# Patient Record
Sex: Male | Born: 1960
Health system: Southern US, Community
[De-identification: ages and names within clinical notes are randomized; demographics above are authoritative.]

## PROBLEM LIST (undated history)

## (undated) DIAGNOSIS — K921 Melena: Secondary | ICD-10-CM

## (undated) DIAGNOSIS — R918 Other nonspecific abnormal finding of lung field: Secondary | ICD-10-CM

## (undated) DIAGNOSIS — K219 Gastro-esophageal reflux disease without esophagitis: Secondary | ICD-10-CM

## (undated) DIAGNOSIS — R011 Cardiac murmur, unspecified: Secondary | ICD-10-CM

## (undated) DIAGNOSIS — R079 Chest pain, unspecified: Secondary | ICD-10-CM

## (undated) DIAGNOSIS — T7840XA Allergy, unspecified, initial encounter: Secondary | ICD-10-CM

## (undated) DIAGNOSIS — I10 Essential (primary) hypertension: Secondary | ICD-10-CM

## (undated) DIAGNOSIS — N182 Chronic kidney disease, stage 2 (mild): Secondary | ICD-10-CM

## (undated) HISTORY — DX: Other nonspecific abnormal finding of lung field: R91.8

## (undated) HISTORY — DX: Allergy, unspecified, initial encounter: T78.40XA

## (undated) HISTORY — DX: Chronic kidney disease, stage 2 (mild): N18.2

## (undated) HISTORY — DX: Chest pain, unspecified: R07.9

## (undated) HISTORY — DX: Cardiac murmur, unspecified: R01.1

## (undated) HISTORY — DX: Melena: K92.1

---

## 1998-05-24 HISTORY — PX: ANAL FISSURE REPAIR: SHX2312

## 2004-06-01 ENCOUNTER — Ambulatory Visit (HOSPITAL_COMMUNITY): Admission: RE | Admit: 2004-06-01 | Discharge: 2004-06-01 | Payer: Self-pay | Admitting: General Surgery

## 2004-06-01 ENCOUNTER — Ambulatory Visit (HOSPITAL_BASED_OUTPATIENT_CLINIC_OR_DEPARTMENT_OTHER): Admission: RE | Admit: 2004-06-01 | Discharge: 2004-06-01 | Payer: Self-pay | Admitting: General Surgery

## 2006-02-04 ENCOUNTER — Emergency Department: Payer: Self-pay | Admitting: Unknown Physician Specialty

## 2010-01-18 ENCOUNTER — Emergency Department: Payer: Self-pay | Admitting: Emergency Medicine

## 2011-04-23 ENCOUNTER — Emergency Department: Payer: Self-pay | Admitting: Emergency Medicine

## 2019-11-03 ENCOUNTER — Emergency Department (HOSPITAL_COMMUNITY): Payer: Commercial Managed Care - PPO

## 2019-11-03 ENCOUNTER — Emergency Department (HOSPITAL_COMMUNITY)
Admission: EM | Admit: 2019-11-03 | Discharge: 2019-11-04 | Disposition: A | Discharge status: Home / Self Care | Payer: Commercial Managed Care - PPO | Attending: Emergency Medicine | Admitting: Emergency Medicine

## 2019-11-03 ENCOUNTER — Encounter (HOSPITAL_COMMUNITY): Payer: Self-pay

## 2019-11-03 DIAGNOSIS — R202 Paresthesia of skin: Secondary | ICD-10-CM | POA: Insufficient documentation

## 2019-11-03 DIAGNOSIS — R072 Precordial pain: Secondary | ICD-10-CM

## 2019-11-03 DIAGNOSIS — R0602 Shortness of breath: Secondary | ICD-10-CM | POA: Insufficient documentation

## 2019-11-03 LAB — CBC
HCT: 53 % — ABNORMAL HIGH (ref 39.0–52.0)
Hemoglobin: 17.5 g/dL — ABNORMAL HIGH (ref 13.0–17.0)
MCH: 29.9 pg (ref 26.0–34.0)
MCHC: 33 g/dL (ref 30.0–36.0)
MCV: 90.4 fL (ref 80.0–100.0)
Platelets: 213 10*3/uL (ref 150–400)
RBC: 5.86 MIL/uL — ABNORMAL HIGH (ref 4.22–5.81)
RDW: 11.9 % (ref 11.5–15.5)
WBC: 6.9 10*3/uL (ref 4.0–10.5)
nRBC: 0 % (ref 0.0–0.2)

## 2019-11-03 LAB — BASIC METABOLIC PANEL
Anion gap: 9 (ref 5–15)
BUN: 10 mg/dL (ref 6–20)
CO2: 26 mmol/L (ref 22–32)
Calcium: 9.4 mg/dL (ref 8.9–10.3)
Chloride: 106 mmol/L (ref 98–111)
Creatinine, Ser: 1.35 mg/dL — ABNORMAL HIGH (ref 0.61–1.24)
GFR calc Af Amer: >60 mL/min (ref >60)
GFR calc non Af Amer: 57 mL/min — ABNORMAL LOW (ref >60)
Glucose, Bld: 98 mg/dL (ref 70–99)
Potassium: 4.2 mmol/L (ref 3.5–5.1)
Sodium: 141 mmol/L (ref 135–145)

## 2019-11-03 LAB — TROPONIN I (HIGH SENSITIVITY)
Troponin I (High Sensitivity): 13 ng/L (ref <18)
Troponin I (High Sensitivity): 22 ng/L — ABNORMAL HIGH (ref <18)
Troponin I (High Sensitivity): 18 ng/L — ABNORMAL HIGH (ref <18)

## 2019-11-03 NOTE — ED Triage Notes (Signed)
Pt c/o chest pain today while pt was working in the house. Pt states chest pain feels like pressure on the inside. Pt states left hand is tingling.

## 2019-11-04 ENCOUNTER — Ambulatory Visit: Payer: Commercial Managed Care - PPO | Admitting: Nurse Practitioner

## 2019-11-04 ENCOUNTER — Encounter: Payer: Self-pay | Admitting: Nurse Practitioner

## 2019-11-04 ENCOUNTER — Other Ambulatory Visit: Payer: Self-pay

## 2019-11-04 VITALS — BP 130/90 | HR 69 | Temp 98.6°F | Ht 66.35 in | Wt 193.0 lb

## 2019-11-04 DIAGNOSIS — Z125 Encounter for screening for malignant neoplasm of prostate: Secondary | ICD-10-CM

## 2019-11-04 DIAGNOSIS — Z6830 Body mass index (BMI) 30.0-30.9, adult: Secondary | ICD-10-CM

## 2019-11-04 DIAGNOSIS — Z1159 Encounter for screening for other viral diseases: Secondary | ICD-10-CM | POA: Insufficient documentation

## 2019-11-04 DIAGNOSIS — R079 Chest pain, unspecified: Secondary | ICD-10-CM | POA: Diagnosis not present

## 2019-11-04 DIAGNOSIS — Z720 Tobacco use: Secondary | ICD-10-CM | POA: Insufficient documentation

## 2019-11-04 DIAGNOSIS — R0602 Shortness of breath: Secondary | ICD-10-CM | POA: Insufficient documentation

## 2019-11-04 DIAGNOSIS — E669 Obesity, unspecified: Secondary | ICD-10-CM | POA: Insufficient documentation

## 2019-11-04 DIAGNOSIS — K219 Gastro-esophageal reflux disease without esophagitis: Secondary | ICD-10-CM | POA: Diagnosis not present

## 2019-11-04 DIAGNOSIS — K625 Hemorrhage of anus and rectum: Secondary | ICD-10-CM | POA: Insufficient documentation

## 2019-11-04 DIAGNOSIS — Z114 Encounter for screening for human immunodeficiency virus [HIV]: Secondary | ICD-10-CM | POA: Insufficient documentation

## 2019-11-04 DIAGNOSIS — Z0000 Encounter for general adult medical examination without abnormal findings: Secondary | ICD-10-CM | POA: Insufficient documentation

## 2019-11-04 MED ORDER — OMEPRAZOLE 20 MG PO CPDR: 20.0000 mg | DELAYED_RELEASE_CAPSULE | Freq: Every day | ORAL | 3 refills | Status: DC

## 2019-11-04 NOTE — Progress Notes (Signed)
New Patient Office Visit  Subjective:  Patient ID: Craig Finley, male    DOB: [birth year]  Age: 59 y.o. MRN: [id number]  CC:  Chief Complaint  Patient presents with  . New Patient (Initial Visit)    establish care    HPI Craig Finley is a 58 yo who presents to establish care with a primary care provider. He last had a routine exam with PCP over  20 years ago.  He recalls a history of GERD, anal fissure, mild intermittent constipation, and he was seen yesterday in the emergency department for new onset chest pain.  Chest pain: Yesterday, he was nailing a board and started to have diaphoresis, fatigue SOB and chest hurt. Kept nailing and it persisted. He knew this was unusual for him because he can build an entire deck and not break a sweat.  He was in the shade, but it was noon and very hot.  He was seen in the ED yesterday and the  EKG showed no acute abnormality. CXR was normal.  He had no significant climbing in troponin.  He was reporting minimal discomfort substernal area when he was working on his deck. He is non cigarette smoker. Patient was discharged home 81 mg aspirin and told to follow-up with Cardiology.  Today, he has no chest pain but left over vague tightness intermittent. No SOB but he is wearing a tight fitting  N95 mask and feels a little DOE when wearing this. Pulse ox 98%. Strong FH asthma, but he was never dx with it. 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   He takes Allegra D and Flonase in Feb to late August. He coughs when his allergies are active from spring until June. Summer he is good. It  returns in the fall and once a year had breathing treatment in the 1980's and 1990's.   GERD: He was dx years ago and took Nexium and Prilosec. He gets frequent HB and takes Tums x2 and in 2 hours later 2 more and it is gone. His HB is described as burning in epigastric and is non exertional.   BRBPR/mild  constipation: In military in 1982 he ad an anal ulcer and gave meds that shrunk it. He had anal fissure surgery 05/1998. He has seen BRBPR with B<M and drive trucks and holds off BM and gets a little constipated. No meds for  it. Eats cereal. Occasional rectal pain- today he had a BM- no constipation and soreness and a little bubble back there was present for a couple of weeks and resolved.   Past Medical History:  Diagnosis Date  . Allergy   . Blood in stool     Past Surgical History:  Procedure Laterality Date  . ANAL FISSURE REPAIR  05/1998   some type of anal fissure procedure    Family History  Problem Relation Age of Onset  . Heart disease Maternal Uncle   . Heart disease Maternal Uncle   . Arthritis Mother   . Asthma Mother   . Hypertension Mother   . Hearing loss Father   . Alcohol abuse Sister   . Cancer Sister   . Depression Sister   . Drug abuse Sister   . Early death Sister   . Heart attack Sister   . Asthma Daughter   . Asthma Son   . Alcohol abuse Maternal Grandmother   . Cancer Maternal Grandmother   . Cancer  Maternal Grandfather   . Hypertension Sister   . Miscarriages / Stillbirths Sister   . Asthma Sister   . Depression Sister     Social History   Socioeconomic History  . Marital status: Married    Spouse name: Not on file  . Number of children: Not on file  . Years of education: Not on file  . Highest education level: Some college, no degree  Occupational History  . Occupation: trucker  Tobacco Use  . Smoking status: Never Smoker  . Smokeless tobacco: Current User    Types: Snuff  . Tobacco comment: dipping x 25-30 years  Vaping Use  . Vaping Use: Never used  Substance and Sexual Activity  . Alcohol use: Yes    Comment: drinks 4-7 beers some days and none others. He drinks 24 12 oz can  beers per   . Drug use: Never  . Sexual activity: Yes  Other Topics Concern  . Not on file  Social History Narrative   Married with 5 kids and full  time trucker regional    Social Determinants of Health   Financial Resource Strain:   . Difficulty of Paying Living Expenses:   Food Insecurity:   . Worried About Programme researcher, broadcasting/film/video in the Last Year:   . Barista in the Last Year:   Transportation Needs:   . Freight forwarder (Medical):   Marland Kitchen Lack of Transportation (Non-Medical):   Physical Activity:   . Days of Exercise per Week:   . Minutes of Exercise per Session:   Stress:   . Feeling of Stress :   Social Connections:   . Frequency of Communication with Friends and Family:   . Frequency of Social Gatherings with Friends and Family:   . Attends Religious Services:   . Active Member of Clubs or Organizations:   . Attends Banker Meetings:   Marland Kitchen Marital Status:   Intimate Partner Violence:   . Fear of Current or Ex-Partner:   . Emotionally Abused:   Marland Kitchen Physically Abused:   . Sexually Abused:      Review of Systems Pertinent positives noted in history of present illness otherwise negative.  Objective:   Today's Vitals: BP 130/90 (BP Location: Left Arm, Patient Position: Sitting, Cuff Size: Normal)   Pulse 69   Temp 98.6 F (37 C) (Oral)   Ht 5' 6.35" (1.685 m)   Wt 193 lb (87.5 kg)   SpO2 98%   BMI 30.82 kg/m   Physical Exam Vitals reviewed.  Constitutional:      Appearance: Normal appearance.  HENT:     Head: Normocephalic and atraumatic.  Eyes:     Conjunctiva/sclera: Conjunctivae normal.     Pupils: Pupils are equal, round, and reactive to light.  Cardiovascular:     Rate and Rhythm: Normal rate and regular rhythm.     Pulses: Normal pulses.     Heart sounds: Normal heart sounds.  Pulmonary:     Effort: Pulmonary effort is normal.     Breath sounds: Normal breath sounds.  Abdominal:     Palpations: Abdomen is soft.     Tenderness: There is no abdominal tenderness.  Musculoskeletal:        General: Normal range of motion.     Cervical back: Normal range of motion and neck  supple.  Skin:    General: Skin is warm and dry.  Neurological:     General: No focal deficit present.  Mental Status: He is alert and oriented to person, place, and time.  Psychiatric:        Mood and Affect: Mood normal.        Behavior: Behavior normal.     CLINICAL DATA:  Chest pain.  EXAM: CHEST - 2 VIEW  COMPARISON:  Chest x-ray dated January 19, 2010.  FINDINGS: The heart size and mediastinal contours are within normal limits. Both lungs are clear. The visualized skeletal structures are unremarkable.  IMPRESSION: No active cardiopulmonary disease.   Electronically Signed   By: Obie Dredge M.D.   On: 11/03/2019 15:57   Assessment & Plan:   Problem List Items Addressed This Visit      Digestive   BRBPR (bright red blood per rectum) - Primary   Relevant Orders   Ambulatory referral to Gastroenterology   CBC with Differential/Platelet   Gastroesophageal reflux disease    Begin omeprazole 20 mg 1 daily 30 minutes before breakfast. I placed a referral into gastroenterology for rectal bleeding and heartburn.      Relevant Medications   omeprazole (PRILOSEC) 20 MG capsule   Other Relevant Orders   Ambulatory referral to Gastroenterology     Other   Chest pain    I placed referral into cardiology as recommended by emergency room physician for your chest pain and shortness of breath.      Relevant Orders   Ambulatory referral to Cardiology   Comprehensive metabolic panel   Lipid panel   SOB (shortness of breath)    I placed referral into cardiology as recommended by emergency room physician for your chest pain and shortness of breath.      Relevant Orders   Ambulatory referral to Cardiology   BMI 30.0-30.9,adult   Relevant Orders   Hemoglobin A1c   TSH   Encounter for HCV screening test for low risk patient   Relevant Orders   Hepatitis C antibody   Screening for HIV (human immunodeficiency virus)   Relevant Orders   HIV Antibody  (routine testing w rflx)   Screening for malignant neoplasm of prostate   Relevant Orders   PSA   Tobacco chew use    Please cut back on  Beer and chewing tobacco use. We can discuss Chantix in the next appt.           Outpatient Encounter Medications as of 11/04/2019  Medication Sig  . Fexofenadine-Pseudoephedrine (ALLEGRA-D PO) Take 1 tablet by mouth daily as needed (allergies).  Marland Kitchen omeprazole (PRILOSEC) 20 MG capsule Take 1 capsule (20 mg total) by mouth daily.   No facility-administered encounter medications on file as of 11/04/2019.  Please schedule a follow-up appointment for laboratory work and make a video visit to complete your physical exam.  I placed a referral into gastroenterology for rectal bleeding and heartburn. I placed referral into cardiology as recommended by emergency room physician for your chest pain and shortness of breath.  Please cut back on  Beer and chewing tobacco use. We can discuss Chantix in the next appt.   Begin omeprazole 20 mg 1 daily 30 minutes before breakfast.  1. Fasting labs when able 2. Video visit with me for CPE in 3 weeks   Follow-up: No follow-ups on file.  This visit occurred during the SARS-CoV-2 public health emergency.  Safety protocols were in place, including screening questions prior to the visit, additional usage of staff PPE, and extensive cleaning of exam room while observing appropriate contact time as indicated for disinfecting  solutions.   Denice Paradise, NP

## 2019-11-04 NOTE — Discharge Instructions (Signed)
Return for any new or worse chest pain.  Call cardiology in the morning to make an appointment for follow-up.  Work-up here tonight without any acute findings.  Recommend taking a baby aspirin a day.  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

## 2019-11-04 NOTE — ED Provider Notes (Signed)
[facility]-EMERGENCY DEPT Provider Note   CSN: [id number] Arrival date & time: 11/03/19  1437     History Chief Complaint  Patient presents with  . Chest Pain    Craig Finley is a 59 y.o. male.  Patient with the onset of substernal chest pain around 12 noon today.  Associated with no nausea or vomiting no new shortness of breath.  Has some pre-existing shortness of breath.  Little bit later in the day it moved a little bit to the left substernal area.  He has had since Sunday a little bit of numbness in his left hand.  No chest pain at that time.  No prior chest pain like this before.  Not made worse or better by movement.  No known cardiac risk factors.  The substernal chest discomfort is very minimal at this time.  Pain is been constant since it started at 12 noon.        History reviewed. No pertinent past medical history.  There are no problems to display for this patient.   History reviewed. No pertinent surgical history.     History reviewed. No pertinent family history.  Social History   Tobacco Use  . Smoking status: Not on file  Substance Use Topics  . Alcohol use: Not on file  . Drug use: Not on file    Home Medications Prior to Admission medications   Medication Sig Start Date End Date Taking? Authorizing Provider  Fexofenadine-Pseudoephedrine (ALLEGRA-D PO) Take 1 tablet by mouth daily as needed (allergies).   Yes [provider]    Allergies    Patient has no known allergies.  Review of Systems   Review of Systems  Constitutional: Negative for chills and fever.  HENT: Negative for rhinorrhea and sore throat.   Eyes: Negative for visual disturbance.  Respiratory: Negative for cough and shortness of breath.   Cardiovascular: Positive for chest pain. Negative for palpitations and leg swelling.  Gastrointestinal: Negative for abdominal pain, diarrhea, nausea and vomiting.  Genitourinary: Negative for  dysuria.  Musculoskeletal: Negative for back pain and neck pain.  Skin: Negative for rash.  Neurological: Negative for dizziness, light-headedness and headaches.  Hematological: Does not bruise/bleed easily.  Psychiatric/Behavioral: Negative for confusion.    Physical Exam Updated Vital Signs BP 125/82 (BP Location: Left Arm)   Pulse (!) 53   Temp 98.6 F (37 C)   Resp 15   Ht 1.676 m (5\' 6" )   Wt 86.2 kg   SpO2 97%   BMI 30.67 kg/m   Physical Exam Vitals and nursing note reviewed.  Constitutional:      Appearance: Normal appearance. He is well-developed.  HENT:     Head: Normocephalic and atraumatic.  Eyes:     Extraocular Movements: Extraocular movements intact.     Conjunctiva/sclera: Conjunctivae normal.     Pupils: Pupils are equal, round, and reactive to light.  Cardiovascular:     Rate and Rhythm: Normal rate and regular rhythm.     Heart sounds: No murmur heard.   Pulmonary:     Effort: Pulmonary effort is normal. No respiratory distress.     Breath sounds: Normal breath sounds.  Chest:     Chest wall: No tenderness.  Abdominal:     Palpations: Abdomen is soft.     Tenderness: There is no abdominal tenderness.  Musculoskeletal:        General: No swelling. Normal range of motion.     Cervical back: Normal  range of motion and neck supple.  Skin:    General: Skin is warm and dry.     Capillary Refill: Capillary refill takes less than 2 seconds.  Neurological:     General: No focal deficit present.     Mental Status: He is alert and oriented to person, place, and time.     Cranial Nerves: No cranial nerve deficit.     Sensory: No sensory deficit.     ED Results / Procedures / Treatments   Labs (all labs ordered are listed, but only abnormal results are displayed) Labs Reviewed  BASIC METABOLIC PANEL - Abnormal; Notable for the following components:      Result Value   Creatinine, Ser 1.35 (*)    GFR calc non Af Amer 57 (*)    All other components  within normal limits  CBC - Abnormal; Notable for the following components:   RBC 5.86 (*)    Hemoglobin 17.5 (*)    HCT 53.0 (*)    All other components within normal limits  TROPONIN I (HIGH SENSITIVITY) - Abnormal; Notable for the following components:   Troponin I (High Sensitivity) 22 (*)    All other components within normal limits  TROPONIN I (HIGH SENSITIVITY) - Abnormal; Notable for the following components:   Troponin I (High Sensitivity) 18 (*)    All other components within normal limits  TROPONIN I (HIGH SENSITIVITY)  TROPONIN I (HIGH SENSITIVITY)    EKG EKG Interpretation  Date/Time:  Wednesday November 03 2019 14:44:08 EDT Ventricular Rate:  90 PR Interval:  192 QRS Duration: 92 QT Interval:  358 QTC Calculation: 437 R Axis:   91 Text Interpretation: Normal sinus rhythm Rightward axis Borderline ECG Confirmed by Vanetta Mulders [id number]) on 11/03/2019 9:49:20 PM   Radiology DG Chest 2 View  Result Date: 11/03/2019 CLINICAL DATA:  Chest pain. EXAM: CHEST - 2 VIEW COMPARISON:  Chest x-ray dated January 19, 2010. FINDINGS: The heart size and mediastinal contours are within normal limits. Both lungs are clear. The visualized skeletal structures are unremarkable. IMPRESSION: No active cardiopulmonary disease. Electronically Signed   By: Obie Dredge M.D.   On: 11/03/2019 15:57    Procedures Procedures (including critical care time)  Medications Ordered in ED Medications - No data to display  ED Course  I have reviewed the triage vital signs and the nursing notes.  Pertinent labs & imaging results that were available during my care of the patient were reviewed by me and considered in my medical decision making (see chart for details).    MDM Rules/Calculators/A&P                           Work-up for the chest pain without any acute findings.  EKG without acute abnormalities.  Patient's initial troponin was 13 then the 2-hour repeat was 22 but then 110 2  hours later for third 1 was 18.  So no significant climbing.  Patient with minimal discomfort substernal area.  Patient without significant cardiac risk factors.  No diabetes no hypertension.  Patient is not a cigarette smoker.  I will have patient start a baby aspirin a day call cardiology for follow-up.  Returning for any new or worse symptoms.   Final Clinical Impression(s) / ED Diagnoses Final diagnoses:  Precordial pain    Rx / DC Orders ED Discharge Orders    None       Vanetta Mulders, MD 11/04/19 0004

## 2019-11-04 NOTE — Patient Instructions (Addendum)
Please schedule a follow-up appointment for laboratory work and make a video visit to complete your physical exam.  I placed a referral into gastroenterology for rectal bleeding and heartburn. I placed referral into cardiology as recommended by emergency room physician for your chest pain and shortness of breath.  Please cut back on  Beer and chewing tobacco use. We can discuss Chantix in the next appt.   Begin omeprazole 20mg  1 daily 30 minutes before breakfast.     Gastroesophageal Reflux Disease, Adult Gastroesophageal reflux (GER) happens when acid from the stomach flows up into the tube that connects the mouth and the stomach (esophagus). Normally, food travels down the esophagus and stays in the stomach to be digested. However, when a person has GER, food and stomach acid sometimes move back up into the esophagus. If this becomes a more serious problem, the person may be diagnosed with a disease called gastroesophageal reflux disease (GERD). GERD occurs when the reflux:  Happens often.  Causes frequent or severe symptoms.  Causes problems such as damage to the esophagus. When stomach acid comes in contact with the esophagus, the acid may cause soreness (inflammation) in the esophagus. Over time, GERD may create small holes (ulcers) in the lining of the esophagus. What are the causes? This condition is caused by a problem with the muscle between the esophagus and the stomach (lower esophageal sphincter, or LES). Normally, the LES muscle closes after food passes through the esophagus to the stomach. When the LES is weakened or abnormal, it does not close properly, and that allows food and stomach acid to go back up into the esophagus. The LES can be weakened by certain dietary substances, medicines, and medical conditions, including:  Tobacco use.  Pregnancy.  Having a hiatal hernia.  Alcohol use.  Certain foods and beverages, such as coffee, chocolate, onions, and  peppermint. What increases the risk? You are more likely to develop this condition if you:  Have an increased body weight.  Have a connective tissue disorder.  Use NSAID medicines. What are the signs or symptoms? Symptoms of this condition include:  Heartburn.  Difficult or painful swallowing.  The feeling of having a lump in the throat.  Abitter taste in the mouth.  Bad breath.  Having a large amount of saliva.  Having an upset or bloated stomach.  Belching.  Chest pain. Different conditions can cause chest pain. Make sure you see your health care provider if you experience chest pain.  Shortness of breath or wheezing.  Ongoing (chronic) cough or a night-time cough.  Wearing away of tooth enamel.  Weight loss. How is this diagnosed? Your health care provider will take a medical history and perform a physical exam. To determine if you have mild or severe GERD, your health care provider may also monitor how you respond to treatment. You may also have tests, including:  A test to examine your stomach and esophagus with a small camera (endoscopy).  A test thatmeasures the acidity level in your esophagus.  A test thatmeasures how much pressure is on your esophagus.  A barium swallow or modified barium swallow test to show the shape, size, and functioning of your esophagus. How is this treated? The goal of treatment is to help relieve your symptoms and to prevent complications. Treatment for this condition may vary depending on how severe your symptoms are. Your health care provider may recommend:  Changes to your diet.  Medicine.  Surgery. Follow these instructions at home: Eating and  drinking   Follow a diet as recommended by your health care provider. This may involve avoiding foods and drinks such as: ? Coffee and tea (with or without caffeine). ? Drinks that containalcohol. ? Energy drinks and sports drinks. ? Carbonated drinks or sodas. ? Chocolate  and cocoa. ? Peppermint and mint flavorings. ? Garlic and onions. ? Horseradish. ? Spicy and acidic foods, including peppers, chili powder, curry powder, vinegar, hot sauces, and barbecue sauce. ? Citrus fruit juices and citrus fruits, such as oranges, lemons, and limes. ? Tomato-based foods, such as red sauce, chili, salsa, and pizza with red sauce. ? Fried and fatty foods, such as donuts, french fries, potato chips, and high-fat dressings. ? High-fat meats, such as hot dogs and fatty cuts of red and white meats, such as rib eye steak, sausage, ham, and bacon. ? High-fat dairy items, such as whole milk, butter, and cream cheese.  Eat small, frequent meals instead of large meals.  Avoid drinking large amounts of liquid with your meals.  Avoid eating meals during the 2-3 hours before bedtime.  Avoid lying down right after you eat.  Do not exercise right after you eat. Lifestyle   Do not use any products that contain nicotine or tobacco, such as cigarettes, e-cigarettes, and chewing tobacco. If you need help quitting, ask your health care provider.  Try to reduce your stress by using methods such as yoga or meditation. If you need help reducing stress, ask your health care provider.  If you are overweight, reduce your weight to an amount that is healthy for you. Ask your health care provider for guidance about a safe weight loss goal. General instructions  Pay attention to any changes in your symptoms.  Take over-the-counter and prescription medicines only as told by your health care provider. Do not take aspirin, ibuprofen, or other NSAIDs unless your health care provider told you to do so.  Wear loose-fitting clothing. Do not wear anything tight around your waist that causes pressure on your abdomen.  Raise (elevate) the head of your bed about 6 inches (15 cm).  Avoid bending over if this makes your symptoms worse.  Keep all follow-up visits as told by your health care  provider. This is important. Contact a health care provider if:  You have: ? New symptoms. ? Unexplained weight loss. ? Difficulty swallowing or it hurts to swallow. ? Wheezing or a persistent cough. ? A hoarse voice.  Your symptoms do not improve with treatment. Get help right away if you:  Have pain in your arms, neck, jaw, teeth, or back.  Feel sweaty, dizzy, or light-headed.  Have chest pain or shortness of breath.  Vomit and your vomit looks like blood or coffee grounds.  Faint.  Have stool that is bloody or black.  Cannot swallow, drink, or eat. Summary  Gastroesophageal reflux happens when acid from the stomach flows up into the esophagus. GERD is a disease in which the reflux happens often, causes frequent or severe symptoms, or causes problems such as damage to the esophagus.  Treatment for this condition may vary depending on how severe your symptoms are. Your health care provider may recommend diet and lifestyle changes, medicine, or surgery.  Contact a health care provider if you have new or worsening symptoms.  Take over-the-counter and prescription medicines only as told by your health care provider. Do not take aspirin, ibuprofen, or other NSAIDs unless your health care provider told you to do so.  Keep all follow-up  visits as told by your health care provider. This is important. This information is not intended to replace advice given to you by your health care provider. Make sure you discuss any questions you have with your health care provider. Document Revised: 09/17/2017 Document Reviewed: 09/17/2017 Elsevier Patient Education  2020 [employer]  Smokeless Tobacco Information, Adult  Tobacco is a leafy plant that contains a chemical (nicotine). Nicotine affects your brain and causes you to become addicted to it. Smokeless tobacco is tobacco that you put in your mouth instead of smoking it. It may also be called chewing tobacco or snuff. Smokeless  tobacco is made from the leaves of tobacco plants and comes in many forms, such as:  Loose, dry leaves.  Plugs or twists.  Moist pouches.  Dissolving lozenges or strips. Chewing, sucking, or holding the tobacco in your mouth causes your mouth to make more saliva. The saliva mixes with the tobacco, which you swallow or spit out. Using tobacco is harmful to your health. How can smokeless tobacco affect me? All forms of tobacco contain many chemicals that can harm every organ in the body. Using smokeless tobacco increases your risk for:  Cancer. Tobacco use is one of the leading causes of cancer. Smokeless tobacco is linked to cancer of the mouth, esophagus, and pancreas.  Other long-term health problems, including high blood pressure, heart disease, and stroke.  Addiction.  Pregnancy complications, if this applies. Pregnant women who use smokeless tobacco are more likely to have a miscarriage or deliver a baby too early.  Mouth and dental problems, such as: ? Bad breath. ? Teeth that look yellow or brown. ? Mouth sores. ? Cracking and bleeding lips. ? Gum recession, gum disease, or tooth decay. ? Lesions on the soft tissues of your mouth (leukoplakia). The benefits of not using smokeless tobacco include:  A healthy mind and body.  Saving money. You avoid the cost of buying tobacco and the cost of treating illnesses that are caused by tobacco. What actions can I take to quit using tobacco? Ask your health care provider for help to quit using smokeless tobacco. This may involve treatment. These tips may also help you quit:  Pick a date to quit. Set a date within the next two weeks. This gives you time to prepare.  Write down the reasons why you are quitting. Keep this list in places where you will see it often, such as on your bathroom mirror or in your car or wallet.  Identify the people, places, things, and activities that make you want to use smokeless tobacco (triggers).  Avoid them.  Get rid of any tobacco you have and remove any tobacco smells. To do this: ? Throw away all containers of tobacco at home, at work, and in your car. ? Throw away any other items that you use regularly when you chew tobacco. ? Clean your car and make sure to remove all tobacco-related items. ? Clean your home, including curtains and carpets.  Tell your family and friends that you are quitting. Having support can make quitting easier.  Chew sugarless gum or sunflower seeds when you want to use smokeless tobacco.  Stay positive. Be prepared for cravings. It is common to slip up when you first quit, so take it one day at a time.  Stay busy and take care of your body. Get plenty of exercise. Drink enough water to keep your urine pale yellow.  Keep track of how many days have passed since you  quit. Remembering how long and hard you have worked to quit can help you avoid using smokeless tobacco again. Where to find support Ask your health care provider if there is a local support group for quitting smokeless tobacco. Where to find more information You can learn more about the risks of using smokeless tobacco and the benefits of quitting from these sources:  American Cancer Society: www.cancer.org  National Cancer Institute: www.cancer.gov  Centers for Disease Control and Prevention: [employer].br Contact a health care provider if you have:  Trouble quitting smokeless tobacco use on your own.  White or other discolored patches in your mouth.  Difficulty swallowing.  A change in your voice.  Unexplained weight loss.  Stomach pain, nausea, or vomiting. Summary  Smokeless tobacco contains many different chemicals that are known to cause cancer.  Nicotine is an addictive chemical in smokeless tobacco that affects your brain.  The benefits of not using smokeless tobacco include having a healthy mind and body and saving money.  Tell your family and friends that you are  quitting. Having support can make quitting easier.  Ask your health care provider for help quitting smokeless tobacco. This may involve treatment. This information is not intended to replace advice given to you by your health care provider. Make sure you discuss any questions you have with your health care provider. Document Revised: 12/04/2018 Document Reviewed: 05/18/2018 Elsevier Patient Education  2020 [employer] Inc.  Nonspecific Chest Pain, Adult Chest pain can be caused by many different conditions. It can be caused by a condition that is life-threatening and requires treatment right away. It can also be caused by something that is not life-threatening. If you have chest pain, it can be hard to know the difference, so it is important to get help right away to make sure that you do not have a serious condition. Some life-threatening causes of chest pain include:  Heart attack.  A tear in the body's main blood vessel (aortic dissection).  Inflammation around your heart (pericarditis).  A problem in the lungs, such as a blood clot (pulmonary embolism) or a collapsed lung (pneumothorax). Some non life-threatening causes of chest pain include:  Heartburn.  Anxiety or stress.  Damage to the bones, muscles, and cartilage that make up your chest wall.  Pneumonia or bronchitis.  Shingles infection (varicella-zoster virus). Chest pain can feel like:  Pain or discomfort on the surface of your chest or deep in your chest.  Crushing, pressure, aching, or squeezing pain.  Burning or tingling.  Dull or sharp pain that is worse when you move, cough, or take a deep breath.  Pain or discomfort that is also felt in your back, neck, jaw, shoulder, or arm, or pain that spreads to any of these areas. Your chest pain may come and go. It may also be constant. Your health care provider will do lab tests and other studies to find the cause of your pain. Treatment will depend on the cause of your  chest pain. Follow these instructions at home: Medicines  Take over-the-counter and prescription medicines only as told by your health care provider.  If you were prescribed an antibiotic, take it as told by your health care provider. Do not stop taking the antibiotic even if you start to feel better. Lifestyle   Rest as directed by your health care provider.  Do not use any products that contain nicotine or tobacco, such as cigarettes and e-cigarettes. If you need help quitting, ask your health care provider.  Do not drink alcohol.  Make healthy lifestyle choices as recommended. These may include: ? Getting regular exercise. Ask your health care provider to suggest some activities that are safe for you. ? Eating a heart-healthy diet. This includes plenty of fresh fruits and vegetables, whole grains, low-fat (lean) protein, and low-fat dairy products. A dietitian can help you find healthy eating options. ? Maintaining a healthy weight. ? Managing any other health conditions you have, such as high blood pressure (hypertension) or diabetes. ? Reducing stress, such as with yoga or relaxation techniques. General instructions  Pay attention to any changes in your symptoms. Tell your health care provider about them or any new symptoms.  Avoid any activities that cause chest pain.  Keep all follow-up visits as told by your health care provider. This is important. This includes visits for any further testing if your chest pain does not go away. Contact a health care provider if:  Your chest pain does not go away.  You feel depressed.  You have a fever. Get help right away if:  Your chest pain gets worse.  You have a cough that gets worse, or you cough up blood.  You have severe pain in your abdomen.  You faint.  You have sudden, unexplained chest discomfort.  You have sudden, unexplained discomfort in your arms, back, neck, or jaw.  You have shortness of breath at any  time.  You suddenly start to sweat, or your skin gets clammy.  You feel nausea or you vomit.  You suddenly feel lightheaded or dizzy.  You have severe weakness, or unexplained weakness or fatigue.  Your heart begins to beat quickly, or it feels like it is skipping beats. These symptoms may represent a serious problem that is an emergency. Do not wait to see if the symptoms will go away. Get medical help right away. Call your local emergency services (911 in the [place] Do not drive yourself to the hospital. Summary  Chest pain can be caused by a condition that is serious and requires urgent treatment. It may also be caused by something that is not life-threatening.  If you have chest pain, it is very important to see your health care provider. Your health care provider may do lab tests and other studies to find the cause of your pain.  Follow your health care provider's instructions on taking medicines, making lifestyle changes, and getting emergency treatment if symptoms become worse.  Keep all follow-up visits as told by your health care provider. This includes visits for any further testing if your chest pain does not go away. This information is not intended to replace advice given to you by your health care provider. Make sure you discuss any questions you have with your health care provider. Document Revised: 09/11/2017 Document Reviewed: 09/11/2017 Elsevier Patient Education  2020 [employer]

## 2019-11-07 ENCOUNTER — Encounter: Payer: Self-pay | Admitting: Nurse Practitioner

## 2019-11-07 NOTE — Assessment & Plan Note (Signed)
Begin omeprazole 20 mg 1 daily 30 minutes before breakfast. I placed a referral into gastroenterology for rectal bleeding and heartburn.

## 2019-11-07 NOTE — Assessment & Plan Note (Signed)
I placed referral into cardiology as recommended by emergency room physician for your chest pain and shortness of breath. 

## 2019-11-07 NOTE — Assessment & Plan Note (Signed)
Please cut back on  Beer and chewing tobacco use. We can discuss Chantix in the next appt.

## 2019-11-07 NOTE — Assessment & Plan Note (Signed)
I placed referral into cardiology as recommended by emergency room physician for your chest pain and shortness of breath.

## 2019-11-09 ENCOUNTER — Ambulatory Visit: Payer: Commercial Managed Care - PPO | Admitting: Cardiovascular Disease

## 2019-11-18 ENCOUNTER — Other Ambulatory Visit (INDEPENDENT_AMBULATORY_CARE_PROVIDER_SITE_OTHER): Payer: Commercial Managed Care - PPO

## 2019-11-18 DIAGNOSIS — Z1159 Encounter for screening for other viral diseases: Secondary | ICD-10-CM

## 2019-11-18 DIAGNOSIS — Z125 Encounter for screening for malignant neoplasm of prostate: Secondary | ICD-10-CM

## 2019-11-18 DIAGNOSIS — R079 Chest pain, unspecified: Secondary | ICD-10-CM

## 2019-11-18 DIAGNOSIS — K625 Hemorrhage of anus and rectum: Secondary | ICD-10-CM

## 2019-11-18 DIAGNOSIS — Z6830 Body mass index (BMI) 30.0-30.9, adult: Secondary | ICD-10-CM

## 2019-11-18 DIAGNOSIS — Z114 Encounter for screening for human immunodeficiency virus [HIV]: Secondary | ICD-10-CM

## 2019-11-18 LAB — CBC WITH DIFFERENTIAL/PLATELET
Basophils Absolute: 0 10*3/uL (ref 0.0–0.1)
Basophils Relative: 0.6 % (ref 0.0–3.0)
Eosinophils Absolute: 0.4 10*3/uL (ref 0.0–0.7)
Eosinophils Relative: 8.5 % — ABNORMAL HIGH (ref 0.0–5.0)
HCT: 45.4 % (ref 39.0–52.0)
Hemoglobin: 15.6 g/dL (ref 13.0–17.0)
Lymphocytes Relative: 38.6 % (ref 12.0–46.0)
Lymphs Abs: 2 10*3/uL (ref 0.7–4.0)
MCHC: 34.3 g/dL (ref 30.0–36.0)
MCV: 87.9 fl (ref 78.0–100.0)
Monocytes Absolute: 0.4 10*3/uL (ref 0.1–1.0)
Monocytes Relative: 7.2 % (ref 3.0–12.0)
Neutro Abs: 2.3 10*3/uL (ref 1.4–7.7)
Neutrophils Relative %: 45.1 % (ref 43.0–77.0)
Platelets: 207 10*3/uL (ref 150.0–400.0)
RBC: 5.17 Mil/uL (ref 4.22–5.81)
RDW: 12.1 % (ref 11.5–15.5)
WBC: 5.2 10*3/uL (ref 4.0–10.5)

## 2019-11-18 LAB — COMPREHENSIVE METABOLIC PANEL
ALT: 29 U/L (ref 0–53)
AST: 30 U/L (ref 0–37)
Albumin: 4.1 g/dL (ref 3.5–5.2)
Alkaline Phosphatase: 37 U/L — ABNORMAL LOW (ref 39–117)
BUN: 19 mg/dL (ref 6–23)
CO2: 31 mEq/L (ref 19–32)
Calcium: 9.1 mg/dL (ref 8.4–10.5)
Chloride: 103 mEq/L (ref 96–112)
Creatinine, Ser: 1.51 mg/dL — ABNORMAL HIGH (ref 0.40–1.50)
GFR: 57.56 mL/min — ABNORMAL LOW (ref >60.00)
Glucose, Bld: 86 mg/dL (ref 70–99)
Potassium: 3.6 mEq/L (ref 3.5–5.1)
Sodium: 141 mEq/L (ref 135–145)
Total Bilirubin: 0.9 mg/dL (ref 0.2–1.2)
Total Protein: 6.8 g/dL (ref 6.0–8.3)

## 2019-11-18 LAB — LIPID PANEL
Cholesterol: 157 mg/dL (ref 0–200)
HDL: 42.4 mg/dL (ref >39.00)
LDL Cholesterol: 88 mg/dL (ref 0–99)
NonHDL: 114.43
Total CHOL/HDL Ratio: 4
Triglycerides: 134 mg/dL (ref 0.0–149.0)
VLDL: 26.8 mg/dL (ref 0.0–40.0)

## 2019-11-18 LAB — HEMOGLOBIN A1C: Hgb A1c MFr Bld: 5.5 % (ref 4.6–6.5)

## 2019-11-18 LAB — TSH: TSH: 2.13 u[IU]/mL (ref 0.35–4.50)

## 2019-11-18 LAB — PSA: PSA: 0.83 ng/mL (ref 0.10–4.00)

## 2019-11-19 ENCOUNTER — Telehealth: Payer: Self-pay

## 2019-11-19 LAB — HEPATITIS C ANTIBODY
Hepatitis C Ab: NONREACTIVE
SIGNAL TO CUT-OFF: 0.31 (ref <1.00)

## 2019-11-19 LAB — HIV ANTIBODY (ROUTINE TESTING W REFLEX): HIV 1&2 Ab, 4th Generation: NONREACTIVE

## 2019-11-19 NOTE — Telephone Encounter (Signed)
lmtcb for lab results

## 2019-11-19 NOTE — Telephone Encounter (Signed)
Patient aware of lab results.

## 2019-11-25 ENCOUNTER — Telehealth (INDEPENDENT_AMBULATORY_CARE_PROVIDER_SITE_OTHER): Payer: Commercial Managed Care - PPO | Admitting: Nurse Practitioner

## 2019-11-25 ENCOUNTER — Other Ambulatory Visit: Payer: Self-pay

## 2019-11-25 VITALS — Ht 66.0 in | Wt 189.0 lb

## 2019-11-25 DIAGNOSIS — R7989 Other specified abnormal findings of blood chemistry: Secondary | ICD-10-CM

## 2019-11-25 DIAGNOSIS — R030 Elevated blood-pressure reading, without diagnosis of hypertension: Secondary | ICD-10-CM | POA: Diagnosis not present

## 2019-11-25 DIAGNOSIS — K219 Gastro-esophageal reflux disease without esophagitis: Secondary | ICD-10-CM

## 2019-11-25 DIAGNOSIS — Z720 Tobacco use: Secondary | ICD-10-CM

## 2019-11-25 DIAGNOSIS — K625 Hemorrhage of anus and rectum: Secondary | ICD-10-CM

## 2019-11-25 DIAGNOSIS — R079 Chest pain, unspecified: Secondary | ICD-10-CM

## 2019-11-25 DIAGNOSIS — N644 Mastodynia: Secondary | ICD-10-CM

## 2019-11-25 NOTE — Patient Instructions (Addendum)
Trial of Chantix. Pt was counseled on smoking cessation today for 5 minutes.  He was also counseled on the side effects of Chantix including nausea, dizziness, bad dreams and rarely suicide ideation.  He is aware and wishes to proceed.   Common side effects including rare risk of suicide ideation was discussed with the patient today.  Patient is instructed to go directly to the ED if this occurs.  We discussed that patient can continue to smoke for 1 week after starting chantix, but then must discontinue cigarettes.  He is also instructed to contact us prior to completion of the starter month pack for an Rx for the continuation month pack.  5 minutes spent with patient today on tobacco cessation counseling.   Check your BP when you are home and write it down. You can check 3 times a week - cal [place] with the report.  When he comes in to see the Cardiologist.   Please make office visit to check  left breast pain.  You are concerned because your uncle had breast cancer.   Smokeless Tobacco Information, Adult  Tobacco is a leafy plant that contains a chemical (nicotine). Nicotine affects your brain and causes you to become addicted to it. Smokeless tobacco is tobacco that you put in your mouth instead of smoking it. It may also be called chewing tobacco or snuff. Smokeless tobacco is made from the leaves of tobacco plants and comes in many forms, such as:  Loose, dry leaves.  Plugs or twists.  Moist pouches.  Dissolving lozenges or strips. Chewing, sucking, or holding the tobacco in your mouth causes your mouth to make more saliva. The saliva mixes with the tobacco, which you swallow or spit out. Using tobacco is harmful to your health. How can smokeless tobacco affect me? All forms of tobacco contain many chemicals that can harm every organ in the body. Using smokeless tobacco increases your risk for:  Cancer. Tobacco use is one of the leading causes of cancer. Smokeless tobacco is linked to  cancer of the mouth, esophagus, and pancreas.  Other long-term health problems, including high blood pressure, heart disease, and stroke.  Addiction.  Pregnancy complications, if this applies. Pregnant women who use smokeless tobacco are more likely to have a miscarriage or deliver a baby too early.  Mouth and dental problems, such as: ? Bad breath. ? Teeth that look yellow or brown. ? Mouth sores. ? Cracking and bleeding lips. ? Gum recession, gum disease, or tooth decay. ? Lesions on the soft tissues of your mouth (leukoplakia). The benefits of not using smokeless tobacco include:  A healthy mind and body.  Saving money. You avoid the cost of buying tobacco and the cost of treating illnesses that are caused by tobacco. What actions can I take to quit using tobacco? Ask your health care provider for help to quit using smokeless tobacco. This may involve treatment. These tips may also help you quit:  Pick a date to quit. Set a date within the next two weeks. This gives you time to prepare.  Write down the reasons why you are quitting. Keep this list in places where you will see it often, such as on your bathroom mirror or in your car or wallet.  Identify the people, places, things, and activities that make you want to use smokeless tobacco (triggers). Avoid them.  Get rid of any tobacco you have and remove any tobacco smells. To do this: ? Throw away all containers of tobacco  at home, at work, and in your car. ? Throw away any other items that you use regularly when you chew tobacco. ? Clean your car and make sure to remove all tobacco-related items. ? Clean your home, including curtains and carpets.  Tell your family and friends that you are quitting. Having support can make quitting easier.  Chew sugarless gum or sunflower seeds when you want to use smokeless tobacco.  Stay positive. Be prepared for cravings. It is common to slip up when you first quit, so take it one day at  a time.  Stay busy and take care of your body. Get plenty of exercise. Drink enough water to keep your urine pale yellow.  Keep track of how many days have passed since you quit. Remembering how long and hard you have worked to quit can help you avoid using smokeless tobacco again. Where to find support Ask your health care provider if there is a local support group for quitting smokeless tobacco. Where to find more information You can learn more about the risks of using smokeless tobacco and the benefits of quitting from these sources:  American Cancer Society: www.cancer.org  National Cancer Institute: www.cancer.gov  Centers for Disease Control and Prevention: www.DiningCalendar.de Contact a health care provider if you have:  Trouble quitting smokeless tobacco use on your own.  White or other discolored patches in your mouth.  Difficulty swallowing.  A change in your voice.  Unexplained weight loss.  Stomach pain, nausea, or vomiting. Summary  Smokeless tobacco contains many different chemicals that are known to cause cancer.  Nicotine is an addictive chemical in smokeless tobacco that affects your brain.  The benefits of not using smokeless tobacco include having a healthy mind and body and saving money.  Tell your family and friends that you are quitting. Having support can make quitting easier.  Ask your health care provider for help quitting smokeless tobacco. This may involve treatment. This information is not intended to replace advice given to you by your health care provider. Make sure you discuss any questions you have with your health care provider. Document Revised: 12/04/2018 Document Reviewed: 05/18/2018 Elsevier Patient Education  2020 [employer].  Steps to Quit Smoking Smoking tobacco is the leading cause of preventable death. It can affect almost every organ in the body. Smoking puts you and people around you at risk for many serious, long-lasting (chronic)  diseases. Quitting smoking can be hard, but it is one of the best things that you can do for your health. It is never too late to quit. How do I get ready to quit? When you decide to quit smoking, make a plan to help you succeed. Before you quit:  Pick a date to quit. Set a date within the next 2 weeks to give you time to prepare.  Write down the reasons why you are quitting. Keep this list in places where you will see it often.  Tell your family, friends, and co-workers that you are quitting. Their support is important.  Talk with your doctor about the choices that may help you quit.  Find out if your health insurance will pay for these treatments.  Know the people, places, things, and activities that make you want to smoke (triggers). Avoid them. What first steps can I take to quit smoking?  Throw away all cigarettes at home, at work, and in your car.  Throw away the things that you use when you smoke, such as ashtrays and lighters.  Clean your car. Make sure to empty the ashtray.  Clean your home, including curtains and carpets. What can I do to help me quit smoking? Talk with your doctor about taking medicines and seeing a counselor at the same time. You are more likely to succeed when you do both.  If you are pregnant or breastfeeding, talk with your doctor about counseling or other ways to quit smoking. Do not take medicine to help you quit smoking unless your doctor tells you to do so. To quit smoking: Quit right away  Quit smoking totally, instead of slowly cutting back on how much you smoke over a period of time.  Go to counseling. You are more likely to quit if you go to counseling sessions regularly. Take medicine You may take medicines to help you quit. Some medicines need a prescription, and some you can buy over-the-counter. Some medicines may contain a drug called nicotine to replace the nicotine in cigarettes. Medicines may:  Help you to stop having the desire to  smoke (cravings).  Help to stop the problems that come when you stop smoking (withdrawal symptoms). Your doctor may ask you to use:  Nicotine patches, gum, or lozenges.  Nicotine inhalers or sprays.  Non-nicotine medicine that is taken by mouth. Find resources Find resources and other ways to help you quit smoking and remain smoke-free after you quit. These resources are most helpful when you use them often. They include:  Online chats with a Veterinary surgeon.  Phone quitlines.  Printed Materials engineer.  Support groups or group counseling.  Text messaging programs.  Mobile phone apps. Use apps on your mobile phone or tablet that can help you stick to your quit plan. There are many free apps for mobile phones and tablets as well as websites. Examples include Quit Guide from the [employer] and smokefree.gov  What things can I do to make it easier to quit?   Talk to your family and friends. Ask them to support and encourage you.  Call a phone quitline (1-800-QUIT-NOW), reach out to support groups, or work with a Veterinary surgeon.  Ask people who smoke to not smoke around you.  Avoid places that make you want to smoke, such as: ? Bars. ? Parties. ? Smoke-break areas at work.  Spend time with people who do not smoke.  Lower the stress in your life. Stress can make you want to smoke. Try these things to help your stress: ? Getting regular exercise. ? Doing deep-breathing exercises. ? Doing yoga. ? Meditating. ? Doing a body scan. To do this, close your eyes, focus on one area of your body at a time from head to toe. Notice which parts of your body are tense. Try to relax the muscles in those areas. How will I feel when I quit smoking? Day 1 to 3 weeks Within the first 24 hours, you may start to have some problems that come from quitting tobacco. These problems are very bad 2-3 days after you quit, but they do not often last for more than 2-3 weeks. You may get these symptoms:  Mood  swings.  Feeling restless, nervous, angry, or annoyed.  Trouble concentrating.  Dizziness.  Strong desire for high-sugar foods and nicotine.  Weight gain.  Trouble pooping (constipation).  Feeling like you may vomit (nausea).  Coughing or a sore throat.  Changes in how the medicines that you take for other issues work in your body.  Depression.  Trouble sleeping (insomnia). Week 3 and afterward After the  first 2-3 weeks of quitting, you may start to notice more positive results, such as:  Better sense of smell and taste.  Less coughing and sore throat.  Slower heart rate.  Lower blood pressure.  Clearer skin.  Better breathing.  Fewer sick days. Quitting smoking can be hard. Do not give up if you fail the first time. Some people need to try a few times before they succeed. Do your best to stick to your quit plan, and talk with your doctor if you have any questions or concerns. Summary  Smoking tobacco is the leading cause of preventable death. Quitting smoking can be hard, but it is one of the best things that you can do for your health.  When you decide to quit smoking, make a plan to help you succeed.  Quit smoking right away, not slowly over a period of time.  When you start quitting, seek help from your doctor, family, or friends. This information is not intended to replace advice given to you by your health care provider. Make sure you discuss any questions you have with your health care provider. Document Revised: 12/04/2018 Document Reviewed: 05/30/2018 Elsevier Patient Education  2020 [employer]

## 2019-11-25 NOTE — Progress Notes (Signed)
Virtual Visit via virtual note  This visit type was conducted due to national recommendations for restrictions regarding the COVID-19 pandemic (e.g. social distancing).  This format is felt to be most appropriate for this patient at this time.  All issues noted in this document were discussed and addressed.  No physical exam was performed (except for noted visual exam findings with Video Visits).   I connected with@ on 11/27/19 at  8:30 AM EDT by a video enabled telemedicine application or telephone and verified that I am speaking with the correct person using two identifiers. 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 I also discussed with the patient that there may be a patient responsible charge related to this service. The patient expressed understanding and agreed to proceed.  Reason for visit: Patient reports pain in spot in his left breast area.  He is worried because his uncle had breast cancer.  HPI: This 59 year old male establish care on 11/04/2019 with after ER visit for chest pain and he has cardiology appointment on 12/06/19.  He was advised by the ED provider to take aspirin 81 mg daily.  He has been advised to cut back on chewing tobacco.  He was advised to monitor his blood pressure at home.  He does have a BP arm cuff, but has forgotten to check his blood pressure.  He will try to remember.  He has had no further chest pain, shortness of breath or DOE.    BP Readings from Last 3 Encounters:  11/04/19 130/90  11/04/19 (!) 145/89     Chemistry      Component Value Date/Time   NA 141 11/18/2019 0809   K 3.6 11/18/2019 0809   CL 103 11/18/2019 0809   CO2 31 11/18/2019 0809   BUN 19 11/18/2019 0809   CREATININE 1.51 (H) 11/18/2019  0809      Component Value Date/Time   CALCIUM 9.1 11/18/2019 0809   ALKPHOS 37 (L) 11/18/2019 0809   AST 30 11/18/2019 0809   ALT 29 11/18/2019 0809   BILITOT 0.9 11/18/2019 0809     He also reported allergic rhinitis which is well controlled.    GERD which is well controlled on PPI.  History of bright red blood with bowel movement and tendency towards constipation.  He has a GI appointment coming up for EGD/colonoscopy.  He has not passing blood at this time, stools are soft, staying hydrated.  Left breast tenderness.  Patient found a spot on his left breast that is tender.  He cannot give any further information will need office visit for further evaluation and management.  ROS: See pertinent positives and negatives per HPI.  Past Medical History:  Diagnosis Date  . Allergy   . Blood in stool     Past Surgical History:  Procedure Laterality Date  . ANAL FISSURE REPAIR  05/1998   some type of anal fissure procedure    Family History  Problem Relation Age of Onset  . Heart disease Maternal Uncle   . Heart disease Maternal Uncle   . Arthritis Mother   . Asthma Mother   . Hypertension Mother   . Hearing loss Father   . Alcohol abuse Sister   . Cancer Sister   . Depression Sister   . Drug abuse Sister   .  Early death Sister   . Heart attack Sister   . Asthma Daughter   . Asthma Son   . Alcohol abuse Maternal Grandmother   . Cancer Maternal Grandmother   . Cancer Maternal Grandfather   . Hypertension Sister   . Miscarriages / Stillbirths Sister   . Asthma Sister   . Depression Sister     SOCIAL HX: Tobacco : dipping. He cannot stop on his own. He does <1 can per day. He never smoked cigarettes. Drinks a  12 oz beer per day.    Current Outpatient Medications:  .  Fexofenadine-Pseudoephedrine (ALLEGRA-D PO), Take 1 tablet by mouth daily as needed (allergies)., Disp: , Rfl:  .  omeprazole (PRILOSEC) 20 MG capsule, Take 1 capsule (20 mg total) by mouth daily.,  Disp: 30 capsule, Rfl: 3  EXAM:  VITALS per patient if applicable:  GENERAL: alert, oriented, appears well and in no acute distress  HEENT: atraumatic, conjunctiva clear, no obvious abnormalities on inspection of external nose and ears  NECK: normal movements of the head and neck  LUNGS: on inspection no signs of respiratory distress, breathing rate appears normal, no obvious gross SOB, gasping or wheezing  CV: no obvious cyanosis  MS: moves all visible extremities without noticeable abnormality  PSYCH/NEURO: pleasant and cooperative, no obvious depression or anxiety, speech and thought processing grossly intact  ASSESSMENT AND PLAN:  Discussed the following assessment and plan:  Elevated serum creatinine - Plan: Basic metabolic panel, Microalbumin / creatinine urine ratio, Urinalysis, Routine w reflex microscopic  Elevated BP without diagnosis of hypertension - Plan: Basic metabolic panel, Microalbumin / creatinine urine ratio, Urinalysis, Routine w reflex microscopic  BRBPR (bright red blood per rectum)  Gastroesophageal reflux disease, unspecified whether esophagitis present  Tobacco chew use  Chest pain, unspecified type  Breast pain, left  No problem-specific Assessment & Plan notes found for this encounter.  Await cardiology appointment and clearance for luminal evaluation.   He has appointment with gastroenterology for.  He is on a PPI at this time.  He was placed on low-dose aspirin by the ER physician.  He has no upper GI complaints.   Monitor blood pressure, advised to check his blood pressure and call us back  He also had slight elevation in creatinine, will add a urinalysis, micro albumin to look for proteinuria, and a Bmet for when he comes into the office for Cardiology visit. Or, when he comes in for breast pain eva;aution in person.   I discussed the assessment and treatment plan with the patient. The patient was provided an opportunity to ask questions  and all were answered. The patient agreed with the plan and demonstrated an understanding of the instructions.   The patient was advised to call back or seek an in-person evaluation if the symptoms worsen or if the condition fails to improve as anticipated.  I provided 25 minutes of non-face-to-face time during this encounter.  Amedeo Kinsman, NP Adult Nurse Practitioner [facility] [employer] [phone]

## 2019-11-27 ENCOUNTER — Telehealth: Payer: Self-pay | Admitting: Nurse Practitioner

## 2019-11-27 ENCOUNTER — Encounter: Payer: Self-pay | Admitting: Nurse Practitioner

## 2019-11-27 DIAGNOSIS — R030 Elevated blood-pressure reading, without diagnosis of hypertension: Secondary | ICD-10-CM | POA: Insufficient documentation

## 2019-11-27 DIAGNOSIS — N644 Mastodynia: Secondary | ICD-10-CM | POA: Insufficient documentation

## 2019-11-27 DIAGNOSIS — R7989 Other specified abnormal findings of blood chemistry: Secondary | ICD-10-CM | POA: Insufficient documentation

## 2019-11-27 NOTE — Telephone Encounter (Signed)
He needs a f/up visit to check left breast pain mentioned on video visit.

## 2019-11-30 NOTE — Telephone Encounter (Signed)
Can you call and schedule him an appt to follow up?

## 2019-12-01 NOTE — Telephone Encounter (Signed)
Patient is scheduled for 12/09/19 at 10:30 am for follow up

## 2019-12-06 ENCOUNTER — Ambulatory Visit: Payer: Commercial Managed Care - PPO | Admitting: Cardiology

## 2019-12-06 ENCOUNTER — Other Ambulatory Visit: Payer: Self-pay

## 2019-12-06 ENCOUNTER — Encounter: Payer: Self-pay | Admitting: Cardiology

## 2019-12-06 VITALS — BP 140/90 | HR 48 | Ht 66.0 in | Wt 194.0 lb

## 2019-12-06 DIAGNOSIS — R072 Precordial pain: Secondary | ICD-10-CM | POA: Diagnosis not present

## 2019-12-06 DIAGNOSIS — Z720 Tobacco use: Secondary | ICD-10-CM

## 2019-12-06 DIAGNOSIS — I209 Angina pectoris, unspecified: Secondary | ICD-10-CM | POA: Diagnosis not present

## 2019-12-06 NOTE — Progress Notes (Signed)
Cardiology Office Note:    Date:  12/06/2019   ID:  Craig Finley, DOB [birth year], MRN [id number]  PCP:  Marval Regal, NP  [facility] Cardiologist:  Kate Sable, MD  [place] Electrophysiologist:  None   Referring MD: Marval Regal, NP   Chief Complaint  Patient presents with  . OTHER    Chest pain, sob, left arm tingling/numbness. Meds reviewed verbally with pt.   Craig Finley is a 59 y.o. male who is being seen today for the evaluation of chest pain at the request of Marval Regal, NP.   History of Present Illness:    Craig Finley is a 59 y.o. male with a hx of tobacco use, presenting with chest pain.  Patient states having an episode of chest discomfort last month while he was working on his porch.  Describes symptoms as chest tightness, rates it as 5 out of 10 in severity.  Symptoms of chest discomfort also associated with shortness of breath.  His son is an Therapist, sports who advised him to come to the emergency room.  He drove himself to the ED. Presented to the ED on 11/04/2019 due to chest pain.  EKG and troponins were unrevealing.  Patient started on baby aspirin and advised to follow-up with cardiology.  Since the ED visit, had another episode of chest discomfort a week ago while lifting up the ramp of his truck.  Patient is a Administrator.  He denies any history of heart disease.  Has extended family of heart disease with 2 uncles on his mother side having MIs.  Denies palpitations, diaphoresis, presyncope or syncope.  Past Medical History:  Diagnosis Date  . Allergy   . Blood in stool   . Heart murmur    as child    Past Surgical History:  Procedure Laterality Date  . ANAL FISSURE REPAIR  05/1998   some type of anal fissure procedure    Current Medications: Current Meds  Medication Sig  . Acetaminophen (TYLENOL PO) Take by mouth as needed.  Marland Kitchen aspirin EC 81 MG tablet Take 81 mg by mouth daily. Swallow whole.    Marland Kitchen Fexofenadine-Pseudoephedrine (ALLEGRA-D PO) Take 1 tablet by mouth daily as needed (allergies).  Marland Kitchen omeprazole (PRILOSEC) 20 MG capsule Take 1 capsule (20 mg total) by mouth daily.     Allergies:   Patient has no known allergies.   Social History   Socioeconomic History  . Marital status: Married    Spouse name: Not on file  . Number of children: Not on file  . Years of education: Not on file  . Highest education level: Some college, no degree  Occupational History  . Occupation: trucker  Tobacco Use  . Smoking status: Never Smoker  . Smokeless tobacco: Current User    Types: Snuff  . Tobacco comment: dipping x 25-30 years  Vaping Use  . Vaping Use: Never used  Substance and Sexual Activity  . Alcohol use: Yes    Comment: drinks 4-7 beers some days and none others. He drinks 24 12 oz can  beers per   . Drug use: Never  . Sexual activity: Yes  Other Topics Concern  . 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 Difficulty of Paying Living Expenses: Not on file  Food Insecurity:   . Worried About  Running Out of Food in the Last Year: Not on file  . Ran Out of Food in the Last Year: Not on file  Transportation Needs:   . Lack of Transportation (Medical): Not on file  . Lack of Transportation (Non-Medical): Not on file  Physical Activity:   . Days of Exercise per Week: Not on file  . Minutes of Exercise per Session: Not on file  Stress:   . Feeling of Stress : Not on file  Social Connections:   . Frequency of Communication with Friends and Family: Not on file  . Frequency of Social Gatherings with Friends and Family: Not on file  . Attends Religious Services: Not on file  . Active Member of Clubs or Organizations: Not on file  . Attends Archivist Meetings: Not on file  . Marital Status: Not on file     Family History: The patient's family  history includes Alcohol abuse in his maternal grandmother and sister; Arthritis in his mother; Asthma in his daughter, mother, sister, and son; Cancer in his maternal grandfather, maternal grandmother, and sister; Depression in his sister and sister; Drug abuse in his sister; Early death in his sister; Hearing loss in his father; Heart attack in his sister; Heart disease in his maternal uncle and maternal uncle; Hypertension in his mother and sister; Miscarriages / [place] in his sister.  ROS:   Please see the history of present illness.     All other systems reviewed and are negative.  EKGs/Labs/Other Studies Reviewed:    The following studies were reviewed today:   EKG:  EKG is  ordered today.  The ekg ordered today demonstrates sinus bradycardia, otherwise normal ECG.  Recent Labs: 11/18/2019: ALT 29; BUN 19; Creatinine, Ser 1.51; Hemoglobin 15.6; Platelets 207.0; Potassium 3.6; Sodium 141; TSH 2.13  Recent Lipid Panel    Component Value Date/Time   CHOL 157 11/18/2019 0809   TRIG 134.0 11/18/2019 0809   HDL 42.40 11/18/2019 0809   CHOLHDL 4 11/18/2019 0809   VLDL 26.8 11/18/2019 0809   LDLCALC 88 11/18/2019 0809    Physical Exam:    VS:  BP 140/90 (BP Location: Right Arm, Patient Position: Sitting, Cuff Size: Normal)   Pulse (!) 48   Ht 5' 6"  (1.676 m)   Wt 194 lb (88 kg)   SpO2 98%   BMI 31.31 kg/m     Wt Readings from Last 3 Encounters:  12/06/19 194 lb (88 kg)  11/25/19 189 lb (85.7 kg)  11/04/19 193 lb (87.5 kg)     GEN:  Well nourished, well developed in no acute distress HEENT: Normal NECK: No JVD; No carotid bruits LYMPHATICS: No lymphadenopathy CARDIAC: Regular, bradycardic, no murmurs, rubs, gallops RESPIRATORY:  Clear to auscultation without rales, wheezing or rhonchi  ABDOMEN: Soft, non-tender, non-distended MUSCULOSKELETAL:  No edema; No deformity  SKIN: Warm and dry NEUROLOGIC:  Alert and oriented x 3 PSYCHIATRIC:  Normal affect   ASSESSMENT:     1. Angina pectoris ([place])   2. Tobacco chew use   3. Precordial pain    PLAN:    In order of problems listed above:  1. Patient with history of chest pain consistent with angina pectoris.  He chews tobacco.  He is at low to intermediate risk for CAD.  As such a coronary CTA is optimal.  Plan to get a coronary CTA.  Also get echocardiogram to evaluate any cardiac dysfunction. 2. Patient currently chews tobacco, cessation advised.  Follow-up after echo  and coronary CTA.   Medication Adjustments/Labs and Tests Ordered: Current medicines are reviewed at length with the patient today.  Concerns regarding medicines are outlined above.  Orders Placed This Encounter  Procedures  . CT CORONARY MORPH W/CTA COR W/SCORE W/CA W/CM &/OR WO/CM  . CT CORONARY FRACTIONAL FLOW RESERVE DATA PREP  . CT CORONARY FRACTIONAL FLOW RESERVE FLUID ANALYSIS  . EKG 12-Lead  . ECHOCARDIOGRAM COMPLETE   No orders of the defined types were placed in this encounter.   Patient Instructions  Medication Instructions:  Your physician recommends that you continue on your current medications as directed. Please refer to the Current Medication list given to you today.  *If you need a refill on your cardiac medications before your next appointment, please call your pharmacy*   Lab Work: None Ordered If you have labs (blood work) drawn today and your tests are completely normal, you will receive your results only by: Marland Kitchen MyChart Message (if you have MyChart) OR . A paper copy in the mail If you have any lab test that is abnormal or we need to change your treatment, we will call you to review the results.   Testing/Procedures:  1.  Your physician has requested that you have an echocardiogram. Echocardiography is a painless test that uses sound waves to create images of your heart. It provides your doctor with information about the size and shape of your heart and how well your heart's chambers and valves are  working. This procedure takes approximately one hour. There are no restrictions for this procedure.  2.  Your physician has requested that you have cardiac CT. Cardiac computed tomography (CT) is a painless test that uses an x-ray machine to take clear, detailed pictures of your heart.     Your cardiac CT will be scheduled at one of the below locations:    [facility], [place] [address] [place], [place] [address] [phone]   At [facility], please arrive 15 mins early for check-in and test prep.    Please follow these instructions carefully (unless otherwise directed):   Hold all erectile dysfunction medications at least 3 days (72 hrs) prior to test.   On the Night Before the Test: . Be sure to Drink plenty of water. . Do not consume any caffeinated/decaffeinated beverages or chocolate 12 hours prior to your test. . Do not take any antihistamines 12 hours prior to your test.   On the Day of the Test: . Drink plenty of water. Do not drink any water within one hour of the test. . Do not eat any food 4 hours prior to the test. . You may take your regular medications prior to the test.    After the Test: . Drink plenty of water. . After receiving IV contrast, you may experience a mild flushed feeling. This is normal. . On occasion, you may experience a mild rash up to 24 hours after the test. This is not dangerous. If this occurs, you can take Benadryl 25 mg and increase your fluid intake. . If you experience trouble breathing, this can be serious. If it is severe call 911 IMMEDIATELY. If it is mild, please call our office. . If you take any of these medications: Glipizide/Metformin, Avandament, Glucavance, please do not take 48 hours after completing test unless otherwise instructed.   Once we have confirmed authorization from your insurance company, we will call you to set up a date and time for  your  test. Based on how quickly your insurance processes prior authorizations requests, please allow up to 4 weeks to be contacted for scheduling your Cardiac CT appointment. Be advised that routine Cardiac CT appointments could be scheduled as many as 8 weeks after your provider has ordered it.  For non-scheduling related questions, please contact the cardiac imaging nurse navigator should you have any questions/concerns: Marchia Bond, Cardiac Imaging Nurse Navigator Burley Saver, Interim Cardiac Imaging Nurse Navigator [facility] Direct Office Dial: [phone]   **For scheduling needs, including cancellations and rescheduling, please call Vivien Rota at [phone], option 3.     Follow-Up: At [facility], you and your health needs are our priority.  As part of our continuing mission to provide you with exceptional heart care, we have created designated Provider Care Teams.  These Care Teams include your primary Cardiologist (physician) and Advanced Practice Providers (APPs -  Physician Assistants and Nurse Practitioners) who all work together to provide you with the care you need, when you need it.  We recommend signing up for the patient portal called "MyChart".  Sign up information is provided on this After Visit Summary.  MyChart is used to connect with patients for Virtual Visits (Telemedicine).  Patients are able to view lab/test results, encounter notes, upcoming appointments, etc.  Non-urgent messages can be sent to your provider as well.   To learn more about what you can do with MyChart, go to [email or web address].    Your next appointment:   Follow up after Echo and CTA   The format for your next appointment:   In Person  Provider:   Kate Sable, MD   Other Instructions      Signed, Kate Sable, MD  12/06/2019 12:38 PM    [place]

## 2019-12-06 NOTE — Patient Instructions (Signed)
Medication Instructions:  Your physician recommends that you continue on your current medications as directed. Please refer to the Current Medication list given to you today.  *If you need a refill on your cardiac medications before your next appointment, please call your pharmacy*   Lab Work: None Ordered If you have labs (blood work) drawn today and your tests are completely normal, you will receive your results only by: Marland Kitchen MyChart Message (if you have MyChart) OR . A paper copy in the mail If you have any lab test that is abnormal or we need to change your treatment, we will call you to review the results.   Testing/Procedures:  1.  Your physician has requested that you have an echocardiogram. Echocardiography is a painless test that uses sound waves to create images of your heart. It provides your doctor with information about the size and shape of your heart and how well your heart's chambers and valves are working. This procedure takes approximately one hour. There are no restrictions for this procedure.  2.  Your physician has requested that you have cardiac CT. Cardiac computed tomography (CT) is a painless test that uses an x-ray machine to take clear, detailed pictures of your heart.     Your cardiac CT will be scheduled at one of the below locations:    [facility] [address] [place], [place] [address] [phone]   At [facility], please arrive 15 mins early for check-in and test prep.    Please follow these instructions carefully (unless otherwise directed):   Hold all erectile dysfunction medications at least 3 days (72 hrs) prior to test.   On the Night Before the Test: . Be sure to Drink plenty of water. . Do not consume any caffeinated/decaffeinated beverages or chocolate 12 hours prior to your test. . Do not take any antihistamines 12 hours prior to your test.   On the Day of the  Test: . Drink plenty of water. Do not drink any water within one hour of the test. . Do not eat any food 4 hours prior to the test. . You may take your regular medications prior to the test.    After the Test: . Drink plenty of water. . After receiving IV contrast, you may experience a mild flushed feeling. This is normal. . On occasion, you may experience a mild rash up to 24 hours after the test. This is not dangerous. If this occurs, you can take Benadryl 25 mg and increase your fluid intake. . If you experience trouble breathing, this can be serious. If it is severe call 911 IMMEDIATELY. If it is mild, please call our office. . If you take any of these medications: Glipizide/Metformin, Avandament, Glucavance, please do not take 48 hours after completing test unless otherwise instructed.   Once we have confirmed authorization from your insurance company, we will call you to set up a date and time for your test. Based on how quickly your insurance processes prior authorizations requests, please allow up to 4 weeks to be contacted for scheduling your Cardiac CT appointment. Be advised that routine Cardiac CT appointments could be scheduled as many as 8 weeks after your provider has ordered it.  For non-scheduling related questions, please contact the cardiac imaging nurse navigator should you have any questions/concerns: Marchia Bond, Cardiac Imaging Nurse Navigator Burley Saver, Interim Cardiac Imaging Nurse Navigator [facility] Direct Office Dial: [phone]   **  For scheduling needs, including cancellations and rescheduling, please call Vivien Rota at [phone], option 3.     Follow-Up: At [facility], you and your health needs are our priority.  As part of our continuing mission to provide you with exceptional heart care, we have created designated Provider Care Teams.  These Care Teams include your primary Cardiologist (physician) and Advanced Practice  Providers (APPs -  Physician Assistants and Nurse Practitioners) who all work together to provide you with the care you need, when you need it.  We recommend signing up for the patient portal called "MyChart".  Sign up information is provided on this After Visit Summary.  MyChart is used to connect with patients for Virtual Visits (Telemedicine).  Patients are able to view lab/test results, encounter notes, upcoming appointments, etc.  Non-urgent messages can be sent to your provider as well.   To learn more about what you can do with MyChart, go to [email or web address].    Your next appointment:   Follow up after Echo and CTA   The format for your next appointment:   In Person  Provider:   Kate Sable, MD   Other Instructions

## 2019-12-09 ENCOUNTER — Ambulatory Visit: Payer: Commercial Managed Care - PPO | Admitting: Nurse Practitioner

## 2019-12-09 ENCOUNTER — Other Ambulatory Visit: Payer: Self-pay

## 2019-12-09 ENCOUNTER — Encounter: Payer: Self-pay | Admitting: Nurse Practitioner

## 2019-12-09 VITALS — BP 150/80 | HR 51 | Temp 98.6°F | Ht 66.0 in | Wt 191.0 lb

## 2019-12-09 DIAGNOSIS — Z23 Encounter for immunization: Secondary | ICD-10-CM | POA: Diagnosis not present

## 2019-12-09 DIAGNOSIS — I10 Essential (primary) hypertension: Secondary | ICD-10-CM | POA: Diagnosis not present

## 2019-12-09 MED ORDER — AMLODIPINE BESYLATE 2.5 MG PO TABS: 2.5000 mg | ORAL_TABLET | Freq: Every day | ORAL | 0 refills | Status: DC

## 2019-12-09 NOTE — Patient Instructions (Addendum)
Please start amlodipine 2. 5 mg for hypertension.   A low salt diet, weight loss and when cleared by cardiology regular exercise will also help.   Please check your blood pressure every day for 3 days and write it down.  Call the office with the report.  We can see how you are tolerating the medication.   Keep your cardiology appointment for testing as planned.  Please follow-up with me in the office in 1 month.     Managing Your Hypertension Hypertension is commonly called high blood pressure. This is when the force of your blood pressing against the walls of your arteries is too strong. Arteries are blood vessels that carry blood from your heart throughout your body. Hypertension forces the heart to work harder to pump blood, and may cause the arteries to become narrow or stiff. Having untreated or uncontrolled hypertension can cause heart attack, stroke, kidney disease, and other problems. What are blood pressure readings? A blood pressure reading consists of a higher number over a lower number. Ideally, your blood pressure should be below 120/80. The first ("top") number is called the systolic pressure. It is a measure of the pressure in your arteries as your heart beats. The second ("bottom") number is called the diastolic pressure. It is a measure of the pressure in your arteries as the heart relaxes. What does my blood pressure reading mean? Blood pressure is classified into four stages. Based on your blood pressure reading, your health care provider may use the following stages to determine what type of treatment you need, if any. Systolic pressure and diastolic pressure are measured in a unit called mm Hg. Normal  Systolic pressure: below 120.  Diastolic pressure: below 80. Elevated  Systolic pressure: 120-129.  Diastolic pressure: below 80. Hypertension stage 1  Systolic pressure: 130-139.  Diastolic pressure: 80-89. Hypertension stage 2  Systolic pressure: 140 or  above.  Diastolic pressure: 90 or above. What health risks are associated with hypertension? Managing your hypertension is an important responsibility. Uncontrolled hypertension can lead to:  A heart attack.  A stroke.  A weakened blood vessel (aneurysm).  Heart failure.  Kidney damage.  Eye damage.  Metabolic syndrome.  Memory and concentration problems. What changes can I make to manage my hypertension? Hypertension can be managed by making lifestyle changes and possibly by taking medicines. Your health care provider will help you make a plan to bring your blood pressure within a normal range. Eating and drinking   Eat a diet that is high in fiber and potassium, and low in salt (sodium), added sugar, and fat. An example eating plan is called the DASH (Dietary Approaches to Stop Hypertension) diet. To eat this way: ? Eat plenty of fresh fruits and vegetables. Try to fill half of your plate at each meal with fruits and vegetables. ? Eat whole grains, such as whole wheat pasta, brown rice, or whole grain bread. Fill about one quarter of your plate with whole grains. ? Eat low-fat diary products. ? Avoid fatty cuts of meat, processed or cured meats, and poultry with skin. Fill about one quarter of your plate with lean proteins such as fish, chicken without skin, beans, eggs, and tofu. ? Avoid premade and processed foods. These tend to be higher in sodium, added sugar, and fat.  Reduce your daily sodium intake. Most people with hypertension should eat less than 1,500 mg of sodium a day.  Limit alcohol intake to no more than 1 drink a day for  nonpregnant women and 2 drinks a day for men. One drink equals 12 oz of beer, 5 oz of wine, or 1 oz of hard liquor. Lifestyle  Work with your health care provider to maintain a healthy body weight, or to lose weight. Ask what an ideal weight is for you.  Get at least 30 minutes of exercise that causes your heart to beat faster (aerobic  exercise) most days of the week. Activities may include walking, swimming, or biking.  Include exercise to strengthen your muscles (resistance exercise), such as weight lifting, as part of your weekly exercise routine. Try to do these types of exercises for 30 minutes at least 3 days a week.  Do not use any products that contain nicotine or tobacco, such as cigarettes and e-cigarettes. If you need help quitting, ask your health care provider.  Control any long-term (chronic) conditions you have, such as high cholesterol or diabetes. Monitoring  Monitor your blood pressure at home as told by your health care provider. Your personal target blood pressure may vary depending on your medical conditions, your age, and other factors.  Have your blood pressure checked regularly, as often as told by your health care provider. Working with your health care provider  Review all the medicines you take with your health care provider because there may be side effects or interactions.  Talk with your health care provider about your diet, exercise habits, and other lifestyle factors that may be contributing to hypertension.  Visit your health care provider regularly. Your health care provider can help you create and adjust your plan for managing hypertension. Will I need medicine to control my blood pressure? Your health care provider may prescribe medicine if lifestyle changes are not enough to get your blood pressure under control, and if:  Your systolic blood pressure is 130 or higher.  Your diastolic blood pressure is 80 or higher. Take medicines only as told by your health care provider. Follow the directions carefully. Blood pressure medicines must be taken as prescribed. The medicine does not work as well when you skip doses. Skipping doses also puts you at risk for problems. Contact a health care provider if:  You think you are having a reaction to medicines you have taken.  You have repeated  (recurrent) headaches.  You feel dizzy.  You have swelling in your ankles.  You have trouble with your vision. Get help right away if:  You develop a severe headache or confusion.  You have unusual weakness or numbness, or you feel faint.  You have severe pain in your chest or abdomen.  You vomit repeatedly.  You have trouble breathing. Summary  Hypertension is when the force of blood pumping through your arteries is too strong. If this condition is not controlled, it may put you at risk for serious complications.  Your personal target blood pressure may vary depending on your medical conditions, your age, and other factors. For most people, a normal blood pressure is less than 120/80.  Hypertension is managed by lifestyle changes, medicines, or both. Lifestyle changes include weight loss, eating a healthy, low-sodium diet, exercising more, and limiting alcohol. This information is not intended to replace advice given to you by your health care provider. Make sure you discuss any questions you have with your health care provider. Document Revised: 07/03/2018 Document Reviewed: 02/07/2016 Elsevier Patient Education  2020 [employer].

## 2019-12-13 ENCOUNTER — Encounter: Payer: Self-pay | Admitting: Nurse Practitioner

## 2019-12-13 NOTE — Progress Notes (Signed)
Established Patient Office Visit  Subjective:  Patient ID: Craig Finley, male    DOB: [birth year]  Age: 59 y.o. MRN: [id number]  CC:  Chief Complaint  Patient presents with  . Follow-up    hypertension    HPI Craig Finley presents for HTN follow up.  Craig Finley established care on 11/04/2019 and reported history of anal fissure, mild constipation, GERD, and was seen in the ER for chest pain.  Chest pain/Angina: Craig Finley saw cardiology and has testing planned.  Craig Finley has no active chest pain,  shortness of breath DOE.  His blood pressure was elevated.  Craig Finley does not have a blood pressure cuff and has not been checking it at home.  BP Readings from Last 3 Encounters:  12/09/19 (!) 150/80  12/06/19 140/90  11/04/19 130/90   Pulse Readings from Last 3 Encounters:  12/09/19 (!) 51  12/06/19 (!) 48  11/04/19 69   BMI: 30/obesity: Not exercising until cardiac testing for angina completed. Craig Finley is watching his diet, less alcohol use and watching his salt.   History of anal fissure, bright red blood per rectum, GERD: Craig Finley has been referred to GI.  No current complaints.  Tobacco use disorder: Chewing tobacco. Dipping x 25-30 years. Advised to cut back. Discussed Chantix briefly-not interested, but will pursue in follow up.  Would not add nicotine  patches or gum with active angina.    Past Medical History:  Diagnosis Date  . Allergy   . Blood in stool   . Heart murmur    as child    Past Surgical History:  Procedure Laterality Date  . ANAL FISSURE REPAIR  05/1998   some type of anal fissure procedure    Family History  Problem Relation Age of Onset  . Heart disease Maternal Uncle   . Heart disease Maternal Uncle   . Arthritis Mother   . Asthma Mother   . Hypertension Mother   . Hearing loss Father   . Alcohol abuse Sister   . Cancer Sister   . Depression Sister   . Drug abuse Sister   . Early death Sister   . Heart attack Sister   . Asthma Daughter   .  Asthma Son   . Alcohol abuse Maternal Grandmother   . Cancer Maternal Grandmother   . Cancer Maternal Grandfather   . Hypertension Sister   . Miscarriages / Stillbirths Sister   . Asthma Sister   . Depression Sister     Social History   Socioeconomic History  . Marital status: Married    Spouse name: Not on file  . Number of children: Not on file  . Years of education: Not on file  . Highest education level: Some college, no degree  Occupational History  . Occupation: trucker  Tobacco Use  . Smoking status: Never Smoker  . Smokeless tobacco: Current User    Types: Snuff  . Tobacco comment: dipping x 25-30 years  Vaping Use  . Vaping Use: Never used  Substance and Sexual Activity  . Alcohol use: Yes    Comment: drinks 4-7 beers some days and none others. Craig Finley drinks 24 12 oz can  beers per   . Drug use: Never  . Sexual activity: Yes  Other Topics Concern  . 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 Difficulty of Paying  Living Expenses: Not on file  Food Insecurity:   . Worried About Programme researcher, broadcasting/film/video in the Last Year: Not on file  . Ran Out of Food in the Last Year: Not on file  Transportation Needs:   . Lack of Transportation (Medical): Not on file  . Lack of Transportation (Non-Medical): Not on file  Physical Activity:   . Days of Exercise per Week: Not on file  . Minutes of Exercise per Session: Not on file  Stress:   . Feeling of Stress : Not on file  Social Connections:   . Frequency of Communication with Friends and Family: Not on file  . Frequency of Social Gatherings with Friends and Family: Not on file  . Attends Religious Services: Not on file  . Active Member of Clubs or Organizations: Not on file  . Attends Banker Meetings: Not on file  . Marital Status: Not on file  Intimate Partner Violence:   . Fear of Current or  Ex-Partner: Not on file  . Emotionally Abused: Not on file  . Physically Abused: Not on file  . Sexually Abused: Not on file    Outpatient Medications Prior to Visit  Medication Sig Dispense Refill  . Acetaminophen (TYLENOL PO) Take by mouth as needed.    Marland Kitchen aspirin EC 81 MG tablet Take 81 mg by mouth daily. Swallow whole.    Marland Kitchen Fexofenadine-Pseudoephedrine (ALLEGRA-D PO) Take 1 tablet by mouth daily as needed (allergies).    Marland Kitchen omeprazole (PRILOSEC) 20 MG capsule Take 1 capsule (20 mg total) by mouth daily. 30 capsule 3   No facility-administered medications prior to visit.    No Known Allergies  Review of Systems  Constitutional: Negative for chills and fever.  HENT: Negative.   Eyes: Negative.   Respiratory: Negative.   Cardiovascular: Negative for chest pain and leg swelling.  Endocrine: Negative.   Genitourinary: Negative.   Musculoskeletal: Negative.   Neurological: Negative.   Psychiatric/Behavioral: Negative.       Objective:    Physical Exam Vitals reviewed.  Cardiovascular:     Rate and Rhythm: Bradycardia present.     Pulses: Normal pulses.     Heart sounds: Normal heart sounds.  Pulmonary:     Effort: Pulmonary effort is normal.     Breath sounds: Normal breath sounds.  Abdominal:     Palpations: Abdomen is soft.     Tenderness: There is no abdominal tenderness.  Musculoskeletal:     Cervical back: Normal range of motion.     Right lower leg: No edema.     Left lower leg: No edema.  Skin:    General: Skin is warm and dry.  Neurological:     General: No focal deficit present.     Mental Status: Craig Finley is alert and oriented to person, place, and time.  Psychiatric:        Mood and Affect: Mood normal.        Behavior: Behavior normal.     BP (!) 150/80 (BP Location: Left Arm, Patient Position: Sitting, Cuff Size: Normal)   Pulse (!) 51   Temp 98.6 F (37 C) (Oral)   Ht 5\' 6"  (1.676 m)   Wt 191 lb (86.6 kg)   SpO2 98%   BMI 30.83 kg/m  Wt  Readings from Last 3 Encounters:  12/09/19 191 lb (86.6 kg)  12/06/19 194 lb (88 kg)  11/25/19 189 lb (85.7 kg)   Pulse Readings from Last 3 Encounters:  12/09/19 (!) 51  12/06/19 (!) 48  11/04/19 69    BP Readings from Last 3 Encounters:  12/09/19 (!) 150/80  12/06/19 140/90  11/04/19 130/90    Lab Results  Component Value Date   CHOL 157 11/18/2019   HDL 42.40 11/18/2019   LDLCALC 88 11/18/2019   TRIG 134.0 11/18/2019   CHOLHDL 4 11/18/2019      Health Maintenance Due  Topic Date Due  . TETANUS/TDAP  Never done  . COLONOSCOPY  Never done    There are no preventive care reminders to display for this patient.  Lab Results  Component Value Date   TSH 2.13 11/18/2019   Lab Results  Component Value Date   WBC 5.2 11/18/2019   HGB 15.6 11/18/2019   HCT 45.4 11/18/2019   MCV 87.9 11/18/2019   PLT 207.0 11/18/2019   Lab Results  Component Value Date   NA 141 11/18/2019   K 3.6 11/18/2019   CO2 31 11/18/2019   GLUCOSE 86 11/18/2019   BUN 19 11/18/2019   CREATININE 1.51 (H) 11/18/2019   BILITOT 0.9 11/18/2019   ALKPHOS 37 (L) 11/18/2019   AST 30 11/18/2019   ALT 29 11/18/2019   PROT 6.8 11/18/2019   ALBUMIN 4.1 11/18/2019   CALCIUM 9.1 11/18/2019   ANIONGAP 9 11/03/2019   GFR 57.56 (L) 11/18/2019   Lab Results  Component Value Date   CHOL 157 11/18/2019   Lab Results  Component Value Date   HDL 42.40 11/18/2019   Lab Results  Component Value Date   LDLCALC 88 11/18/2019   Lab Results  Component Value Date   TRIG 134.0 11/18/2019   Lab Results  Component Value Date   CHOLHDL 4 11/18/2019   Lab Results  Component Value Date   HGBA1C 5.5 11/18/2019      Assessment & Plan:   Problem List Items Addressed This Visit      Cardiovascular and Mediastinum   Essential hypertension - Primary   Relevant Medications   amLODipine (NORVASC) 2.5 MG tablet    Other Visit Diagnoses    Need for immunization against influenza       Relevant  Orders   Flu Vaccine QUAD 36+ mos IM (Completed)      Meds ordered this encounter  Medications  . amLODipine (NORVASC) 2.5 MG tablet    Sig: Take 1 tablet (2.5 mg total) by mouth daily.    Dispense:  90 tablet    Refill:  0    Order Specific Question:   Supervising Provider    Answer:   Dale DurhamSCOTT, CHARLENE [[id number]]   Cr is elevated and suspect form long standing NSAID use and CKD. Craig Finley has stopped it for the most part. We discussed need to stop all together and use Tylenol  if Craig Finley gets a HA. Craig Finley also does not drink enough and advised to hydrate well daily. Added labs for next month- micro albumin, Bmet, UA and consider referral to Nephrology as needed.   BP is persistently mildly elevated. reports less alcohol and salt, diet is as improved as Craig Finley can do for now. Please start amlodipine 2. 5 mg for hypertension.   A low salt diet, weight loss and when cleared by cardiology regular exercise will also help.   Please check your blood pressure every day for 3 days and write it down.  Call the office with the report.  We can see how you are tolerating the medication.   Keep your cardiology appointment for  testing as planned.  Please follow-up with me in the office in 1 month.  Follow-up: Return in about 4 weeks (around 01/06/2020).   This visit occurred during the SARS-CoV-2 public health emergency.  Safety protocols were in place, including screening questions prior to the visit, additional usage of staff PPE, and extensive cleaning of exam room while observing appropriate contact time as indicated for disinfecting solutions.   Amedeo Kinsman, NP

## 2019-12-21 ENCOUNTER — Telehealth (HOSPITAL_COMMUNITY): Payer: Self-pay | Admitting: *Deleted

## 2019-12-21 NOTE — Telephone Encounter (Signed)

## 2019-12-23 ENCOUNTER — Ambulatory Visit
Admission: RE | Admit: 2019-12-23 | Discharge: 2019-12-23 | Disposition: A | Discharge status: Home / Self Care | Payer: Commercial Managed Care - PPO | Source: Ambulatory Visit | Attending: Cardiology | Admitting: Cardiology

## 2019-12-23 ENCOUNTER — Other Ambulatory Visit: Payer: Self-pay

## 2019-12-23 DIAGNOSIS — R072 Precordial pain: Secondary | ICD-10-CM

## 2019-12-23 HISTORY — DX: Essential (primary) hypertension: I10

## 2019-12-23 MED ORDER — IOHEXOL 350 MG/ML SOLN
85.0000 mL | Freq: Once | INTRAVENOUS | Status: AC | PRN
  Administered 2019-12-23: 85 mL via INTRAVENOUS

## 2019-12-23 MED ORDER — NITROGLYCERIN 0.4 MG SL SUBL
0.8000 mg | SUBLINGUAL_TABLET | Freq: Once | SUBLINGUAL | Status: AC
  Administered 2019-12-23: 0.8 mg via SUBLINGUAL

## 2019-12-23 NOTE — Progress Notes (Signed)
Patient tolerated procedure well. Ambulate w/o difficulty. Sitting in chair drinking water provided. Encouraged to drink extra water today and reasoning. Verbalized understanding. All questions answered. ABC intact. No further needs. Discharge from procedure area w/o issues.  

## 2019-12-28 ENCOUNTER — Telehealth: Payer: Self-pay

## 2019-12-28 NOTE — Telephone Encounter (Signed)
Called X1 unable to leave a message on VM.

## 2020-01-05 ENCOUNTER — Ambulatory Visit (INDEPENDENT_AMBULATORY_CARE_PROVIDER_SITE_OTHER): Payer: Commercial Managed Care - PPO

## 2020-01-05 ENCOUNTER — Other Ambulatory Visit: Payer: Self-pay

## 2020-01-05 DIAGNOSIS — I209 Angina pectoris, unspecified: Secondary | ICD-10-CM

## 2020-01-05 LAB — ECHOCARDIOGRAM COMPLETE
AR max vel: 2.91 cm2
AV Area VTI: 3.51 cm2
AV Area mean vel: 3.15 cm2
AV Mean grad: 4 mmHg
AV Peak grad: 7.5 mmHg
Ao pk vel: 1.37 m/s
Area-P 1/2: 3.3 cm2
Calc EF: 46.8 %
S' Lateral: 2.8 cm
Single Plane A2C EF: 46.7 %
Single Plane A4C EF: 49 %

## 2020-01-06 ENCOUNTER — Telehealth: Payer: Self-pay

## 2020-01-06 ENCOUNTER — Encounter: Payer: Self-pay | Admitting: Nurse Practitioner

## 2020-01-06 ENCOUNTER — Telehealth (INDEPENDENT_AMBULATORY_CARE_PROVIDER_SITE_OTHER): Payer: Commercial Managed Care - PPO | Admitting: Nurse Practitioner

## 2020-01-06 VITALS — BP 132/81 | Ht 66.0 in | Wt 195.0 lb

## 2020-01-06 DIAGNOSIS — R7989 Other specified abnormal findings of blood chemistry: Secondary | ICD-10-CM | POA: Diagnosis not present

## 2020-01-06 DIAGNOSIS — I10 Essential (primary) hypertension: Secondary | ICD-10-CM | POA: Diagnosis not present

## 2020-01-06 DIAGNOSIS — R918 Other nonspecific abnormal finding of lung field: Secondary | ICD-10-CM | POA: Diagnosis not present

## 2020-01-06 DIAGNOSIS — R079 Chest pain, unspecified: Secondary | ICD-10-CM | POA: Diagnosis not present

## 2020-01-06 NOTE — Telephone Encounter (Signed)
Call attempted to discuss CT results with patient. Unable to leave message.

## 2020-01-06 NOTE — Patient Instructions (Addendum)
Continue with amlodipine 2.5 mg daily.  Continue to monitor blood pressure at home.  Smoking cessation information provided with [place] quit line, 1 [phone] and call for more information,   Consider chewing nicotine gum or lozenges.  You would use the lowest dose possible 2 mg dose.    Weeks 1-6: use 1 piece every 1-2 hours.   Weeks 7-8 use 1 piece every 2-4 hours  Weeks 10-12 1 piece every 4-8 hours Duration of therapy is 12 weeks.  Take FLonase/Allegra daily for nasal congestion.  Pulmonary nodules and bilateral atelectatiss on coronary chest CT: referral to Pulmonary to follow.   Elevated creatinine in August, overdue for repeat B met.  Please obtain a lab appointment .   If still having left breast tenderness- make appointment sooner for recheck.   Follow-up office visit in 3 months with your own blood pressure cuff so we can check readings in the office.  Bring in your written blood pressure reading record from home.

## 2020-01-06 NOTE — Telephone Encounter (Signed)
Left voicemail message advising patient to call back to discuss echo results. 

## 2020-01-06 NOTE — Progress Notes (Signed)
Virtual Visit via Video Note  This visit type was conducted due to national recommendations for restrictions regarding the COVID-19 pandemic (e.g. social distancing).  This format is felt to be most appropriate for this patient at this time.  All issues noted in this document were discussed and addressed.  No physical exam was performed (except for noted visual exam findings with Video Visits).   I connected with@ on 01/09/20 at 10:30 AM EDT by a video enabled telemedicine application or telephone and verified that I am speaking with the correct person using two identifiers. 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 I also discussed with the patient that there may be a patient responsible charge related to this service. The patient expressed understanding and agreed to proceed.  Reason for visit: Follow-up blood pressure on new start amlodipine. Follow-up after cardiology testing for chest pain.  HPI: This 59 year old male established care in August after ER visit for exertional chest pain. He returns for f/up of new start amlodipine 2.5 mg for HTN.  He states he feels really good. No concerns today.  Chest pain: He has been seen by cardiology and underwent a recent CTA study on 12/23/2019 which showed normal  coronary arteries-no evidence for CAD. There were incidental findings of a 2.4 cm liver cyst, and pulmonary nodules in left lung, largest measuring 4 mm. Also had dependent densities or atelectasis in the lower lungs. He also had an echocardiogram performed that showed normal systolic and diastolic function, overall normal with no findings to suggest etiology of chest pain.  HTN: He is on amlodipine 2.5 mg daily from 12/09/2019 video  visit set based on home blood pressures elevated. In office for cardiac testing blood pressure was low at 103/58. He reports today his blood pressure is  135/77 and he has been checking at home it usually around 132/81. He feels well without any more exertional chest pain. No edema, dizziness or lightheadedness.  BP Readings from Last 3 Encounters:  01/07/20 129/83  01/06/20 132/81  12/23/19 (!) 103/58   Elevated creatinine: avoiding NSAIDs now with elevated creatinine in Aug- due for repeat Bmet. No diuretics.     Chemistry      Component Value Date/Time   NA 141 11/18/2019 0809   K 3.6 11/18/2019 0809   CL 103 11/18/2019 0809   CO2 31 11/18/2019 0809   BUN 19 11/18/2019 0809   CREATININE 1.51 (H) 11/18/2019 0809      Component Value Date/Time   CALCIUM 9.1 11/18/2019 0809   ALKPHOS 37 (L) 11/18/2019 0809   AST 30 11/18/2019 0809   ALT 29 11/18/2019 0809   BILITOT 0.9 11/18/2019 0809       GERD/bright red blood per rectum/constipation: Patient has a gastroenterology appointment Dr. Marius Ditch on 01/07/20. He takes daily Prilosec.  Nicotine dependence: Chewing tobacco, would be interested in cutting back in the future.  Elevated creatinine: History of NSAID use now discontinued will monitor  ROS: He reports slight congestion in May and every so often he will have it again. More so in the evening and he thinks is postnasal drainage. He takes his allergy medication Flonase and Allegra occasionally. Currently, he has no nasal congestion, sore throat, cough, congestion symptoms at this time. No shortness of breath or DOE. He had  mentioned left breast tenderness in September-not discussed today.   Past Medical History:  Diagnosis Date   Allergy    Blood in stool    Heart murmur    as child   Hypertension     Past Surgical History:  Procedure Laterality Date   ANAL FISSURE REPAIR  05/1998   some type of anal fissure procedure    Family History  Problem Relation Age of  Onset   Heart disease Maternal Uncle    Heart disease Maternal Uncle    Arthritis Mother    Asthma Mother    Hypertension Mother    Hearing loss Father    Alcohol abuse Sister    Cancer Sister    Depression Sister    Drug abuse Sister    Early death Sister    Heart attack Sister    Asthma Daughter    Asthma Son    Alcohol abuse Maternal Grandmother    Cancer Maternal Grandmother    Cancer Maternal Grandfather    Hypertension Sister    33 / Stillbirths Sister    Asthma Sister    Depression Sister    SOCIAL HX: Chewing tobacco  Current Outpatient Medications:    Acetaminophen (TYLENOL PO), Take by mouth as needed., Disp: , Rfl:    amLODipine (NORVASC) 2.5 MG tablet, Take 1 tablet (2.5 mg total) by mouth daily., Disp: 90 tablet, Rfl: 0   aspirin EC 81 MG tablet, Take 81 mg by mouth daily. Swallow whole. (Patient not taking: Reported on 01/07/2020), Disp: , Rfl:    Fexofenadine-Pseudoephedrine (ALLEGRA-D PO), Take 1 tablet by mouth daily as needed (allergies)., Disp: , Rfl:    omeprazole (PRILOSEC) 20 MG capsule, Take 1 capsule (20 mg total) by mouth daily., Disp: 30 capsule, Rfl: 3  EXAM:  VITALS per patient if applicable: Weight 782 blood pressure 135/77 this morning  GENERAL: alert, oriented, appears well and in no acute distress  HEENT: atraumatic, conjunctiva clear, no obvious abnormalities on inspection of external nose and ears  NECK: normal movements of the head and neck  LUNGS: on inspection no signs of respiratory distress, breathing rate appears normal, no obvious gross SOB, gasping or wheezing  CV: no obvious cyanosis  MS: moves all visible extremities without noticeable abnormality  PSYCH/NEURO: pleasant and cooperative, no obvious depression or anxiety, speech and thought processing grossly intact  ASSESSMENT AND PLAN:  Discussed the following assessment and plan:  Essential hypertension  Chest pain, unspecified  type  Pulmonary nodules  Elevated serum creatinine - Plan: Basic metabolic panel  No problem-specific Assessment & Plan notes found for this encounter.  Continue with amlodipine 2.5 mg daily.  Continue to monitor blood pressure at home.  Smoking cessation information provided with [place] quit line, 1 [phone] and call for more information,   Consider chewing nicotine gum or lozenges.  You would use the lowest dose possible 2 mg dose.    Weeks 1-6: use 1 piece every 1-2 hours.   Weeks 7-8 use 1 piece every 2-4 hours  Weeks 10-12 1 piece every 4-8 hours Duration of therapy is 12 weeks.  Take Flonase/Allegra daily for nasal congestion.  Pulmonary nodules and bilateral atelectasis on coronary chest CT: referral to Pulmonary to follow.   Elevated creatinine in August, overdue for repeat B met.  Please obtain a lab appointment .   If still having left breast tenderness- make appointment sooner for recheck.   Follow-up office visit in 3 months with your own blood pressure  cuff so we can check readings in the office.  Bring in your written blood pressure reading record from home. I discussed the assessment and treatment plan with the patient. The patient was provided an opportunity to ask questions and all were answered. The patient agreed with the plan and demonstrated an understanding of the instructions.   The patient was advised to call back or seek an in-person evaluation if the symptoms worsen or if the condition fails to improve as anticipated.  Denice Paradise, NP Adult Nurse Practitioner [place] [phone]

## 2020-01-07 ENCOUNTER — Other Ambulatory Visit: Payer: Self-pay

## 2020-01-07 ENCOUNTER — Ambulatory Visit (INDEPENDENT_AMBULATORY_CARE_PROVIDER_SITE_OTHER): Payer: Self-pay | Admitting: Gastroenterology

## 2020-01-07 ENCOUNTER — Encounter: Payer: Self-pay | Admitting: Gastroenterology

## 2020-01-07 VITALS — BP 129/83 | HR 71 | Temp 98.2°F | Ht 66.0 in | Wt 193.2 lb

## 2020-01-07 DIAGNOSIS — R0789 Other chest pain: Secondary | ICD-10-CM | POA: Insufficient documentation

## 2020-01-07 DIAGNOSIS — K219 Gastro-esophageal reflux disease without esophagitis: Secondary | ICD-10-CM

## 2020-01-07 DIAGNOSIS — R918 Other nonspecific abnormal finding of lung field: Secondary | ICD-10-CM | POA: Insufficient documentation

## 2020-01-07 DIAGNOSIS — K625 Hemorrhage of anus and rectum: Secondary | ICD-10-CM

## 2020-01-07 DIAGNOSIS — K5909 Other constipation: Secondary | ICD-10-CM

## 2020-01-07 MED ORDER — CLENPIQ 10-3.5-12 MG-GM -GM/160ML PO SOLN: 320.0000 mL | Freq: Once | ORAL | 0 refills | Status: AC

## 2020-01-07 NOTE — Progress Notes (Signed)
Arlyss Repress, MD [address]  [address]  [place], [place] [address]  Main: [phone]  Fax: [phone]    Gastroenterology Consultation  Referring Provider:     Theadore Nan, NP Primary Care Physician:  Theadore Nan, NP Primary Gastroenterologist:  Dr. Arlyss Repress Reason for Consultation:     Heartburn, rectal bleeding, constipation        HPI:   Craig Finley is a 59 y.o. male referred by Dr. Arvilla Market, Ranae Plumber, NP  for consultation & management of heartburn, rectal bleeding and constipation  Heartburn: Patient reports episodes of intermittent heartburn for his several years, particularly after eating spicy foods.  He enjoys eating hot sauce and that triggers intense heartburn.  He was taking Nexium and Prilosec in the past.  He had recent flareup and started on omeprazole 20 mg daily which provides relief.  He denies any other upper GI symptoms  Constipation and rectal bleeding: Patient reports history of constipation which he is working to improve by adding more fiber in his diet, drinking lots of water.  He did notice intermittent rectal bleeding, not recently.  He did have history of anal fissure when he was in active military, treated with medication, later recurred, underwent surgical repair.  He denies any rectal pain, burning.  He does have intermittent itching and discomfort.  He does not smoke or drink alcohol He denies family history of GI malignancy Patient is an Psychiatric nurse as well as Psychologist, occupational for [employer]  NSAIDs: None  Antiplts/Anticoagulants/Anti thrombotics: None  GI Procedures: None  Past Medical History:  Diagnosis Date  . Allergy   . Blood in stool   . Heart murmur    as child  . Hypertension     Past Surgical History:  Procedure Laterality Date  . ANAL FISSURE REPAIR  05/1998   some type of anal fissure procedure    Current Outpatient Medications:  .  Acetaminophen (TYLENOL PO), Take by  mouth as needed., Disp: , Rfl:  .  amLODipine (NORVASC) 2.5 MG tablet, Take 1 tablet (2.5 mg total) by mouth daily., Disp: 90 tablet, Rfl: 0 .  Fexofenadine-Pseudoephedrine (ALLEGRA-D PO), Take 1 tablet by mouth daily as needed (allergies)., Disp: , Rfl:  .  omeprazole (PRILOSEC) 20 MG capsule, Take 1 capsule (20 mg total) by mouth daily., Disp: 30 capsule, Rfl: 3 .  aspirin EC 81 MG tablet, Take 81 mg by mouth daily. Swallow whole. (Patient not taking: Reported on 01/07/2020), Disp: , Rfl:  .  Sod Picosulfate-Mag Ox-Cit Acd (CLENPIQ) 10-3.5-12 MG-GM -GM/160ML SOLN, Take 320 mLs by mouth once for 1 dose., Disp: 320 mL, Rfl: 0   Family History  Problem Relation Age of Onset  . Heart disease Maternal Uncle   . Heart disease Maternal Uncle   . Arthritis Mother   . Asthma Mother   . Hypertension Mother   . Hearing loss Father   . Alcohol abuse Sister   . Cancer Sister   . Depression Sister   . Drug abuse Sister   . Early death Sister   . Heart attack Sister   . Asthma Daughter   . Asthma Son   . Alcohol abuse Maternal Grandmother   . Cancer Maternal Grandmother   . Cancer Maternal Grandfather   . Hypertension Sister   . Miscarriages / Stillbirths Sister   . Asthma Sister   . Depression Sister      Social History   Tobacco Use  .  Smoking status: Never Smoker  . Smokeless tobacco: Current User    Types: Snuff  . Tobacco comment: dipping x 25-30 years  Vaping Use  . Vaping Use: Never used  Substance Use Topics  . Alcohol use: Yes    Comment: drinks 4-7 beers some days and none others. He drinks 24 12 oz can  beers per   . Drug use: Never    Allergies as of 01/07/2020  . (No Known Allergies)    Review of Systems:    All systems reviewed and negative except where noted in HPI.   Physical Exam:  BP 129/83 (BP Location: Left Arm, Patient Position: Sitting, Cuff Size: Normal)   Pulse 71   Temp 98.2 F (36.8 C) (Oral)   Ht 5\' 6"  (1.676 m)   Wt 193 lb 4 oz (87.7 kg)    BMI 31.19 kg/m  No LMP for male patient.  General:   Alert,  Well-developed, well-nourished, pleasant and cooperative in NAD Head:  Normocephalic and atraumatic. Eyes:  Sclera clear, no icterus.   Conjunctiva pink. Ears:  Normal auditory acuity. Nose:  No deformity, discharge, or lesions. Mouth:  No deformity or lesions,oropharynx pink & moist. Neck:  Supple; no masses or thyromegaly. Lungs:  Respirations even and unlabored.  Clear throughout to auscultation.   No wheezes, crackles, or rhonchi. No acute distress. Heart:  Regular rate and rhythm; no murmurs, clicks, rubs, or gallops. Abdomen:  Normal bowel sounds. Soft, non-tender and non-distended without masses, hepatosplenomegaly or hernias noted.  No guarding or rebound tenderness.   Rectal: Not performed Msk:  Symmetrical without gross deformities. Good, equal movement & strength bilaterally. Pulses:  Normal pulses noted. Extremities:  No clubbing or edema.  No cyanosis. Neurologic:  Alert and oriented x3;  grossly normal neurologically. Skin:  Intact without significant lesions or rashes. No jaundice. Psych:  Alert and cooperative. Normal mood and affect.  Imaging Studies: Reviewed  Assessment and Plan:   Craig Finley is a 59 y.o. African-American male with history of hypertension, chronic intermittent heartburn, chronic intermittent constipation, history of anal fissure s/p repair and painless rectal bleeding  Chronic heartburn Recommend EGD for further evaluation Discussed about antireflux lifestyle, advised to avoid food triggers Continue omeprazole 20 mg once daily before meals  Chronic intermittent constipation Discussed about high-fiber diet Trial of MiraLAX as needed  Rectal bleeding Recommend colonoscopy for further evaluation, if unremarkable and bleeding is persistent, will discuss about hemorrhoid ligation  Follow up after above work-up   41, MD

## 2020-01-10 ENCOUNTER — Other Ambulatory Visit: Payer: Self-pay

## 2020-01-10 ENCOUNTER — Encounter: Payer: Self-pay | Admitting: Cardiology

## 2020-01-10 ENCOUNTER — Ambulatory Visit: Payer: Commercial Managed Care - PPO | Admitting: Cardiology

## 2020-01-10 VITALS — BP 130/88 | HR 60 | Ht 66.75 in | Wt 195.0 lb

## 2020-01-10 DIAGNOSIS — I10 Essential (primary) hypertension: Secondary | ICD-10-CM

## 2020-01-10 DIAGNOSIS — R079 Chest pain, unspecified: Secondary | ICD-10-CM

## 2020-01-10 DIAGNOSIS — Z720 Tobacco use: Secondary | ICD-10-CM | POA: Diagnosis not present

## 2020-01-10 MED ORDER — CLENPIQ 10-3.5-12 MG-GM -GM/160ML PO SOLN: 320.0000 mL | Freq: Once | ORAL | 0 refills | Status: AC

## 2020-01-10 MED ORDER — AMLODIPINE BESYLATE 5 MG PO TABS: 5.0000 mg | ORAL_TABLET | Freq: Every day | ORAL | 3 refills | Status: DC

## 2020-01-10 NOTE — Patient Instructions (Signed)
Medication Instructions:  Your physician has recommended you make the following change in your medication:  1- INCREASE Amlodipine to 5 mg by mouth once a day.  *If you need a refill on your cardiac medications before your next appointment, please call your pharmacy*  Follow-Up: At [facility], you and your health needs are our priority.  As part of our continuing mission to provide you with exceptional heart care, we have created designated Provider Care Teams.  These Care Teams include your primary Cardiologist (physician) and Advanced Practice Providers (APPs -  Physician Assistants and Nurse Practitioners) who all work together to provide you with the care you need, when you need it.  We recommend signing up for the patient portal called "MyChart".  Sign up information is provided on this After Visit Summary.  MyChart is used to connect with patients for Virtual Visits (Telemedicine).  Patients are able to view lab/test results, encounter notes, upcoming appointments, etc.  Non-urgent messages can be sent to your provider as well.   To learn more about what you can do with MyChart, go to [email or web address].    Your next appointment:   12 month(s)  The format for your next appointment:   In Person  Provider:   You may see Debbe Odea, MD or one of the following Advanced Practice Providers on your designated Care Team:    Nicolasa Ducking, NP  Eula Listen, PA-C  Marisue Ivan, PA-C  [place], [place]

## 2020-01-10 NOTE — Progress Notes (Signed)
Cardiology Office Note:    Date:  01/10/2020   ID:  Craig Finley, DOB Jan 29, 1961, MRN [id number]  PCP:  Craig Nan, NP  [facility] Cardiologist:  Craig Odea, MD  [facility] Electrophysiologist:  None   Referring MD: Craig Nan, NP   Chief Complaint  Patient presents with  . Follow-up    echo  Pt states no Sx.    History of Present Illness:    Craig Finley is a 59 y.o. male with a hx of tobacco use, presenting for follow-up.  Patient last seen due to chest pain.  Symptoms are sometimes related with exertion, and has in the past prompted ER visits.  Occasionally associated with shortness of breath.  Due to symptoms, echocardiogram and coronary CTA was ordered to evaluate cardiac function.  Denies any symptoms currently, would like to get back to exercising.  His blood pressures at home running around 135 systolic.   Past Medical History:  Diagnosis Date  . Allergy   . Blood in stool   . Heart murmur    as child  . Hypertension     Past Surgical History:  Procedure Laterality Date  . ANAL FISSURE REPAIR  05/1998   some type of anal fissure procedure    Current Medications: Current Meds  Medication Sig  . Acetaminophen (TYLENOL PO) Take by mouth as needed.  Marland Kitchen aspirin EC 81 MG tablet Take 81 mg by mouth daily. Swallow whole.   Marland Kitchen Fexofenadine-Pseudoephedrine (ALLEGRA-D PO) Take 1 tablet by mouth daily as needed (allergies).  Marland Kitchen omeprazole (PRILOSEC) 20 MG capsule Take 1 capsule (20 mg total) by mouth daily.  . Sod Picosulfate-Mag Ox-Cit Acd (CLENPIQ) 10-3.5-12 MG-GM -GM/160ML SOLN Take 320 mLs by mouth once for 1 dose.  . [DISCONTINUED] amLODipine (NORVASC) 2.5 MG tablet Take 1 tablet (2.5 mg total) by mouth daily.     Allergies:   Patient has no known allergies.   Social History   Socioeconomic History  . Marital status: Married    Spouse name: Not on file  . Number of children: Not on file  . Years of  education: Not on file  . Highest education level: Some college, no degree  Occupational History  . Occupation: trucker  Tobacco Use  . Smoking status: Never Smoker  . Smokeless tobacco: Current User    Types: Snuff  . Tobacco comment: dipping x 25-30 years  Vaping Use  . Vaping Use: Never used  Substance and Sexual Activity  . Alcohol use: Yes    Comment: drinks 4-7 beers some days and none others. He drinks 24 12 oz can  beers per   . Drug use: Never  . Sexual activity: Yes  Other Topics Concern  . 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 Difficulty of Paying Living Expenses: Not on file  Food Insecurity:   . Worried About Programme researcher, broadcasting/film/video in the Last Year: Not on file  . Ran Out of Food in the Last Year: Not on file  Transportation Needs:   . Lack of Transportation (Medical): Not on file  . Lack of Transportation (Non-Medical): Not on file  Physical Activity:   . Days of Exercise per Week: Not on file  . Minutes of Exercise per Session: Not on file  Stress:   . Feeling of Stress :  Not on file  Social Connections:   . Frequency of Communication with Friends and Family: Not on file  . Frequency of Social Gatherings with Friends and Family: Not on file  . Attends Religious Services: Not on file  . Active Member of Clubs or Organizations: Not on file  . Attends Banker Meetings: Not on file  . Marital Status: Not on file     Family History: The patient's family history includes Alcohol abuse in his maternal grandmother and sister; Arthritis in his mother; Asthma in his daughter, mother, sister, and son; Cancer in his maternal grandfather, maternal grandmother, and sister; Depression in his sister and sister; Drug abuse in his sister; Early death in his sister; Hearing loss in his father; Heart attack in his sister; Heart disease in his  maternal uncle and maternal uncle; Hypertension in his mother and sister; Miscarriages / [place] in his sister.  ROS:   Please see the history of present illness.     All other systems reviewed and are negative.  EKGs/Labs/Other Studies Reviewed:    The following studies were reviewed today:   EKG:  EKG is  ordered today.  The ekg ordered today demonstrates normal sinus rhythm, normal ECG.  Recent Labs: 11/18/2019: ALT 29; BUN 19; Creatinine, Ser 1.51; Hemoglobin 15.6; Platelets 207.0; Potassium 3.6; Sodium 141; TSH 2.13  Recent Lipid Panel    Component Value Date/Time   CHOL 157 11/18/2019 0809   TRIG 134.0 11/18/2019 0809   HDL 42.40 11/18/2019 0809   CHOLHDL 4 11/18/2019 0809   VLDL 26.8 11/18/2019 0809   LDLCALC 88 11/18/2019 0809    Physical Exam:    VS:  BP 130/88   Pulse 60   Ht 5' 6.75" (1.695 m)   Wt 195 lb (88.5 kg)   BMI 30.77 kg/m     Wt Readings from Last 3 Encounters:  01/10/20 195 lb (88.5 kg)  01/07/20 193 lb 4 oz (87.7 kg)  01/06/20 195 lb (88.5 kg)     GEN:  Well nourished, well developed in no acute distress HEENT: Normal NECK: No JVD; No carotid bruits LYMPHATICS: No lymphadenopathy CARDIAC: Regular, bradycardic, no murmurs, rubs, gallops RESPIRATORY:  Clear to auscultation without rales, wheezing or rhonchi  ABDOMEN: Soft, non-tender, non-distended MUSCULOSKELETAL:  No edema; No deformity  SKIN: Warm and dry NEUROLOGIC:  Alert and oriented x 3 PSYCHIATRIC:  Normal affect   ASSESSMENT:    1. Chest pain of uncertain etiology   2. Primary hypertension   3. Tobacco chew use    PLAN:    In order of problems listed above:  1. Patient with history of chest pain consistent with angina pectoris.  He chews tobacco.  Echocardiogram showed normal systolic and diastolic function, EF 55%.  Coronary CTA showed a calcium score of 0, no evidence of CAD.  Patient made aware of results and reassured.  Tobacco cessation advised. 2. History of  hypertension, BP slightly elevated at home readings.  Increase amlodipine to 5 mg daily.  Low-salt diet advised, exercise advised. 3. Patient currently chews tobacco, again, cessation advised.  Follow-up in 1 year.  Total encounter time 40 minutes  Greater than 50% was spent in counseling and coordination of care with the patient    Medication Adjustments/Labs and Tests Ordered: Current medicines are reviewed at length with the patient today.  Concerns regarding medicines are outlined above.  Orders Placed This Encounter  Procedures  . EKG 12-Lead   Meds ordered this encounter  Medications  . amLODipine (NORVASC) 5 MG tablet    Sig: Take 1 tablet (5 mg total) by mouth daily.    Dispense:  90 tablet    Refill:  3    Patient Instructions  Medication Instructions:  Your physician has recommended you make the following change in your medication:  1- INCREASE Amlodipine to 5 mg by mouth once a day.  *If you need a refill on your cardiac medications before your next appointment, please call your pharmacy*  Follow-Up: At [facility], you and your health needs are our priority.  As part of our continuing mission to provide you with exceptional heart care, we have created designated Provider Care Teams.  These Care Teams include your primary Cardiologist (physician) and Advanced Practice Providers (APPs -  Physician Assistants and Nurse Practitioners) who all work together to provide you with the care you need, when you need it.  We recommend signing up for the patient portal called "MyChart".  Sign up information is provided on this After Visit Summary.  MyChart is used to connect with patients for Virtual Visits (Telemedicine).  Patients are able to view lab/test results, encounter notes, upcoming appointments, etc.  Non-urgent messages can be sent to your provider as well.   To learn more about what you can do with MyChart, go to [email or web address].    Your next appointment:     12 month(s)  The format for your next appointment:   In Person  Provider:   You may see Craig Odea, MD or one of the following Advanced Practice Providers on your designated Care Team:    Nicolasa Ducking, NP  Eula Listen, PA-C  Marisue Ivan, PA-C  [place], [place]     Signed, Craig Odea, MD  01/10/2020 12:56 PM    [facility]

## 2020-01-10 NOTE — Progress Notes (Signed)
Did not send on Friday resent it

## 2020-01-14 ENCOUNTER — Telehealth: Payer: Self-pay | Admitting: Nurse Practitioner

## 2020-01-14 NOTE — Telephone Encounter (Signed)
He had his amlodipine increased by Cardiology and needs an OV f/up schedule in 1 month to re check. Thank you.

## 2020-01-17 NOTE — Telephone Encounter (Signed)
LMTCB to schedule appt

## 2020-01-18 NOTE — Telephone Encounter (Signed)
LMTCB to schedule appt

## 2020-01-19 NOTE — Telephone Encounter (Signed)
Patient scheduled follow up for 02/14/20 at 9 am

## 2020-01-27 ENCOUNTER — Other Ambulatory Visit
Admission: RE | Admit: 2020-01-27 | Discharge: 2020-01-27 | Disposition: A | Discharge status: Home / Self Care | Payer: Commercial Managed Care - PPO | Source: Ambulatory Visit | Attending: Gastroenterology | Admitting: Gastroenterology

## 2020-01-27 ENCOUNTER — Other Ambulatory Visit: Payer: Self-pay

## 2020-01-27 DIAGNOSIS — Z01818 Encounter for other preprocedural examination: Secondary | ICD-10-CM | POA: Diagnosis not present

## 2020-01-27 DIAGNOSIS — Z20822 Contact with and (suspected) exposure to covid-19: Secondary | ICD-10-CM | POA: Insufficient documentation

## 2020-01-27 LAB — SARS CORONAVIRUS 2 (TAT 6-24 HRS): SARS Coronavirus 2: NEGATIVE

## 2020-01-28 ENCOUNTER — Encounter: Payer: Self-pay | Admitting: Gastroenterology

## 2020-01-31 ENCOUNTER — Ambulatory Visit
Admission: RE | Admit: 2020-01-31 | Discharge: 2020-01-31 | Disposition: A | Discharge status: Home / Self Care | Payer: Commercial Managed Care - PPO | Attending: Gastroenterology | Admitting: Gastroenterology

## 2020-01-31 ENCOUNTER — Encounter
Admission: RE | Disposition: A | Discharge status: Home / Self Care | Payer: Self-pay | Source: Home / Self Care | Attending: Gastroenterology

## 2020-01-31 ENCOUNTER — Encounter: Payer: Self-pay | Admitting: Gastroenterology

## 2020-01-31 ENCOUNTER — Ambulatory Visit: Payer: Commercial Managed Care - PPO | Admitting: Certified Registered"

## 2020-01-31 ENCOUNTER — Other Ambulatory Visit: Payer: Self-pay

## 2020-01-31 DIAGNOSIS — K648 Other hemorrhoids: Secondary | ICD-10-CM | POA: Diagnosis not present

## 2020-01-31 DIAGNOSIS — Z79899 Other long term (current) drug therapy: Secondary | ICD-10-CM | POA: Insufficient documentation

## 2020-01-31 DIAGNOSIS — Z13810 Encounter for screening for upper gastrointestinal disorder: Secondary | ICD-10-CM | POA: Diagnosis not present

## 2020-01-31 DIAGNOSIS — K644 Residual hemorrhoidal skin tags: Secondary | ICD-10-CM | POA: Diagnosis not present

## 2020-01-31 DIAGNOSIS — K625 Hemorrhage of anus and rectum: Secondary | ICD-10-CM | POA: Insufficient documentation

## 2020-01-31 DIAGNOSIS — K219 Gastro-esophageal reflux disease without esophagitis: Secondary | ICD-10-CM | POA: Diagnosis not present

## 2020-01-31 HISTORY — PX: COLONOSCOPY WITH PROPOFOL: SHX5780

## 2020-01-31 HISTORY — PX: ESOPHAGOGASTRODUODENOSCOPY (EGD) WITH PROPOFOL: SHX5813

## 2020-01-31 SURGERY — COLONOSCOPY WITH PROPOFOL
Anesthesia: General

## 2020-01-31 MED ORDER — PROPOFOL 500 MG/50ML IV EMUL
INTRAVENOUS | Status: AC
  Filled 2020-01-31: qty 50

## 2020-01-31 MED ORDER — LIDOCAINE HCL (CARDIAC) PF 100 MG/5ML IV SOSY
PREFILLED_SYRINGE | INTRAVENOUS | Status: DC | PRN
  Administered 2020-01-31: 50 mg via INTRAVENOUS

## 2020-01-31 MED ORDER — LIDOCAINE HCL (PF) 1 % IJ SOLN
INTRAMUSCULAR | Status: AC
  Filled 2020-01-31: qty 2

## 2020-01-31 MED ORDER — PROPOFOL 10 MG/ML IV BOLUS
INTRAVENOUS | Status: DC | PRN
  Administered 2020-01-31: 70 mg via INTRAVENOUS

## 2020-01-31 MED ORDER — PROPOFOL 500 MG/50ML IV EMUL
INTRAVENOUS | Status: DC | PRN
  Administered 2020-01-31: 150 ug/kg/min via INTRAVENOUS

## 2020-01-31 MED ORDER — SODIUM CHLORIDE 0.9 % IV SOLN
INTRAVENOUS | Status: DC
  Administered 2020-01-31: 1000 mL via INTRAVENOUS

## 2020-01-31 NOTE — Op Note (Signed)
[facility] Patient Name: Craig Finley Procedure Date: 01/31/2020 10:24 AM MRN: [id number] Account #: [id number] Date of Birth: December 24, 1960 Admit Type: Outpatient Age: 59 Room: [facility] ENDO ROOM 1 Gender: Male Note Status: Finalized Procedure:             Colonoscopy Indications:           This is the patient's first colonoscopy Providers:             Toney Reil MD, MD Referring MD:          Ranae Plumber. Arvilla Market, NP (Referring MD) Medicines:             General Anesthesia Complications:         No immediate complications. Estimated blood loss: None. Procedure:             Pre-Anesthesia Assessment:                        - Prior to the procedure, a History and Physical was                         performed, and patient medications and allergies were                         reviewed. The patient is competent. The risks and                         benefits of the procedure and the sedation options and                         risks were discussed with the patient. All questions                         were answered and informed consent was obtained.                         Patient identification and proposed procedure were                         verified by the physician, the nurse, the                         anesthesiologist, the anesthetist and the technician                         in the pre-procedure area in the procedure room in the                         endoscopy suite. Mental Status Examination: alert and                         oriented. Airway Examination: normal oropharyngeal                         airway and neck mobility. Respiratory Examination:                         clear to auscultation. CV Examination: normal.  Prophylactic Antibiotics: The patient does not require                         prophylactic antibiotics. Prior Anticoagulants: The                         patient has taken no previous  anticoagulant or                         antiplatelet agents. ASA Grade Assessment: II - A                         patient with mild systemic disease. After reviewing                         the risks and benefits, the patient was deemed in                         satisfactory condition to undergo the procedure. The                         anesthesia plan was to use general anesthesia.                         Immediately prior to administration of medications,                         the patient was re-assessed for adequacy to receive                         sedatives. The heart rate, respiratory rate, oxygen                         saturations, blood pressure, adequacy of pulmonary                         ventilation, and response to care were monitored                         throughout the procedure. The physical status of the                         patient was re-assessed after the procedure.                        After obtaining informed consent, the colonoscope was                         passed under direct vision. Throughout the procedure,                         the patient's blood pressure, pulse, and oxygen                         saturations were monitored continuously. The                         Colonoscope was introduced through the anus and  advanced to the the cecum, identified by appendiceal                         orifice and ileocecal valve. The colonoscopy was                         performed without difficulty. The patient tolerated                         the procedure well. The quality of the bowel                         preparation was evaluated using the BBPS [facility] Bowel                         Preparation Scale) with scores of: Right Colon = 3,                         Transverse Colon = 3 and Left Colon = 3 (entire mucosa                         seen well with no residual staining, small fragments                         of stool or  opaque liquid). The total BBPS score                         equals 9. Findings:      The perianal and digital rectal examinations were normal. Pertinent       negatives include normal sphincter tone and no palpable rectal lesions.      Non-bleeding external and internal hemorrhoids were found during       retroflexion. The hemorrhoids were medium-sized.      The exam was otherwise without abnormality.      The terminal ileum appeared normal. Impression:            - Non-bleeding external and internal hemorrhoids.                        - The examination was otherwise normal.                        - No specimens collected. Recommendation:        - Discharge patient to home (with escort).                        - Resume previous diet today.                        - Continue present medications.                        - Repeat colonoscopy in 10 years for screening                         purposes. Procedure Code(s):     --- Professional ---  74128, Colonoscopy, flexible; diagnostic, including                         collection of specimen(s) by brushing or washing, when                         performed (separate procedure) Diagnosis Code(s):     --- Professional ---                        K64.8, Other hemorrhoids CPT copyright 2019 American Medical Association. All rights reserved. The codes documented in this report are preliminary and upon coder review may  be revised to meet current compliance requirements. Dr. Libby Maw Toney Reil MD, MD 01/31/2020 10:53:23 AM This report has been signed electronically. Number of Addenda: 0 Note Initiated On: 01/31/2020 10:24 AM Scope Withdrawal Time: 0 hours 9 minutes 5 seconds  Total Procedure Duration: 0 hours 11 minutes 4 seconds  Estimated Blood Loss:  Estimated blood loss: none.      [facility]

## 2020-01-31 NOTE — Anesthesia Postprocedure Evaluation (Signed)
Anesthesia Post Note  Patient: Arlis Porta Bensen  Procedure(s) Performed: COLONOSCOPY WITH PROPOFOL (N/A ) ESOPHAGOGASTRODUODENOSCOPY (EGD) WITH PROPOFOL (N/A )  Patient location during evaluation: Endoscopy Anesthesia Type: General Level of consciousness: awake and alert Pain management: pain level controlled Vital Signs Assessment: post-procedure vital signs reviewed and stable Respiratory status: spontaneous breathing, nonlabored ventilation, respiratory function stable and patient connected to nasal cannula oxygen Cardiovascular status: blood pressure returned to baseline and stable Postop Assessment: no apparent nausea or vomiting Anesthetic complications: no   No complications documented.   Last Vitals:  Vitals:   01/31/20 1100 01/31/20 1110  BP: 106/79 106/84  Pulse: 66 (!) 52  Resp: 16 15  Temp:    SpO2: 99% 100%    Last Pain:  Vitals:   01/31/20 1050  TempSrc: Temporal  PainSc:                  Lenard Simmer

## 2020-01-31 NOTE — Anesthesia Preprocedure Evaluation (Signed)
Anesthesia Evaluation  Patient identified by MRN, date of birth, ID band Patient awake    Reviewed: Allergy & Precautions, H&P , NPO status , Patient's Chart, lab work & pertinent test results, reviewed documented beta blocker date and time   History of Anesthesia Complications Negative for: history of anesthetic complications  Airway Mallampati: I  TM Distance: >3 FB Neck ROM: full    Dental  (+) Dental Advidsory Given, Caps, Missing, Chipped, Poor Dentition   Pulmonary neg pulmonary ROS,    Pulmonary exam normal breath sounds clear to auscultation       Cardiovascular Exercise Tolerance: Good hypertension, (-) angina(-) Past MI and (-) Cardiac Stents Normal cardiovascular exam(-) dysrhythmias + Valvular Problems/Murmurs (as a child)  Rhythm:regular Rate:Normal     Neuro/Psych negative neurological ROS  negative psych ROS   GI/Hepatic Neg liver ROS, GERD  ,  Endo/Other  negative endocrine ROS  Renal/GU negative Renal ROS  negative genitourinary   Musculoskeletal   Abdominal   Peds  Hematology negative hematology ROS (+)   Anesthesia Other Findings Past Medical History: No date: Allergy No date: Blood in stool No date: Heart murmur     Comment:  as child No date: Hypertension   Reproductive/Obstetrics negative OB ROS                             Anesthesia Physical Anesthesia Plan  ASA: II  Anesthesia Plan: General   Post-op Pain Management:    Induction: Intravenous  PONV Risk Score and Plan: 2 and Propofol infusion and TIVA  Airway Management Planned: Natural Airway and Nasal Cannula  Additional Equipment:   Intra-op Plan:   Post-operative Plan:   Informed Consent: I have reviewed the patients History and Physical, chart, labs and discussed the procedure including the risks, benefits and alternatives for the proposed anesthesia with the patient or authorized  representative who has indicated his/her understanding and acceptance.     Dental Advisory Given  Plan Discussed with: Anesthesiologist, CRNA and Surgeon  Anesthesia Plan Comments:         Anesthesia Quick Evaluation

## 2020-01-31 NOTE — Op Note (Signed)
[facility] Gastroenterology Patient Name: Craig Finley Procedure Date: 01/31/2020 10:26 AM MRN: [id number] Account #: [id number] Date of Birth: March 20, 1961 Admit Type: Outpatient Age: 59 Room: [facility] ENDO ROOM 1 Gender: Male Note Status: Finalized Procedure:             Upper GI endoscopy Indications:           Screening for Barrett's esophagus, Follow-up of                         gastro-esophageal reflux disease Providers:             Toney Reil MD, MD Referring MD:          Ranae Plumber. Arvilla Market, NP (Referring MD) Medicines:             General Anesthesia Complications:         No immediate complications. Estimated blood loss: None. Procedure:             Pre-Anesthesia Assessment:                        - Prior to the procedure, a History and Physical was                         performed, and patient medications and allergies were                         reviewed. The patient is competent. The risks and                         benefits of the procedure and the sedation options and                         risks were discussed with the patient. All questions                         were answered and informed consent was obtained.                         Patient identification and proposed procedure were                         verified by the physician, the nurse, the                         anesthesiologist, the anesthetist and the technician                         in the pre-procedure area in the procedure room in the                         endoscopy suite. Mental Status Examination: alert and                         oriented. Airway Examination: normal oropharyngeal                         airway and neck mobility. Respiratory Examination:  clear to auscultation. CV Examination: normal.                         Prophylactic Antibiotics: The patient does not require                         prophylactic antibiotics. Prior  Anticoagulants: The                         patient has taken no previous anticoagulant or                         antiplatelet agents. ASA Grade Assessment: II - A                         patient with mild systemic disease. After reviewing                         the risks and benefits, the patient was deemed in                         satisfactory condition to undergo the procedure. The                         anesthesia plan was to use general anesthesia.                         Immediately prior to administration of medications,                         the patient was re-assessed for adequacy to receive                         sedatives. The heart rate, respiratory rate, oxygen                         saturations, blood pressure, adequacy of pulmonary                         ventilation, and response to care were monitored                         throughout the procedure. The physical status of the                         patient was re-assessed after the procedure.                        After obtaining informed consent, the endoscope was                         passed under direct vision. Throughout the procedure,                         the patient's blood pressure, pulse, and oxygen                         saturations were monitored continuously. The Endoscope  was introduced through the mouth, and advanced to the                         second part of duodenum. The upper GI endoscopy was                         accomplished without difficulty. The patient tolerated                         the procedure well. Findings:      The duodenal bulb and second portion of the duodenum were normal.      The entire examined stomach was normal.      The cardia and gastric fundus were normal on retroflexion.      Esophagogastric landmarks were identified: the gastroesophageal junction       was found at 40 cm from the incisors.      The gastroesophageal junction and  examined esophagus were normal. Impression:            - Normal duodenal bulb and second portion of the                         duodenum.                        - Normal stomach.                        - Esophagogastric landmarks identified.                        - Normal gastroesophageal junction and esophagus.                        - No specimens collected. Recommendation:        - Follow an antireflux regimen.                        - Continue present medications.                        - Proceed with colonoscopy as scheduled                        See colonoscopy report Procedure Code(s):     --- Professional ---                        [phone], Esophagogastroduodenoscopy, flexible,                         transoral; diagnostic, including collection of                         specimen(s) by brushing or washing, when performed                         (separate procedure) Diagnosis Code(s):     --- Professional ---                        J69.678, Encounter for screening for upper  gastrointestinal disorder                        K21.9, Gastro-esophageal reflux disease without                         esophagitis CPT copyright 2019 American Medical Association. All rights reserved. The codes documented in this report are preliminary and upon coder review may  be revised to meet current compliance requirements. Dr. Libby Maw Toney Reil MD, MD 01/31/2020 10:37:51 AM This report has been signed electronically. Number of Addenda: 0 Note Initiated On: 01/31/2020 10:26 AM Estimated Blood Loss:  Estimated blood loss: none.      [facility]

## 2020-01-31 NOTE — Transfer of Care (Signed)
Immediate Anesthesia Transfer of Care Note  Patient: Craig Finley  Procedure(s) Performed: COLONOSCOPY WITH PROPOFOL (N/A ) ESOPHAGOGASTRODUODENOSCOPY (EGD) WITH PROPOFOL (N/A )  Patient Location: PACU and Endoscopy Unit  Anesthesia Type:General  Level of Consciousness: drowsy  Airway & Oxygen Therapy: Patient Spontanous Breathing  Post-op Assessment: Report given to RN and Post -op Vital signs reviewed and stable  Post vital signs: Reviewed and stable  Last Vitals:  Vitals Value Taken Time  BP 97/70 01/31/20 1056  Temp    Pulse 68 01/31/20 1058  Resp 17 01/31/20 1058  SpO2 99 % 01/31/20 1058  Vitals shown include unvalidated device data.  Last Pain:  Vitals:   01/31/20 0956  TempSrc: Temporal  PainSc: 0-No pain         Complications: No complications documented.

## 2020-01-31 NOTE — Anesthesia Procedure Notes (Signed)
Procedure Name: MAC Date/Time: 01/31/2020 10:27 AM Performed by: Jerrye Noble, CRNA Pre-anesthesia Checklist: Patient identified, Emergency Drugs available, Suction available and Patient being monitored Patient Re-evaluated:Patient Re-evaluated prior to induction Oxygen Delivery Method: Nasal cannula

## 2020-01-31 NOTE — H&P (Signed)
Craig Repress, MD [address]  [address]  [place], [place] [address]  Main: [phone]  Fax: [phone] Pager: [phone]  Primary Care Physician:  Craig Nan, NP Primary Gastroenterologist:  Dr. Arlyss Finley  Pre-Procedure History & Physical: HPI:  Craig Finley is a 59 y.o. male is here for an endoscopy and colonoscopy.   Past Medical History:  Diagnosis Date  . Allergy   . Blood in stool   . Heart murmur    as child  . Hypertension     Past Surgical History:  Procedure Laterality Date  . ANAL FISSURE REPAIR  05/1998   some type of anal fissure procedure    Prior to Admission medications   Medication Sig Start Date End Date Taking? Authorizing Provider  Acetaminophen (TYLENOL PO) Take by mouth as needed.    [provider]  amLODipine (NORVASC) 5 MG tablet Take 1 tablet (5 mg total) by mouth daily. 01/10/20 04/09/20  Debbe Odea, MD  aspirin EC 81 MG tablet Take 81 mg by mouth daily. Swallow whole.  Patient not taking: Reported on 01/31/2020    [provider]  Fexofenadine-Pseudoephedrine (ALLEGRA-D PO) Take 1 tablet by mouth daily as needed (allergies).    [provider]  omeprazole (PRILOSEC) 20 MG capsule Take 1 capsule (20 mg total) by mouth daily. 11/04/19   Craig Nan, NP    Allergies as of 01/07/2020  . (No Known Allergies)    Family History  Problem Relation Age of Onset  . Heart disease Maternal Uncle   . Heart disease Maternal Uncle   . Arthritis Mother   . Asthma Mother   . Hypertension Mother   . Hearing loss Father   . Alcohol abuse Sister   . Cancer Sister   . Depression Sister   . Drug abuse Sister   . Early death Sister   . Heart attack Sister   . Asthma Daughter   . Asthma Son   . Alcohol abuse Maternal Grandmother   . Cancer Maternal Grandmother   . Cancer Maternal Grandfather   . Hypertension Sister   . Miscarriages / Stillbirths Sister   . Asthma Sister   .  Depression Sister     Social History   Socioeconomic History  . Marital status: Married    Spouse name: Not on file  . Number of children: Not on file  . Years of education: Not on file  . Highest education level: Some college, no degree  Occupational History  . Occupation: trucker  Tobacco Use  . Smoking status: Never Smoker  . Smokeless tobacco: Current User    Types: Snuff  . Tobacco comment: dipping x 25-30 years  Vaping Use  . Vaping Use: Never used  Substance and Sexual Activity  . Alcohol use: Yes    Comment: drinks 4-7 beers some days and none others. He drinks 24 12 oz can  beers per   . Drug use: Never  . Sexual activity: Yes  Other Topics Concern  . 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 Difficulty of Paying Living Expenses: Not on file  Food Insecurity:   . Worried About Programme researcher, broadcasting/film/video in the Last Year: Not on file  . Ran Out of Food in the Last Year: Not on file  Transportation Needs:   .  Lack of Transportation (Medical): Not on file  . Lack of Transportation (Non-Medical): Not on file  Physical Activity:   . Days of Exercise per Week: Not on file  . Minutes of Exercise per Session: Not on file  Stress:   . Feeling of Stress : Not on file  Social Connections:   . Frequency of Communication with Friends and Family: Not on file  . Frequency of Social Gatherings with Friends and Family: Not on file  . Attends Religious Services: Not on file  . Active Member of Clubs or Organizations: Not on file  . Attends Banker Meetings: Not on file  . Marital Status: Not on file  Intimate Partner Violence:   . Fear of Current or Ex-Partner: Not on file  . Emotionally Abused: Not on file  . Physically Abused: Not on file  . Sexually Abused: Not on file    Review of Systems: See HPI, otherwise negative ROS  Physical Exam: BP  130/79   Pulse (!) 58   Temp (!) 97.2 F (36.2 C) (Temporal)   Resp 17   Ht 6' (1.829 m)   Wt 88.5 kg   SpO2 100%   BMI 26.45 kg/m  General:   Alert,  pleasant and cooperative in NAD Head:  Normocephalic and atraumatic. Neck:  Supple; no masses or thyromegaly. Lungs:  Clear throughout to auscultation.    Heart:  Regular rate and rhythm. Abdomen:  Soft, nontender and nondistended. Normal bowel sounds, without guarding, and without rebound.   Neurologic:  Alert and  oriented x4;  grossly normal neurologically.  Impression/Plan: Craig Finley is here for an endoscopy and colonoscopy to be performed for chronic GERD and rectal bleeding  Risks, benefits, limitations, and alternatives regarding  endoscopy and colonoscopy have been reviewed with the patient.  Questions have been answered.  All parties agreeable.   Lannette Donath, MD  01/31/2020, 10:15 AM

## 2020-02-02 ENCOUNTER — Encounter: Payer: Self-pay | Admitting: Gastroenterology

## 2020-02-09 ENCOUNTER — Other Ambulatory Visit: Payer: Self-pay

## 2020-02-14 ENCOUNTER — Other Ambulatory Visit: Payer: Self-pay

## 2020-02-14 ENCOUNTER — Encounter: Payer: Self-pay | Admitting: Nurse Practitioner

## 2020-02-14 ENCOUNTER — Ambulatory Visit: Payer: Commercial Managed Care - PPO | Admitting: Nurse Practitioner

## 2020-02-14 VITALS — BP 130/80 | HR 71 | Temp 98.7°F | Ht 67.0 in | Wt 197.0 lb

## 2020-02-14 DIAGNOSIS — I10 Essential (primary) hypertension: Secondary | ICD-10-CM

## 2020-02-14 DIAGNOSIS — J3089 Other allergic rhinitis: Secondary | ICD-10-CM

## 2020-02-14 DIAGNOSIS — R7989 Other specified abnormal findings of blood chemistry: Secondary | ICD-10-CM | POA: Diagnosis not present

## 2020-02-14 DIAGNOSIS — Z23 Encounter for immunization: Secondary | ICD-10-CM

## 2020-02-14 DIAGNOSIS — Z0001 Encounter for general adult medical examination with abnormal findings: Secondary | ICD-10-CM | POA: Diagnosis not present

## 2020-02-14 DIAGNOSIS — Z0000 Encounter for general adult medical examination without abnormal findings: Secondary | ICD-10-CM

## 2020-02-14 DIAGNOSIS — R918 Other nonspecific abnormal finding of lung field: Secondary | ICD-10-CM

## 2020-02-14 LAB — BASIC METABOLIC PANEL
BUN: 10 mg/dL (ref 6–23)
CO2: 30 mEq/L (ref 19–32)
Calcium: 8.9 mg/dL (ref 8.4–10.5)
Chloride: 104 mEq/L (ref 96–112)
Creatinine, Ser: 1.22 mg/dL (ref 0.40–1.50)
GFR: 65.11 mL/min (ref >60.00)
Glucose, Bld: 98 mg/dL (ref 70–99)
Potassium: 4.4 mEq/L (ref 3.5–5.1)
Sodium: 140 mEq/L (ref 135–145)

## 2020-02-14 MED ORDER — TRIAMCINOLONE ACETONIDE 55 MCG/ACT NA AERO: 2.0000 | INHALATION_SPRAY | Freq: Every day | NASAL | 4 refills | Status: DC

## 2020-02-14 NOTE — Progress Notes (Signed)
Established Patient Office Visit  Subjective:  Patient ID: Craig Finley, male    DOB: 04/16/1960  Age: 59 y.o. MRN: [id number]  CC:  Chief Complaint  Patient presents with  . Follow-up    hypertension    HPI Craig SpragueWilliam Mcarthur Dalton is a 59 yo who established care in August following an ER visit for chest pain.  He had a history of GERD, anal fissure, mild intermittent constipation, rectal bleeding, allergic rhinitis, chewing tobacco, hypertension. He has been seen by Cardiology and Gastroenterology.  He has a history of seasonal allergies in spring and fall, has a little bit of a cough every now and then takes DayQuil at night with improvement.   HTN: Maintained on amlodipine 5 mg daily. Does not check BP at home. Low-salt diet advised, exercise advised.  He denies any chest pain, pressure, heaviness or tightness.  No DOE.  No edema.  Tolerating medication well.  History of mildly elevated creatinine, he discontinued his NSAIDs, hydrated, he is not on diuretics, creatinine has improved. Continues with chewing tobacco- wants to quit. Decreased alcohol consumption.   Lab Results  Component Value Date   MICROALBUR <0.7 02/14/2020   Lab Results  Component Value Date   CREATININE 1.22 02/14/2020   BP Readings from Last 3 Encounters:  02/14/20 130/80  01/31/20 106/84  01/10/20 130/88   12/23/2019 coronary CTA:  Coronary calcium score of zero. Small nodules in the left lung, largest measures 4 mm.  No follow-up needs of patient is low risk (has no known or suspected primary neoplasm.) Non contrast chest CT can be considered in 12 months if patient is high risk.  Also incidental finding dependent densities or atelectasis in the lower lungs.  He had a  2.4 cm hepatic cyst.  01/05/2020: Chest pain and SOB evaluation: Echocardiogram showed normal systolic and diastolic function, EF 55%.  Coronary CTA showed calcium score of 0, no evidence of CAD. It also showed incidental lung  nodules and a hepatic cyst. He is not on a statin.   The 10-year ASCVD risk score Denman George(Goff DC Montez HagemanJr., et al., 2013) is: 13%   Values used to calculate the score:     Age: 59 years     Sex: Male     Is Non-Hispanic African American: Yes     Diabetic: No     Tobacco smoker: No     Systolic Blood Pressure: 130 mmHg     Is BP treated: Yes     HDL Cholesterol: 42.4 mg/dL     Total Cholesterol: 157 mg/dL .   Positive for pulmonary nodules largest measuring 4 mm.  The patient does not have a personal history of lung cancer.  He has had several decades of chewing tobacco.  We will ask the cancer nurse navigator to follow-up on this CT finding for any need for further recommended imaging studies.  Tobacco cessation advised.  01/31/2020: Colonoscopy for history of bright red blood per rectum, mild constipation, and first colonoscopy revealed internal and external hemorrhoids, exam otherwise normal.  No specimens collected.  01/31/2020: Upper endoscopy for GERD, rule out Barrett's, revealed normal studies.  No specimens collected.  Continue with antireflux regimen.   Patient presents today for complete physical.  Immunizations: Pfizer vaccine 06/07/2019 and 06/29/2019, due for Tdap today.  Willing to get shingles vaccine in the future.  Completed flu vaccine. Diet: regular  Exercise: no regular  Colonoscopy: 01/31/20 EGD: 01/31/20 PSA: 0.83 on 11/18/2019 Dentist: Not UTD Vision:  Not UTD Tobacco: chewing tobacco Etoh: Drinks  2 beers Wed, 2-3 beers Fri, Sat 3-4 beers , Sun 3-4 beers.   Past Medical History:  Diagnosis Date  . Allergy   . Blood in stool   . Heart murmur    as child  . Hypertension     Past Surgical History:  Procedure Laterality Date  . ANAL FISSURE REPAIR  05/1998   some type of anal fissure procedure  . COLONOSCOPY WITH PROPOFOL N/A 01/31/2020   Procedure: COLONOSCOPY WITH PROPOFOL;  Surgeon: Toney Reil, MD;  Location: [facility] ENDOSCOPY;  Service: Gastroenterology;   Laterality: N/A;  . ESOPHAGOGASTRODUODENOSCOPY (EGD) WITH PROPOFOL N/A 01/31/2020   Procedure: ESOPHAGOGASTRODUODENOSCOPY (EGD) WITH PROPOFOL;  Surgeon: Toney Reil, MD;  Location: [facility] ENDOSCOPY;  Service: Gastroenterology;  Laterality: N/A;    Family History  Problem Relation Age of Onset  . Heart disease Maternal Uncle   . Heart disease Maternal Uncle   . Arthritis Mother   . Asthma Mother   . Hypertension Mother   . Hearing loss Father   . Alcohol abuse Sister   . Cancer Sister   . Depression Sister   . Drug abuse Sister   . Early death Sister   . Heart attack Sister   . Asthma Daughter   . Asthma Son   . Alcohol abuse Maternal Grandmother   . Cancer Maternal Grandmother   . Cancer Maternal Grandfather   . Hypertension Sister   . Miscarriages / Stillbirths Sister   . Asthma Sister   . Depression Sister     Social History   Socioeconomic History  . Marital status: Married    Spouse name: Not on file  . Number of children: Not on file  . Years of education: Not on file  . Highest education level: Some college, no degree  Occupational History  . Occupation: trucker  Tobacco Use  . Smoking status: Never Smoker  . Smokeless tobacco: Current User    Types: Snuff  . Tobacco comment: dipping x 25-30 years  Vaping Use  . Vaping Use: Never used  Substance and Sexual Activity  . Alcohol use: Yes    Comment: drinks 4-7 beers some days and none others. He drinks 24 12 oz can  beers per   . Drug use: Never  . Sexual activity: Yes  Other Topics Concern  . 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 Difficulty of Paying Living Expenses: Not on file  Food Insecurity:   . Worried About Programme researcher, broadcasting/film/video in the Last Year: Not on file  . Ran Out of Food in the Last Year: Not on file  Transportation Needs:   . Lack of Transportation (Medical): Not  on file  . Lack of Transportation (Non-Medical): Not on file  Physical Activity:   . Days of Exercise per Week: Not on file  . Minutes of Exercise per Session: Not on file  Stress:   . Feeling of Stress : Not on file  Social Connections:   . Frequency of Communication with Friends and Family: Not on file  . Frequency of Social Gatherings with Friends and Family: Not on file  . Attends Religious Services: Not on file  . Active Member of Clubs or Organizations: Not on file  . Attends Banker Meetings: Not on file  .  Marital Status: Not on file  Intimate Partner Violence:   . Fear of Current or Ex-Partner: Not on file  . Emotionally Abused: Not on file  . Physically Abused: Not on file  . Sexually Abused: Not on file    Outpatient Medications Prior to Visit  Medication Sig Dispense Refill  . Acetaminophen (TYLENOL PO) Take by mouth as needed.    Marland Kitchen amLODipine (NORVASC) 5 MG tablet Take 1 tablet (5 mg total) by mouth daily. 90 tablet 3  . aspirin EC 81 MG tablet Take 81 mg by mouth daily. Swallow whole.    Marland Kitchen Fexofenadine-Pseudoephedrine (ALLEGRA-D PO) Take 1 tablet by mouth daily as needed (allergies).    Marland Kitchen omeprazole (PRILOSEC) 20 MG capsule Take 1 capsule (20 mg total) by mouth daily. 30 capsule 3   No facility-administered medications prior to visit.    No Known Allergies  Review of Systems  Constitutional: Negative for chills and fever.  HENT: Negative for congestion.   Eyes: Negative.   Respiratory: Negative for cough and shortness of breath.   Cardiovascular: Negative for chest pain, palpitations and leg swelling.  Gastrointestinal: Negative for abdominal pain, blood in stool and constipation.  Endocrine: Negative.   Genitourinary: Negative.   Musculoskeletal: Negative.   Skin: Negative.   Allergic/Immunologic: Positive for environmental allergies.  Neurological: Negative.   Hematological: Negative.   Psychiatric/Behavioral:       No concerns with  depression or anxiety.      Objective:    Physical Exam Vitals reviewed.  Constitutional:      Appearance: He is normal weight.  HENT:     Head: Normocephalic.  Cardiovascular:     Rate and Rhythm: Normal rate and regular rhythm.     Pulses: Normal pulses.     Heart sounds: Normal heart sounds.  Pulmonary:     Effort: Pulmonary effort is normal.     Breath sounds: Normal breath sounds.  Abdominal:     Palpations: Abdomen is soft.     Tenderness: There is no abdominal tenderness.  Musculoskeletal:        General: Normal range of motion.     Cervical back: Normal range of motion.  Skin:    General: Skin is warm and dry.  Neurological:     General: No focal deficit present.     Mental Status: He is alert and oriented to person, place, and time.  Psychiatric:        Mood and Affect: Mood normal.        Behavior: Behavior normal.     BP 130/80 (BP Location: Left Arm, Patient Position: Sitting, Cuff Size: Normal)   Pulse 71   Temp 98.7 F (37.1 C) (Oral)   Ht 5\' 7"  (1.702 m)   Wt 197 lb (89.4 kg)   SpO2 98%   BMI 30.85 kg/m  Wt Readings from Last 3 Encounters:  02/14/20 197 lb (89.4 kg)  01/31/20 195 lb (88.5 kg)  01/10/20 195 lb (88.5 kg)   Pulse Readings from Last 3 Encounters:  02/14/20 71  01/31/20 (!) 52  01/10/20 60    BP Readings from Last 3 Encounters:  02/14/20 130/80  01/31/20 106/84  01/10/20 130/88    Lab Results  Component Value Date   CHOL 157 11/18/2019   HDL 42.40 11/18/2019   LDLCALC 88 11/18/2019   TRIG 134.0 11/18/2019   CHOLHDL 4 11/18/2019    There are no preventive care reminders to display for this patient.  Lab Results  Component Value Date   TSH 2.13 11/18/2019   Lab Results  Component Value Date   WBC 5.2 11/18/2019   HGB 15.6 11/18/2019   HCT 45.4 11/18/2019   MCV 87.9 11/18/2019   PLT 207.0 11/18/2019   Lab Results  Component Value Date   NA 140 02/14/2020   K 4.4 02/14/2020   CO2 30 02/14/2020   GLUCOSE 98  02/14/2020   BUN 10 02/14/2020   CREATININE 1.22 02/14/2020   BILITOT 0.9 11/18/2019   ALKPHOS 37 (L) 11/18/2019   AST 30 11/18/2019   ALT 29 11/18/2019   PROT 6.8 11/18/2019   ALBUMIN 4.1 11/18/2019   CALCIUM 8.9 02/14/2020   ANIONGAP 9 11/03/2019   GFR 65.11 02/14/2020   Lab Results  Component Value Date   CHOL 157 11/18/2019   Lab Results  Component Value Date   HDL 42.40 11/18/2019   Lab Results  Component Value Date   LDLCALC 88 11/18/2019   Lab Results  Component Value Date   TRIG 134.0 11/18/2019   Lab Results  Component Value Date   CHOLHDL 4 11/18/2019   Lab Results  Component Value Date   HGBA1C 5.5 11/18/2019      Assessment & Plan:   Problem List Items Addressed This Visit      Cardiovascular and Mediastinum   Essential hypertension - Primary   Relevant Orders   Basic metabolic panel (Completed)   Microalbumin / creatinine urine ratio (Completed)   Urinalysis, Routine w reflex microscopic     Respiratory   Non-seasonal allergic rhinitis     Other   Encounter for screening and preventative care   Elevated serum creatinine   Relevant Orders   Basic metabolic panel (Completed)   Microalbumin / creatinine urine ratio (Completed)   Urinalysis, Routine w reflex microscopic   Pulmonary nodules    Other Visit Diagnoses    Need for Tdap vaccination       Relevant Orders   Tdap vaccine greater than or equal to 7yo IM (Completed)     He is feeling well and comfortable with current therapy.  When calculating ASCVD 10- year risk- it is elevated without a statin and current BP reading. His calcium CT score is zero. He started on the amlodipine 3 months ago and is making lifestyle changes. If BP is not <130/85 at next OV- recommend increasing BP medication. He is advised to check BP at home and bring readings in to the next visit. Chewing tobacco cessation and consider referral to CCM to follow. Nurse visit in 2 weeks for Shingles vaccine.    Pulmonary nodules noted and he does not have a personal hx of cancer. Will check with [facility] Nurse Navigator re follow up.  Patient was advised: To the lab today  I stopped your Flonase and switches to Nasacort to see if that works. Call back if it does not help your post nasal drainage at night.   Tdap vaccine.   Please come in for nurse visit to get Shingrix vaccine   Smoking cessation information provided with [place] quit line, 1 [phone] and call for more information, - they can give samples and loads of information.  Cut back on chewing tobacco.  Use the Nicotine patch and place a new one on every morning. Skin will be red under the patch. Use different sites. If get nightmares at night- take off patch at bedtime and call us for further suggestions.   Consider chewing nicotine gum or lozenges  in place of chewing tobacco when you get the urge.  You would use the lowest dose possible 2 mg dose.  Weeks 1-6: use 1 piece every 1-2 hours.   Weeks 7-8 use 1 piece every 2-4 hours  Weeks 10-12 1 piece every 4-8 hours Duration of therapy is 12 weeks.  Please see your eye doctor and dentist as we discussed.   Follow-up: Return in about 4 months (around 06/13/2020) for Nurse visit for Shingles vaccine in next week or two, .   This visit occurred during the SARS-CoV-2 public health emergency.  Safety protocols were in place, including screening questions prior to the visit, additional usage of staff PPE, and extensive cleaning of exam room while observing appropriate contact time as indicated for disinfecting solutions.    Amedeo Kinsman, NP

## 2020-02-14 NOTE — Patient Instructions (Addendum)
To the lab today  I stopped your Flonase and switches to Nasacort to see if that works. Call back if it does not help your post nasal drainage at night.   Tdap vaccine.   Please come in for nurse visit to get Shingrix vaccine   Smoking cessation information provided with [facility] quit line, 1 [phone] and call for more information, - they can give samples and loads of information.  Cut back on chewing tobacco when plase the patch and use the gum in place of the tobacco . Use the Nicotine patch and place on every morning.  If get nightmares at night- take off patch at bedtime and call us for further suggestions.    Consider chewing nicotine gum or lozenges in place of chewing tobacco. .  You would use the lowest dose possible 2 mg dose.    Weeks 1-6: use 1 piece every 1-2 hours.   Weeks 7-8 use 1 piece every 2-4 hours  Weeks 10-12 1 piece every 4-8 hours Duration of therapy is 12 weeks.  Please see your eye doctor and dentist as we discussed.   Allergic Rhinitis, Adult Allergic rhinitis is an allergic reaction that affects the mucous membrane inside the nose. It causes sneezing, a runny or stuffy nose, and the feeling of mucus going down the back of the throat (postnasal drip). Allergic rhinitis can be mild to severe. There are two types of allergic rhinitis:  Seasonal. This type is also called hay fever. It happens only during certain seasons.  Perennial. This type can happen at any time of the year. What are the causes? This condition happens when the body's defense system (immune system) responds to certain harmless substances called allergens as though they were germs.  Seasonal allergic rhinitis is triggered by pollen, which can come from grasses, trees, and weeds. Perennial allergic rhinitis may be caused by:  House dust mites.  Pet dander.  Mold spores. What are the signs or symptoms? Symptoms of this condition include:  Sneezing.  Runny or stuffy nose (nasal  congestion).  Postnasal drip.  Itchy nose.  Tearing of the eyes.  Trouble sleeping.  Daytime sleepiness. How is this diagnosed? This condition may be diagnosed based on:  Your medical history.  A physical exam.  Tests to check for related conditions, such as: ? Asthma. ? Pink eye. ? Ear infection. ? Upper respiratory infection.  Tests to find out which allergens trigger your symptoms. These may include skin or blood tests. How is this treated? There is no cure for this condition, but treatment can help control symptoms. Treatment may include:  Taking medicines that block allergy symptoms, such as antihistamines. Medicine may be given as a shot, nasal spray, or pill.  Avoiding the allergen.  Desensitization. This treatment involves getting ongoing shots until your body becomes less sensitive to the allergen. This treatment may be done if other treatments do not help.  If taking medicine and avoiding the allergen does not work, new, stronger medicines may be prescribed. Follow these instructions at home:  Find out what you are allergic to. Common allergens include smoke, dust, and pollen.  Avoid the things you are allergic to. These are some things you can do to help avoid allergens: ? Replace carpet with wood, tile, or vinyl flooring. Carpet can trap dander and dust. ? Do not smoke. Do not allow smoking in your home. ? Change your heating and air conditioning filter at least once a month. ? During allergy season:  Keep windows closed as much as possible.  Plan outdoor activities when pollen counts are lowest. This is usually during the evening hours.  When coming indoors, change clothing and shower before sitting on furniture or bedding.  Take over-the-counter and prescription medicines only as told by your health care provider.  Keep all follow-up visits as told by your health care provider. This is important. Contact a health care provider if:  You have a  fever.  You develop a persistent cough.  You make whistling sounds when you breathe (you wheeze).  Your symptoms interfere with your normal daily activities. Get help right away if:  You have shortness of breath. Summary  This condition can be managed by taking medicines as directed and avoiding allergens.  Contact your health care provider if you develop a persistent cough or fever.  During allergy season, keep windows closed as much as possible. This information is not intended to replace advice given to you by your health care provider. Make sure you discuss any questions you have with your health care provider. Document Revised: 02/21/2017 Document Reviewed: 04/18/2016 Elsevier Patient Education  2020 [employer].  Managing Your Hypertension Hypertension is commonly called high blood pressure. This is when the force of your blood pressing against the walls of your arteries is too strong. Arteries are blood vessels that carry blood from your heart throughout your body. Hypertension forces the heart to work harder to pump blood, and may cause the arteries to become narrow or stiff. Having untreated or uncontrolled hypertension can cause heart attack, stroke, kidney disease, and other problems. What are blood pressure readings? A blood pressure reading consists of a higher number over a lower number. Ideally, your blood pressure should be below 120/80. The first ("top") number is called the systolic pressure. It is a measure of the pressure in your arteries as your heart beats. The second ("bottom") number is called the diastolic pressure. It is a measure of the pressure in your arteries as the heart relaxes. What does my blood pressure reading mean? Blood pressure is classified into four stages. Based on your blood pressure reading, your health care provider may use the following stages to determine what type of treatment you need, if any. Systolic pressure and diastolic pressure are  measured in a unit called mm Hg. Normal  Systolic pressure: below 379.  Diastolic pressure: below 80. Elevated  Systolic pressure: 024-097.  Diastolic pressure: below 80. Hypertension stage 1  Systolic pressure: 353-299.  Diastolic pressure: 24-26. Hypertension stage 2  Systolic pressure: 834 or above.  Diastolic pressure: 90 or above. What health risks are associated with hypertension? Managing your hypertension is an important responsibility. Uncontrolled hypertension can lead to:  A heart attack.  A stroke.  A weakened blood vessel (aneurysm).  Heart failure.  Kidney damage.  Eye damage.  Metabolic syndrome.  Memory and concentration problems. What changes can I make to manage my hypertension? Hypertension can be managed by making lifestyle changes and possibly by taking medicines. Your health care provider will help you make a plan to bring your blood pressure within a normal range. Eating and drinking   Eat a diet that is high in fiber and potassium, and low in salt (sodium), added sugar, and fat. An example eating plan is called the DASH (Dietary Approaches to Stop Hypertension) diet. To eat this way: ? Eat plenty of fresh fruits and vegetables. Try to fill half of your plate at each meal with fruits and vegetables. ?  Eat whole grains, such as whole wheat pasta, brown rice, or whole grain bread. Fill about one quarter of your plate with whole grains. ? Eat low-fat diary products. ? Avoid fatty cuts of meat, processed or cured meats, and poultry with skin. Fill about one quarter of your plate with lean proteins such as fish, chicken without skin, beans, eggs, and tofu. ? Avoid premade and processed foods. These tend to be higher in sodium, added sugar, and fat.  Reduce your daily sodium intake. Most people with hypertension should eat less than 1,500 mg of sodium a day.  Limit alcohol intake to no more than 1 drink a day for nonpregnant women and 2 drinks a  day for men. One drink equals 12 oz of beer, 5 oz of wine, or 1 oz of hard liquor. Lifestyle  Work with your health care provider to maintain a healthy body weight, or to lose weight. Ask what an ideal weight is for you.  Get at least 30 minutes of exercise that causes your heart to beat faster (aerobic exercise) most days of the week. Activities may include walking, swimming, or biking.  Include exercise to strengthen your muscles (resistance exercise), such as weight lifting, as part of your weekly exercise routine. Try to do these types of exercises for 30 minutes at least 3 days a week.  Do not use any products that contain nicotine or tobacco, such as cigarettes and e-cigarettes. If you need help quitting, ask your health care provider.  Control any long-term (chronic) conditions you have, such as high cholesterol or diabetes. Monitoring  Monitor your blood pressure at home as told by your health care provider. Your personal target blood pressure may vary depending on your medical conditions, your age, and other factors.  Have your blood pressure checked regularly, as often as told by your health care provider. Working with your health care provider  Review all the medicines you take with your health care provider because there may be side effects or interactions.  Talk with your health care provider about your diet, exercise habits, and other lifestyle factors that may be contributing to hypertension.  Visit your health care provider regularly. Your health care provider can help you create and adjust your plan for managing hypertension. Will I need medicine to control my blood pressure? Your health care provider may prescribe medicine if lifestyle changes are not enough to get your blood pressure under control, and if:  Your systolic blood pressure is 130 or higher.  Your diastolic blood pressure is 80 or higher. Take medicines only as told by your health care provider. Follow  the directions carefully. Blood pressure medicines must be taken as prescribed. The medicine does not work as well when you skip doses. Skipping doses also puts you at risk for problems. Contact a health care provider if:  You think you are having a reaction to medicines you have taken.  You have repeated (recurrent) headaches.  You feel dizzy.  You have swelling in your ankles.  You have trouble with your vision. Get help right away if:  You develop a severe headache or confusion.  You have unusual weakness or numbness, or you feel faint.  You have severe pain in your chest or abdomen.  You vomit repeatedly.  You have trouble breathing. Summary  Hypertension is when the force of blood pumping through your arteries is too strong. If this condition is not controlled, it may put you at risk for serious complications.  Your  personal target blood pressure may vary depending on your medical conditions, your age, and other factors. For most people, a normal blood pressure is less than 120/80.  Hypertension is managed by lifestyle changes, medicines, or both. Lifestyle changes include weight loss, eating a healthy, low-sodium diet, exercising more, and limiting alcohol. This information is not intended to replace advice given to you by your health care provider. Make sure you discuss any questions you have with your health care provider. Document Revised: 07/03/2018 Document Reviewed: 02/07/2016 Elsevier Patient Education  2020 [employer] Inc[employer]  Preventive Care 88-63 Years Old, Male Preventive care refers to lifestyle choices and visits with your health care provider that can promote health and wellness. This includes:  A yearly physical exam. This is also called an annual well check.  Regular dental and eye exams.  Immunizations.  Screening for certain conditions.  Healthy lifestyle choices, such as eating a healthy diet, getting regular exercise, not using drugs or products that  contain nicotine and tobacco, and limiting alcohol use. What can I expect for my preventive care visit? Physical exam Your health care provider will check:  Height and weight. These may be used to calculate body mass index (BMI), which is a measurement that tells if you are at a healthy weight.  Heart rate and blood pressure.  Your skin for abnormal spots. Counseling Your health care provider may ask you questions about:  Alcohol, tobacco, and drug use.  Emotional well-being.  Home and relationship well-being.  Sexual activity.  Eating habits.  Work and work Statistician. What immunizations do I need?  Influenza (flu) vaccine  This is recommended every year. Tetanus, diphtheria, and pertussis (Tdap) vaccine  You may need a Td booster every 10 years. Varicella (chickenpox) vaccine  You may need this vaccine if you have not already been vaccinated. Zoster (shingles) vaccine  You may need this after age 15. Measles, mumps, and rubella (MMR) vaccine  You may need at least one dose of MMR if you were born in 1957 or later. You may also need a second dose. Pneumococcal conjugate (PCV13) vaccine  You may need this if you have certain conditions and were not previously vaccinated. Pneumococcal polysaccharide (PPSV23) vaccine  You may need one or two doses if you smoke cigarettes or if you have certain conditions. Meningococcal conjugate (MenACWY) vaccine  You may need this if you have certain conditions. Hepatitis A vaccine  You may need this if you have certain conditions or if you travel or work in places where you may be exposed to hepatitis A. Hepatitis B vaccine  You may need this if you have certain conditions or if you travel or work in places where you may be exposed to hepatitis B. Haemophilus influenzae type b (Hib) vaccine  You may need this if you have certain risk factors. Human papillomavirus (HPV) vaccine  If recommended by your health care provider,  you may need three doses over 6 months. You may receive vaccines as individual doses or as more than one vaccine together in one shot (combination vaccines). Talk with your health care provider about the risks and benefits of combination vaccines. What tests do I need? Blood tests  Lipid and cholesterol levels. These may be checked every 5 years, or more frequently if you are over 37 years old.  Hepatitis C test.  Hepatitis B test. Screening  Lung cancer screening. You may have this screening every year starting at age 71 if you have a 30-pack-year history of  smoking and currently smoke or have quit within the past 15 years.  Prostate cancer screening. Recommendations will vary depending on your family history and other risks.  Colorectal cancer screening. All adults should have this screening starting at age 49 and continuing until age 22. Your health care provider may recommend screening at age 27 if you are at increased risk. You will have tests every 1-10 years, depending on your results and the type of screening test.  Diabetes screening. This is done by checking your blood sugar (glucose) after you have not eaten for a while (fasting). You may have this done every 1-3 years.  Sexually transmitted disease (STD) testing. Follow these instructions at home: Eating and drinking  Eat a diet that includes fresh fruits and vegetables, whole grains, lean protein, and low-fat dairy products.  Take vitamin and mineral supplements as recommended by your health care provider.  Do not drink alcohol if your health care provider tells you not to drink.  If you drink alcohol: ? Limit how much you have to 0-2 drinks a day. ? Be aware of how much alcohol is in your drink. In the [place] one drink equals one 12 oz bottle of beer (355 mL), one 5 oz glass of wine (148 mL), or one 1 oz glass of hard liquor (44 mL). Lifestyle  Take daily care of your teeth and gums.  Stay active. Exercise for at  least 30 minutes on 5 or more days each week.  Do not use any products that contain nicotine or tobacco, such as cigarettes, e-cigarettes, and chewing tobacco. If you need help quitting, ask your health care provider.  If you are sexually active, practice safe sex. Use a condom or other form of protection to prevent STIs (sexually transmitted infections).  Talk with your health care provider about taking a low-dose aspirin every day starting at age 48. What's next?  Go to your health care provider once a year for a well check visit.  Ask your health care provider how often you should have your eyes and teeth checked.  Stay up to date on all vaccines. This information is not intended to replace advice given to you by your health care provider. Make sure you discuss any questions you have with your health care provider. Document Revised: 03/05/2018 Document Reviewed: 03/05/2018 Elsevier Patient Education  2020 [employer].

## 2020-02-15 LAB — MICROALBUMIN / CREATININE URINE RATIO
Creatinine,U: 89.4 mg/dL
Microalb Creat Ratio: 0.8 mg/g (ref 0.0–30.0)
Microalb, Ur: <0.7 mg/dL (ref 0.0–1.9)

## 2020-02-22 ENCOUNTER — Telehealth: Payer: Self-pay | Admitting: Nurse Practitioner

## 2020-02-22 DIAGNOSIS — R918 Other nonspecific abnormal finding of lung field: Secondary | ICD-10-CM

## 2020-02-22 NOTE — Telephone Encounter (Signed)
  Please  let him know I placed the referral for his pulmonary nodules.

## 2020-02-22 NOTE — Telephone Encounter (Signed)
LMTCB

## 2020-02-24 ENCOUNTER — Ambulatory Visit (INDEPENDENT_AMBULATORY_CARE_PROVIDER_SITE_OTHER): Payer: Commercial Managed Care - PPO

## 2020-02-24 ENCOUNTER — Other Ambulatory Visit: Payer: Self-pay

## 2020-02-24 DIAGNOSIS — Z23 Encounter for immunization: Secondary | ICD-10-CM | POA: Diagnosis not present

## 2020-02-24 NOTE — Progress Notes (Signed)
Patient presented for Shingrix injection to left deltoid, patient voiced no concerns nor showed any signs of distress during injection. 

## 2020-02-28 NOTE — Telephone Encounter (Signed)
Patient aware of referral

## 2020-05-01 ENCOUNTER — Telehealth: Payer: Self-pay | Admitting: Nurse Practitioner

## 2020-05-01 NOTE — Telephone Encounter (Signed)
Copied from CRM [id number]. Topic: General - Other >> May 01, 2020  1:06 PM Gwenlyn Fudge wrote: Reason for CRM: Pt calling and is requesting to have a New Patient appt. He states that he was a pt of Amedeo Kinsman. Please advise.

## 2020-05-01 NOTE — Telephone Encounter (Signed)
Patient called to confirm nurse visit. Patient was advised that Fransico Setters was no longer in office. [facility] phone number was given to patient.

## 2020-05-04 ENCOUNTER — Ambulatory Visit: Payer: Self-pay

## 2020-05-09 ENCOUNTER — Other Ambulatory Visit: Payer: Self-pay

## 2020-05-09 ENCOUNTER — Ambulatory Visit (INDEPENDENT_AMBULATORY_CARE_PROVIDER_SITE_OTHER): Payer: 59

## 2020-05-09 DIAGNOSIS — Z23 Encounter for immunization: Secondary | ICD-10-CM | POA: Diagnosis not present

## 2020-05-09 NOTE — Progress Notes (Signed)
Patient presented for shingles injection to right deltoid, patient voiced no concerns nor showed any signs of distress during injection.

## 2020-11-07 ENCOUNTER — Telehealth: Payer: Self-pay | Admitting: *Deleted

## 2020-11-07 ENCOUNTER — Other Ambulatory Visit: Payer: Self-pay | Admitting: Oncology

## 2020-11-07 DIAGNOSIS — R911 Solitary pulmonary nodule: Secondary | ICD-10-CM

## 2020-11-07 NOTE — Progress Notes (Signed)
  Pulmonary Nodule Clinic Telephone Note [facility]    HPI: Patient has pmh significant for htn, rectal bleed, GERD, obesity, tobacco abuse who had imaging on 12/23/2019 for follow-up of an abdominal aneurysm and incidentally noted to have small nodules in left lung largest measuring around 4 mm.  Noncontrast chest CT should be considered if he is considered high risk.  He was referred to our clinic for follow-up.  Review and Recommendations: I personally reviewed all patient's previous imaging including CTA from 12/23/2019.  I recommend follow-up with noncontrast chest CT 1 year from previous.  Social History: Tobacco Use: High Risk   Smoking Tobacco Use: Never   Smokeless Tobacco Use: Current    High risk factors include: History of heavy smoking, exposure to asbestos, radium or uranium, personal family history of lung cancer, older age, sex (females greater than males), race (black and native [place] greater than weight), marginal speculation, upper lobe location, multiplicity (less than 5 nodules increases risk for malignancy) and emphysema and/or pulmonary fibrosis.   This recommendation follows the consensus statement: Guidelines for Management of Incidental Pulmonary Nodules Detected on CT Images: From the Fleischner Society 2017; Radiology 2017; 284:228-243.    I have placed order for CT scan without contrast to be completed 12 months from previous.  Disposition: Order placed for repeat CT chest. Will notify Micael Hampshire in scheduling. [address] to call patient with appointment date and time. Return to pulmonary nodule clinic a few days after his repeat imaging to discuss results and plan moving forward.  Durenda Hurt, NP 11/07/2020 12:27 PM

## 2020-11-07 NOTE — Telephone Encounter (Signed)
Pt made aware of referral to Lung Nodule Clinic from PCP. Pt is in agreement to have upcoming CT scan and follow up as recommended.  Pt has been made aware of upcoming appts for follow up CT scan and follow up appt with Jennifer Burns, NP in the Lung Nodule Clinic. Pt verbalized understanding. Nothing further needed at this time.  

## 2020-12-25 ENCOUNTER — Ambulatory Visit
Admission: RE | Admit: 2020-12-25 | Discharge: 2020-12-25 | Disposition: A | Discharge status: Home / Self Care | Payer: 59 | Source: Ambulatory Visit | Attending: Oncology | Admitting: Oncology

## 2020-12-25 ENCOUNTER — Other Ambulatory Visit: Payer: Self-pay

## 2020-12-25 DIAGNOSIS — R911 Solitary pulmonary nodule: Secondary | ICD-10-CM | POA: Diagnosis present

## 2021-01-01 ENCOUNTER — Inpatient Hospital Stay: Payer: 59 | Attending: Oncology | Admitting: Oncology

## 2021-01-01 ENCOUNTER — Other Ambulatory Visit: Payer: Self-pay

## 2021-01-01 DIAGNOSIS — Z818 Family history of other mental and behavioral disorders: Secondary | ICD-10-CM | POA: Insufficient documentation

## 2021-01-01 DIAGNOSIS — I10 Essential (primary) hypertension: Secondary | ICD-10-CM | POA: Diagnosis not present

## 2021-01-01 DIAGNOSIS — Z822 Family history of deafness and hearing loss: Secondary | ICD-10-CM | POA: Insufficient documentation

## 2021-01-01 DIAGNOSIS — K769 Liver disease, unspecified: Secondary | ICD-10-CM | POA: Diagnosis not present

## 2021-01-01 DIAGNOSIS — R911 Solitary pulmonary nodule: Secondary | ICD-10-CM | POA: Insufficient documentation

## 2021-01-01 DIAGNOSIS — K219 Gastro-esophageal reflux disease without esophagitis: Secondary | ICD-10-CM | POA: Insufficient documentation

## 2021-01-01 DIAGNOSIS — Z7289 Other problems related to lifestyle: Secondary | ICD-10-CM | POA: Diagnosis not present

## 2021-01-01 DIAGNOSIS — Z811 Family history of alcohol abuse and dependence: Secondary | ICD-10-CM | POA: Insufficient documentation

## 2021-01-01 DIAGNOSIS — E669 Obesity, unspecified: Secondary | ICD-10-CM | POA: Insufficient documentation

## 2021-01-01 DIAGNOSIS — Z8249 Family history of ischemic heart disease and other diseases of the circulatory system: Secondary | ICD-10-CM | POA: Insufficient documentation

## 2021-01-01 DIAGNOSIS — F17220 Nicotine dependence, chewing tobacco, uncomplicated: Secondary | ICD-10-CM | POA: Insufficient documentation

## 2021-01-01 DIAGNOSIS — Z836 Family history of other diseases of the respiratory system: Secondary | ICD-10-CM | POA: Insufficient documentation

## 2021-01-01 DIAGNOSIS — Z79899 Other long term (current) drug therapy: Secondary | ICD-10-CM | POA: Insufficient documentation

## 2021-01-01 DIAGNOSIS — Z809 Family history of malignant neoplasm, unspecified: Secondary | ICD-10-CM | POA: Diagnosis not present

## 2021-01-01 DIAGNOSIS — Z8261 Family history of arthritis: Secondary | ICD-10-CM | POA: Insufficient documentation

## 2021-01-01 NOTE — Progress Notes (Signed)
Pulmonary Nodule Clinic Consult note [facility]  [phone] [phone]  Patient Care Team: Theadore Nan, NP as PCP - General (Nurse Practitioner) Debbe Odea, MD as PCP - Cardiology (Cardiology)   Name of the patient: Craig Finley  [id number]  1961/02/20   Date of visit: 01/01/2021   Diagnosis- Lung Nodule  Chief complaint/ Reason for visit- Pulmonary Nodule Clinic Initial Visit  Past Medical History:  Patient has pmh significant for htn, rectal bleed, GERD, obesity, tobacco abuse who had imaging on 12/23/2019 for follow-up of an abdominal aneurysm and incidentally noted to have small nodules in left lung largest measuring around 4 mm.  Noncontrast chest CT should be considered if he is considered high risk.   He was referred to our clinic for follow-up.  Interval history-Craig Finley presents to clinic today to discuss recent CT scan.  He is married and lives with his wife.  He is currently doing well.  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  He did use appropriate face masks.  He is a non-smoker and has never smoked.  He does have family history of cancer; both his grandparents passed away of lung cancer greater than 30 years ago.  His aunt passed away from colon and his other aunt is in remission of breast cancer.  He has 2 uncles that died from pancreatic cancer.  He denies any current concerns at this time.  He feels well.  ECOG FS:0 - Asymptomatic  Review of systems- Review of Systems  Constitutional: Negative.  Negative for chills, fever, malaise/fatigue and weight loss.  HENT:  Negative for congestion, ear pain and tinnitus.   Eyes: Negative.  Negative for blurred vision and double vision.  Respiratory: Negative.  Negative for cough, sputum production and shortness of breath.   Cardiovascular: Negative.  Negative  for chest pain, palpitations and leg swelling.  Gastrointestinal: Negative.  Negative for abdominal pain, constipation, diarrhea, nausea and vomiting.  Genitourinary:  Negative for dysuria, frequency and urgency.  Musculoskeletal:  Negative for back pain and falls.  Skin: Negative.  Negative for rash.  Neurological: Negative.  Negative for weakness and headaches.  Endo/Heme/Allergies: Negative.  Does not bruise/bleed easily.  Psychiatric/Behavioral: Negative.  Negative for depression. The patient is not nervous/anxious and does not have insomnia.     No Known Allergies   Past Medical History:  Diagnosis Date   Allergy    Blood in stool    Heart murmur    as child   Hypertension      Past Surgical History:  Procedure Laterality Date   ANAL FISSURE REPAIR  05/1998   some type of anal fissure procedure   COLONOSCOPY WITH PROPOFOL N/A 01/31/2020   Procedure: COLONOSCOPY WITH PROPOFOL;  Surgeon: Toney Reil, MD;  Location: [facility] ENDOSCOPY;  Service: Gastroenterology;  Laterality: N/A;   ESOPHAGOGASTRODUODENOSCOPY (EGD) WITH PROPOFOL N/A 01/31/2020   Procedure: ESOPHAGOGASTRODUODENOSCOPY (EGD) WITH PROPOFOL;  Surgeon: Toney Reil, MD;  Location: [facility] ENDOSCOPY;  Service: Gastroenterology;  Laterality: N/A;    Social History   Socioeconomic History   Marital status: Married    Spouse name: Not on file   Number of children: Not on file   Years of education: Not on file   Highest education level: Some college, no degree  Occupational History   Occupation: trucker  Tobacco Use   Smoking status: Never  Smokeless tobacco: Current    Types: Snuff   Tobacco comments:    dipping x 25-30 years  Vaping Use   Vaping Use: Never used  Substance and Sexual Activity   Alcohol use: Yes    Comment: drinks 4-7 beers some days and none others. 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    Social Determinants of Health   Financial Resource Strain: Not on file  Food Insecurity: Not on file  Transportation Needs: Not on file  Physical Activity: Not on file  Stress: Not on file  Social Connections: Not on file  Intimate Partner Violence: Not on file    Family History  Problem Relation Age of Onset   Heart disease Maternal Uncle    Heart disease Maternal Uncle    Arthritis Mother    Asthma Mother    Hypertension Mother    Hearing loss Father    Alcohol abuse Sister    Cancer Sister    Depression Sister    Drug abuse Sister    Early death Sister    Heart attack Sister    Asthma Daughter    Asthma Son    Alcohol abuse Maternal Grandmother    Cancer Maternal Grandmother    Cancer Maternal Grandfather    Hypertension Sister    Miscarriages / Stillbirths Sister    Asthma Sister    Depression Sister      Current Outpatient Medications:    Acetaminophen (TYLENOL PO), Take by mouth as needed., Disp: , Rfl:    amLODipine (NORVASC) 5 MG tablet, Take 1 tablet (5 mg total) by mouth daily., Disp: 90 tablet, Rfl: 3   aspirin EC 81 MG tablet, Take 81 mg by mouth daily. Swallow whole., Disp: , Rfl:    Fexofenadine-Pseudoephedrine (ALLEGRA-D PO), Take 1 tablet by mouth daily as needed (allergies)., Disp: , Rfl:    omeprazole (PRILOSEC) 20 MG capsule, Take 1 capsule (20 mg total) by mouth daily., Disp: 30 capsule, Rfl: 3   triamcinolone (NASACORT) 55 MCG/ACT AERO nasal inhaler, Place 2 sprays into the nose daily., Disp: 1 each, Rfl: 4  Physical exam: There were no vitals filed for this visit. Physical Exam Constitutional:      Appearance: Normal appearance.  HENT:     Head: Normocephalic and atraumatic.  Eyes:     Pupils: Pupils are equal, round, and reactive to light.  Cardiovascular:     Rate and Rhythm: Normal rate and regular rhythm.     Heart sounds: Normal heart sounds. No murmur  heard. Pulmonary:     Effort: Pulmonary effort is normal.     Breath sounds: Normal breath sounds. No wheezing.  Abdominal:     General: Bowel sounds are normal. There is no distension.     Palpations: Abdomen is soft.     Tenderness: There is no abdominal tenderness.  Musculoskeletal:        General: Normal range of motion.     Cervical back: Normal range of motion.  Skin:    General: Skin is warm and dry.     Findings: No rash.  Neurological:     Mental Status: He is alert and oriented to person, place, and time.  Psychiatric:        Judgment: Judgment normal.  CMP Latest Ref Rng & Units 02/14/2020  Glucose 70 - 99 mg/dL 98  BUN 6 - 23 mg/dL 10  Creatinine 1.61 - 0.96 mg/dL 0.45  Sodium 409 - 811 mEq/L 140  Potassium 3.5 - 5.1 mEq/L 4.4  Chloride 96 - 112 mEq/L 104  CO2 19 - 32 mEq/L 30  Calcium 8.4 - 10.5 mg/dL 8.9  Total Protein 6.0 - 8.3 g/dL -  Total Bilirubin 0.2 - 1.2 mg/dL -  Alkaline Phos 39 - 914 U/L -  AST 0 - 37 U/L -  ALT 0 - 53 U/L -   CBC Latest Ref Rng & Units 11/18/2019  WBC 4.0 - 10.5 K/uL 5.2  Hemoglobin 13.0 - 17.0 g/dL 78.2  Hematocrit 95.6 - 52.0 % 45.4  Platelets 150.0 - 400.0 K/uL 207.0    No images are attached to the encounter.  CT Chest Wo Contrast  Result Date: 12/25/2020 CLINICAL DATA:  Pulmonary nodule follow-up in a 60 year old male, high-risk patient by report EXAM: CT CHEST WITHOUT CONTRAST TECHNIQUE: Multidetector CT imaging of the chest was performed following the standard protocol without IV contrast. COMPARISON:  Cardiac evaluation from September of 2021. FINDINGS: Cardiovascular: Normal caliber of the thoracic aorta. Scattered aortic atherosclerosis without aneurysmal dilation. Normal heart size without substantial pericardial effusion. Central pulmonary vasculature is normal caliber. Mediastinum/Nodes: Grossly unremarkable esophagus. No adenopathy in the chest. Lungs/Pleura: No effusion. No consolidative changes. Airways are  patent. RIGHT upper lobe pulmonary nodule (image 66/3) 5 mm, unchanged seen along the major fissure in the posterior RIGHT upper lobe. (Image 77/3) LEFT upper lobe pulmonary nodule also unchanged along the pleural surface of the LEFT upper lobe, 5 mm. Upper Abdomen: Low-density hepatic lesions likely cysts dominant cyst in the superior RIGHT hemi liver (image 115/2) water density another water density, well-circumscribed lesion in the peripheral RIGHT hemi liver (image 149/2). Subtle nodularity of the adrenal glands with benign appearance. The imaged portions of the pancreas and spleen with unremarkable appearance. Musculoskeletal: No acute or destructive bone finding. IMPRESSION: Small bilateral pulmonary nodules, unchanged since the previous exam. Compatible with benign findings. Low-density hepatic lesions likely cysts. Aortic Atherosclerosis (ICD10-I70.0). Electronically Signed   By: Donzetta Kohut M.D.   On: 12/25/2020 15:28     Assessment and plan- Patient is a 60 y.o. male who presents to pulmonary nodule clinic for follow-up of incidental lung nodules.     CT chest without contrast from 12/25/2020 shows small bilateral pulmonary nodules that are unchanged since previous exam.  This is compatible with benign findings.  Low-density hepatic lesion likely cyst.  Aortic arthrosclerosis.   Calculating malignancy probability of a pulmonary nodule: Risk factors include: 1.  Age. 2.  Cancer history. 3.  Diameter of pulmonary nodule and mm 4.  Location 5.  Smoking history 6.  Spiculation present   Based on risk factors, this patient is Low risk for the development of lung cancer.  No additional follow-up is needed.  He does not qualify for the low-dose CT screening program.   During our visit, we discussed pulmonary nodules are a common incidental finding and are often how lung cancer is discovered.  Lung cancer survival is directly related to the stage at diagnosis.  We discussed that nodules can vary  in presentation from solitary pulmonary nodules to masses, 2 groundglass opacities and multiple nodules.  Pulmonary nodules in the majority of cases are benign but the probability of these becoming malignant cannot be undermined.  Early identification of malignant nodules could lead  to early diagnosis and increased survival.   We discussed the probability of pulmonary nodules becoming malignant increase with age, pack years of tobacco use, size/characteristics of the nodule and location; with upper lobe involvement being most worrisome.   We discussed the goal of our clinic is to thoroughly evaluate each nodule, developed a comprehensive, individualized plan of care utilizing the most advanced technology and significantly reduce the time from detection to treatment.  A dedicated pulmonary nodule clinic has proven to indeed expedite the detection and treatment of lung cancer.   Patient education in fact sheet provided along with most recent CT scans.  Plan/assessment- He is a non-smoker and has never smoked.  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  Overall very low risk. No additional follow-up needed. He is very low risk for the development of lung cancer.  Patient in agreement.   Visit Diagnosis 1. Lung nodule     Patient expressed understanding and was in agreement with this plan. He also understands that He can call clinic at any time with any questions, concerns, or complaints.   I spent 20 minutes dedicated to the care of this patient (face-to-face and non-face-to-face) on the date of the encounter to include what is described in the assessment and plan.  Thank you for allowing me to participate in the care of this very pleasant patient.    Mauro Kaufmann, NP [facility] at [facility] Cell - [phone] Pager- [phone] 01/01/2021 9:56 AM

## 2021-01-09 ENCOUNTER — Ambulatory Visit
Admission: RE | Admit: 2021-01-09 | Discharge: 2021-01-09 | Disposition: A | Discharge status: Home / Self Care | Payer: 59 | Attending: Internal Medicine | Admitting: Internal Medicine

## 2021-01-09 ENCOUNTER — Ambulatory Visit (INDEPENDENT_AMBULATORY_CARE_PROVIDER_SITE_OTHER): Payer: 59 | Admitting: Internal Medicine

## 2021-01-09 ENCOUNTER — Ambulatory Visit
Admission: RE | Admit: 2021-01-09 | Discharge: 2021-01-09 | Disposition: A | Discharge status: Home / Self Care | Payer: 59 | Source: Ambulatory Visit | Attending: Internal Medicine | Admitting: Internal Medicine

## 2021-01-09 ENCOUNTER — Encounter: Payer: Self-pay | Admitting: Internal Medicine

## 2021-01-09 ENCOUNTER — Other Ambulatory Visit: Payer: Self-pay

## 2021-01-09 VITALS — BP 144/80 | HR 66 | Temp 98.5°F | Ht 67.13 in | Wt 197.2 lb

## 2021-01-09 DIAGNOSIS — M25511 Pain in right shoulder: Secondary | ICD-10-CM

## 2021-01-09 DIAGNOSIS — G8929 Other chronic pain: Secondary | ICD-10-CM

## 2021-01-09 DIAGNOSIS — I10 Essential (primary) hypertension: Secondary | ICD-10-CM | POA: Diagnosis not present

## 2021-01-09 DIAGNOSIS — Z23 Encounter for immunization: Secondary | ICD-10-CM

## 2021-01-09 MED ORDER — AMLODIPINE BESYLATE 2.5 MG PO TABS: 2.5000 mg | ORAL_TABLET | Freq: Every day | ORAL | 3 refills | Status: DC

## 2021-01-09 NOTE — Progress Notes (Signed)
BP (!) 144/80   Pulse 66   Temp 98.5 F (36.9 C) (Oral)   Ht 5' 7.13" (1.705 m)   Wt 197 lb 3.2 oz (89.4 kg)   SpO2 98%   BMI 30.77 kg/m    Subjective:    Patient ID: Craig Finley, male    DOB: 08-07-1960, 60 y.o.   MRN: [id number]  Chief Complaint  Patient presents with   New Patient (Initial Visit)    To est. Care.   Shoulder Pain    Has been having right shoulder pain since Aug. After playing soft ball    HPI: Mavric Cortright is a 60 y.o. male  Pt was seeing [place] primary care as this was on his way to work, his last pcp left.  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  Is supposed to be on norvasc but isnt.   Shoulder Pain  This is a new (aug was plaing senior softball was seen by urgent care and was given pills for such and didnt get xray. has pain now.) problem. Episode onset: denies stiffness hurts more at night per pt. tries not to sleep on it. Associated symptoms include a limited range of motion and stiffness. Pertinent negatives include no fever, inability to bear weight, itching, joint locking, joint swelling, numbness or tingling. He has tried NSAIDS for the symptoms.  Hypertension This is a chronic problem. The current episode started more than 1 year ago. The problem is unchanged (stopped taking it in may or june.). The problem is uncontrolled. Pertinent negatives include no anxiety, blurred vision, chest pain, headaches, neck pain, palpitations or shortness of breath.   Chief Complaint  Patient presents with   New Patient (Initial Visit)    To est. Care.   Shoulder Pain    Has been having right shoulder pain since Aug. After playing soft ball    Relevant past medical, surgical, family and social history reviewed and updated as indicated. Interim medical history since our last visit reviewed. Allergies and medications reviewed and updated.  Review of Systems   Constitutional:  Negative for activity change, appetite change, chills, fatigue and fever.  HENT:  Negative for congestion, ear discharge, ear pain and facial swelling.   Eyes:  Negative for blurred vision, pain, discharge and itching.  Respiratory:  Negative for cough, chest tightness, shortness of breath and wheezing.   Cardiovascular:  Negative for chest pain, palpitations and leg swelling.  Gastrointestinal:  Negative for abdominal distention, abdominal pain, blood in stool, constipation, diarrhea, nausea and vomiting.  Endocrine: Negative for cold intolerance, heat intolerance, polydipsia, polyphagia and polyuria.  Genitourinary:  Negative for difficulty urinating, dysuria, flank pain, frequency, hematuria and urgency.  Musculoskeletal:  Positive for stiffness. Negative for arthralgias, gait problem, joint swelling, myalgias and neck pain.  Skin:  Negative for color change, itching, rash and wound.  Neurological:  Negative for dizziness, tingling, tremors, speech difficulty, weakness, light-headedness, numbness and headaches.  Hematological:  Does not bruise/bleed easily.  Psychiatric/Behavioral:  Negative for agitation, confusion, decreased concentration, sleep disturbance and suicidal ideas.    Per HPI unless specifically indicated above     Objective:    BP (!) 144/80   Pulse 66   Temp 98.5 F (36.9 C) (Oral)   Ht 5' 7.13" (1.705 m)   Wt 197 lb 3.2 oz (89.4 kg)   SpO2 98%   BMI  30.77 kg/m   Wt Readings from Last 3 Encounters:  01/09/21 197 lb 3.2 oz (89.4 kg)  02/14/20 197 lb (89.4 kg)  01/31/20 195 lb (88.5 kg)    Physical Exam Vitals and nursing note reviewed.  Constitutional:      General: He is not in acute distress.    Appearance: Normal appearance. He is not ill-appearing or diaphoretic.  HENT:     Head: Normocephalic and atraumatic.     Right Ear: Tympanic membrane and external ear normal. There is no impacted cerumen.     Left Ear: External ear normal.      Nose: No congestion or rhinorrhea.     Mouth/Throat:     Pharynx: No oropharyngeal exudate or posterior oropharyngeal erythema.  Eyes:     Conjunctiva/sclera: Conjunctivae normal.     Pupils: Pupils are equal, round, and reactive to light.  Cardiovascular:     Rate and Rhythm: Normal rate and regular rhythm.     Heart sounds: No murmur heard.   No friction rub. No gallop.  Pulmonary:     Effort: No respiratory distress.     Breath sounds: No stridor. No wheezing or rhonchi.  Chest:     Chest wall: No tenderness.  Abdominal:     General: Abdomen is flat. Bowel sounds are normal. There is no distension.     Palpations: Abdomen is soft. There is no mass.     Tenderness: There is no abdominal tenderness. There is no guarding.  Musculoskeletal:        General: Tenderness and signs of injury present. No swelling or deformity.     Cervical back: Normal range of motion and neck supple. No rigidity or tenderness.     Comments: Decreased range of movement  Skin:    General: Skin is warm and dry.     Coloration: Skin is not jaundiced.     Findings: No erythema.  Neurological:     Mental Status: He is alert and oriented to person, place, and time. Mental status is at baseline.  Psychiatric:        Mood and Affect: Mood normal.        Behavior: Behavior normal.        Thought Content: Thought content normal.        Judgment: Judgment normal.    Results for orders placed or performed in visit on 02/14/20  Basic metabolic panel  Result Value Ref Range   Sodium 140 135 - 145 mEq/L   Potassium 4.4 3.5 - 5.1 mEq/L   Chloride 104 96 - 112 mEq/L   CO2 30 19 - 32 mEq/L   Glucose, Bld 98 70 - 99 mg/dL   BUN 10 6 - 23 mg/dL   Creatinine, Ser 0.25 0.40 - 1.50 mg/dL   GFR 42.70 >62.37 mL/min   Calcium 8.9 8.4 - 10.5 mg/dL  Microalbumin / creatinine urine ratio  Result Value Ref Range   Microalb, Ur <0.7 0.0 - 1.9 mg/dL   Creatinine,U 62.8 mg/dL   Microalb Creat Ratio 0.8 0.0 - 30.0 mg/g         Current Outpatient Medications:    Acetaminophen (TYLENOL PO), Take by mouth as needed., Disp: , Rfl:    aspirin EC 81 MG tablet, Take 81 mg by mouth daily. Swallow whole., Disp: , Rfl:    Fexofenadine-Pseudoephedrine (ALLEGRA-D PO), Take 1 tablet by mouth daily as needed (allergies)., Disp: , Rfl:    omeprazole (PRILOSEC) 20 MG capsule, Take 1  capsule (20 mg total) by mouth daily., Disp: 30 capsule, Rfl: 3   triamcinolone (NASACORT) 55 MCG/ACT AERO nasal inhaler, Place 2 sprays into the nose daily., Disp: 1 each, Rfl: 4   amLODipine (NORVASC) 5 MG tablet, Take 1 tablet (5 mg total) by mouth daily., Disp: 90 tablet, Rfl: 3    Assessment & Plan:  Hypertension continue Norvasc HTN :  Continue current meds.  Medication compliance emphasised. pt advised to keep Bp logs. Pt verbalised understanding of the same. Pt to have a low salt diet . Exercise to reach a goal of at least 150 mins a week.  lifestyle modifications explained and pt understands importance of the above.   GERD is on Prilosec 20 mg daily continue such consider changing to famotidine given the high risk of osteoporosis on chronic use of such.  Shoulder injury will check x-rays will refer patient to physical therapy consider orthopedic referral if x-rays show abnormalities   Problem List Items Addressed This Visit   None Visit Diagnoses     Need for influenza vaccination    -  Primary   Relevant Orders   Flu Vaccine QUAD 41mo+IM (Fluarix, Fluzone & Alfiuria Quad PF)        Orders Placed This Encounter  Procedures   Flu Vaccine QUAD 104mo+IM (Fluarix, Fluzone & Alfiuria Quad PF)     No orders of the defined types were placed in this encounter.    Follow up plan: No follow-ups on file.

## 2021-01-09 NOTE — Patient Instructions (Signed)

## 2021-01-10 NOTE — Progress Notes (Signed)
Please let pt know this was normal. Mild arthritis in shoulder joint.

## 2021-01-23 ENCOUNTER — Encounter: Payer: Self-pay | Admitting: Physical Therapy

## 2021-01-23 ENCOUNTER — Ambulatory Visit: Payer: 59 | Attending: Internal Medicine

## 2021-01-23 DIAGNOSIS — M25511 Pain in right shoulder: Secondary | ICD-10-CM | POA: Diagnosis present

## 2021-01-23 DIAGNOSIS — G8929 Other chronic pain: Secondary | ICD-10-CM | POA: Diagnosis present

## 2021-01-23 NOTE — Therapy (Signed)
[facility] [address] [place], [address] Phone: [phone]   Fax:  [phone]  Physical Therapy Evaluation  Patient Details  Name: Craig Finley MRN: [id number] Date of Birth: 1960/08/09 Referring Provider (PT): Vigg, Avanti  Encounter Date: 01/23/2021   PT End of Session - 01/23/21 1452     Visit Number 1    Number of Visits 17    Date for PT Re-Evaluation 03/20/21    Authorization Type [employer] Generic    Authorization Time Period 01/23/21-03/20/21    Authorization - Visit Number --    Progress Note Due on Visit 10    PT Start Time 0945    PT Stop Time 1032    PT Time Calculation (min) 47 min    Activity Tolerance Patient tolerated treatment well    Behavior During Therapy Craig Finley for tasks assessed/performed             Past Medical History:  Diagnosis Date   Allergy    Blood in stool    Heart murmur    as child   Hypertension     Past Surgical History:  Procedure Laterality Date   ANAL FISSURE REPAIR  05/1998   some type of anal fissure procedure   COLONOSCOPY WITH PROPOFOL N/A 01/31/2020   Procedure: COLONOSCOPY WITH PROPOFOL;  Surgeon: Toney Reil, MD;  Location: [facility] ENDOSCOPY;  Service: Gastroenterology;  Laterality: N/A;   ESOPHAGOGASTRODUODENOSCOPY (EGD) WITH PROPOFOL N/A 01/31/2020   Procedure: ESOPHAGOGASTRODUODENOSCOPY (EGD) WITH PROPOFOL;  Surgeon: Toney Reil, MD;  Location: [facility];  Service: Gastroenterology;  Laterality: N/A;    There were no vitals filed for this visit.    Subjective Assessment - 01/23/21 0948     Subjective Craig Finley is a 59 year old male with c/o R shoulder pain since November 01, 2020 following throwing a softball. Pt states that he plays in a senior travel league and has attempted playing once since incident but continues to be limited by pain. He reports his pain currently as 4/10 and 9/10 at worst. He has tried  icy/hot patches and tylenol as alleviators. Patients reports aggravating factors are lifting, carrying, driving, throwing, and sleeping. 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  Pt denies any unexplained weight fluctuation, saddle paresthesia, loss of bowel/bladder function, or unrelenting night pain at this time. Pt has a PMH of HTN, angina, and is currently being followed up for lung nodules found on chest CT.    Pertinent History Craig Finley is a 60 year old male with c/o R shoulder pain since November 01, 2020 following throwing a softball. Pt states that he plays in a senior travel league and has attempted playing once since incident but continues to be limited by pain. He reports his pain currently as 4/10 and 9/10 at worst. He has tried icy/hot patches and tylenol as alleviators. Patients reports aggravating factors are lifting, carrying, driving, throwing, and sleeping. 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  Pt denies any unexplained weight fluctuation, saddle paresthesia, loss of bowel/bladder function, or unrelenting night pain at this time. Pt has a PMH of HTN, angina, and is currently being followed up for lung nodules found on chest CT.  Limitations Lifting;House hold activities    How long can you sit comfortably? na    How long can you stand comfortably? na    How long can you walk comfortably? na    Diagnostic tests xray, unremarkable aside from age-typical arthrosis    Patient Stated Goals Pt would like to reduce R shoulder pain and play travel softball    Currently in Pain? Yes    Pain Score 4     Pain Location Shoulder    Pain Orientation Right    Pain Descriptors / Indicators Burning;Sharp    Pain Type Chronic pain    Pain Radiating Towards RUE    Pain Onset  More than a month ago    Pain Frequency Constant    Aggravating Factors  throwing, driving, lifting, catching    Pain Relieving Factors icy/hot patch, tylenol, ibuprofen    Effect of Pain on Daily Activities limits occupation and recreational actiity                  [facility] PT Assessment - 01/23/21 0001       Assessment   Medical Diagnosis Chronic R Shoulder Pain    Referring Provider (PT) Vigg, Avanti    Hand Dominance Right    Prior Therapy none this episode      Precautions   Precautions None      Restrictions   Weight Bearing Restrictions No      Prior Function   Level of Independence Independent      Observation/Other Assessments   Focus on Therapeutic Outcomes (FOTO)  55             Cervical ROM Screening WNL  Elbow AROM WFL  Elbow MMT Flex 5/5 bilat Ext 5/5 bilat  UE Sensation: appears intact bilaterally   Posture: Forward head, rounder shoulders, increased thoracic kyphosis  Scapulohumeral Rhythm: impaired evidenced by shoulder hiking with  lifting UE in overhead motion.  Shoulder AROM L/R  Flexion:140/165 deg  Abduction: 110/150deg  IR: deg t10 bilaterally   ER: deg C6 bilaterally  Shoulder PROM   Patient demonstrated full PROM with pain near end range flexion and abduction of R shoulder.  Shoulder MMT L/R  Flexion: 4-/5; within available range 5/5  Abduction:4-/5; within available range 5/5  IR:4-/5; 5/5  ER: 4-/5; 5/5  Palpation: No tenderness upon palpation to periscapular musculature.  Shoulder Special Tests:  Hawkin's Kennedy (-)  Neer's +  Painful Arc (+)  ER LAG (-)  Empty Can (+)  Full Can (+)  Resisted horizontal abduction: (+)      Objective measurements completed on examination: See above findings.    Ther-Ex PT reviewed the following HEP with patient with patient able to demonstrate a set of the following with min cuing for correction needed. PT educated patient on parameters of therex  (how/when to inc/decrease intensity, frequency, rep/set range, stretch hold time, and purpose of therex) with verbalized understanding.    Standing ER with Theraband 3 x 8-10 reps within pain free range   AAROM Flexion with dowel 15-20 reps  AAROM ABD with dowel 15-20 reps       PT Education - 01/23/21 1452     Education Details Patient was educated on diagnosis, anatomy and pathology involved, prognosis, role of PT, and was given an HEP, demonstrating exercise with proper form following verbal and tactile cues, and was given a paper hand out to continue exercise at home. Pt was educated on and agreed to plan of  care.    Person(s) Educated Patient    Methods Explanation;Demonstration    Comprehension Verbalized understanding;Returned demonstration              PT Short Term Goals - 01/23/21 1514       PT SHORT TERM GOAL #1   Title Pt will demonstrate independence with HEP to improve R shoulder function for increased ability to participate with ADLs    Baseline HEP given    Time 4    Period Weeks    Status New    Target Date 02/20/21               PT Long Term Goals - 01/23/21 1517       PT LONG TERM GOAL #1   Title Patient will increase FOTO score to 72 to indicate meaningful increase in functional mobility to complete ADLs    Baseline 01/23/21: 55    Time 8    Period Weeks    Status New    Target Date 03/20/21      PT LONG TERM GOAL #2   Title Patient will improve R shoulder flex/abd AROM to 150 degrees in sitting without pain greater than 2/10 on NPRS.    Baseline 01/23/21: Flex 140, ABD 110;    Time 8    Period Weeks    Status New    Target Date 03/20/21      PT LONG TERM GOAL #3   Title Pt will decrease worst pain as reported on NPRS by at least 3 points in order to demonstrate clinically significant reduction in pain.    Baseline 01/23/21: 9/10    Time 8    Period Weeks    Status New    Target Date 03/20/21      PT LONG TERM GOAL #4   Title  Patient will improve all R shoulder strength to 4+/5 for flexion, abd, and external/internal rotation in order to improve ability to perform heavy household chores, perform occupation duties, and play recreational softball.    Baseline 01/23/21:  Flexion: 4-/5   Abduction:4-/5    IR:4-/5    ER: 4-/5    Time 8    Period Weeks    Status New    Target Date 03/20/21                    Plan - 01/23/21 1454     Clinical Impression Statement Patient is a 60 y.o. male to presenting to PT with sudden pain onset after throwing a softball. Exam revealing of impairments of R shoulder strength, AROM, and motor control. Impairments have resulted in decreased activity tolerance to lifting, carrying, and throwing, which has prevented full participation in sleeping, sports, and work-duties (driving). Pt will benefit from skilled PT to address these impairments and achieve set goals to promote to PLOF in ADL, IADL, work, and sport.    Personal Factors and Comorbidities Time since onset of injury/illness/exacerbation    Examination-Activity Limitations Sleep;Carry;Other    Examination-Participation Restrictions Driving;Occupation;Yard Work;Cleaning;Community Activity    Stability/Clinical Decision Making Stable/Uncomplicated    Clinical Decision Making Low    Rehab Potential Good    PT Frequency 2x / week    PT Duration 8 weeks    PT Treatment/Interventions ADLs/Self Care Home Management;Cryotherapy;Electrical Stimulation;Iontophoresis 4mg /ml Dexamethasone;Moist Heat;Traction;Ultrasound;Functional mobility training;Therapeutic activities;Neuromuscular re-education;Therapeutic exercise;Patient/family education;Manual techniques;Passive range of motion;Dry needling    PT Next Visit Plan Review HEP/ROM/strengthening    PT Home Exercise Plan ER, AAROM  Consulted and Agree with Plan of Care Patient             Patient will benefit from skilled therapeutic intervention in order to improve the  following deficits and impairments:  Decreased endurance, Decreased mobility, Pain, Postural dysfunction, Decreased strength, Decreased activity tolerance, Decreased range of motion, Hypomobility  Visit Diagnosis: Chronic right shoulder pain - Plan: PT plan of care cert/re-cert     Problem List Patient Active Problem List   Diagnosis Date Noted   Non-seasonal allergic rhinitis 02/14/2020   Chest pain, non-cardiac 01/07/2020   Pulmonary nodules 01/07/2020   Essential hypertension 12/09/2019   Elevated serum creatinine 11/27/2019   Elevated BP without diagnosis of hypertension 11/27/2019   Breast pain, left 11/27/2019   Rectal bleeding 11/04/2019   Gastroesophageal reflux disease 11/04/2019   Chest pain 11/04/2019   SOB (shortness of breath) 11/04/2019   BMI 30.0-30.9,adult 11/04/2019   Encounter for HCV screening test for low risk patient 11/04/2019   Screening for HIV (human immunodeficiency virus) 11/04/2019   Encounter for screening and preventative care 11/04/2019   Tobacco chew use 11/04/2019   Craig Finley, SPT  Craig Finley, Student-PT 01/23/2021, 4:33 PM  [facility] [address] [place], [address] Phone: [phone]   Fax:  [phone]  Name: Craig Finley MRN: [id number] Date of Birth: 26-Oct-1960

## 2021-01-25 ENCOUNTER — Other Ambulatory Visit: Payer: 59

## 2021-01-25 ENCOUNTER — Encounter: Payer: 59 | Admitting: Physical Therapy

## 2021-01-25 ENCOUNTER — Other Ambulatory Visit: Payer: Self-pay

## 2021-01-25 DIAGNOSIS — I10 Essential (primary) hypertension: Secondary | ICD-10-CM

## 2021-01-25 DIAGNOSIS — G8929 Other chronic pain: Secondary | ICD-10-CM

## 2021-01-25 LAB — URINALYSIS, ROUTINE W REFLEX MICROSCOPIC
Bilirubin, UA: NEGATIVE
Glucose, UA: NEGATIVE
Ketones, UA: NEGATIVE
Leukocytes,UA: NEGATIVE
Nitrite, UA: NEGATIVE
Protein,UA: NEGATIVE
Specific Gravity, UA: 1.02 (ref 1.005–1.030)
Urobilinogen, Ur: 0.2 mg/dL (ref 0.2–1.0)
pH, UA: 5.5 (ref 5.0–7.5)

## 2021-01-25 LAB — MICROSCOPIC EXAMINATION
Bacteria, UA: NONE SEEN
Epithelial Cells (non renal): NONE SEEN /hpf (ref 0–10)
WBC, UA: NONE SEEN /hpf (ref 0–5)

## 2021-01-26 LAB — CBC WITH DIFFERENTIAL/PLATELET
Basophils Absolute: 0 10*3/uL (ref 0.0–0.2)
Basos: 1 %
EOS (ABSOLUTE): 0.4 10*3/uL (ref 0.0–0.4)
Eos: 6 %
Hematocrit: 47.3 % (ref 37.5–51.0)
Hemoglobin: 15.8 g/dL (ref 13.0–17.7)
Immature Grans (Abs): 0 10*3/uL (ref 0.0–0.1)
Immature Granulocytes: 0 %
Lymphocytes Absolute: 1.9 10*3/uL (ref 0.7–3.1)
Lymphs: 31 %
MCH: 29.7 pg (ref 26.6–33.0)
MCHC: 33.4 g/dL (ref 31.5–35.7)
MCV: 89 fL (ref 79–97)
Monocytes Absolute: 0.3 10*3/uL (ref 0.1–0.9)
Monocytes: 5 %
Neutrophils Absolute: 3.4 10*3/uL (ref 1.4–7.0)
Neutrophils: 57 %
Platelets: 208 10*3/uL (ref 150–450)
RBC: 5.32 x10E6/uL (ref 4.14–5.80)
RDW: 12.7 % (ref 11.6–15.4)
WBC: 6 10*3/uL (ref 3.4–10.8)

## 2021-01-26 LAB — PSA: Prostate Specific Ag, Serum: 1.3 ng/mL (ref 0.0–4.0)

## 2021-01-26 LAB — COMPREHENSIVE METABOLIC PANEL
ALT: 22 IU/L (ref 0–44)
AST: 18 IU/L (ref 0–40)
Albumin/Globulin Ratio: 1.8 (ref 1.2–2.2)
Albumin: 4.2 g/dL (ref 3.8–4.9)
Alkaline Phosphatase: 56 IU/L (ref 44–121)
BUN/Creatinine Ratio: 8 — ABNORMAL LOW (ref 9–20)
BUN: 10 mg/dL (ref 6–24)
Bilirubin Total: 0.4 mg/dL (ref 0.0–1.2)
CO2: 27 mmol/L (ref 20–29)
Calcium: 9.1 mg/dL (ref 8.7–10.2)
Chloride: 104 mmol/L (ref 96–106)
Creatinine, Ser: 1.28 mg/dL — ABNORMAL HIGH (ref 0.76–1.27)
Globulin, Total: 2.4 g/dL (ref 1.5–4.5)
Glucose: 95 mg/dL (ref 70–99)
Potassium: 3.9 mmol/L (ref 3.5–5.2)
Sodium: 144 mmol/L (ref 134–144)
Total Protein: 6.6 g/dL (ref 6.0–8.5)
eGFR: 64 mL/min/{1.73_m2} (ref >59)

## 2021-01-26 LAB — LIPID PANEL
Chol/HDL Ratio: 3.8 ratio (ref 0.0–5.0)
Cholesterol, Total: 193 mg/dL (ref 100–199)
HDL: 51 mg/dL (ref >39)
LDL Chol Calc (NIH): 119 mg/dL — ABNORMAL HIGH (ref 0–99)
Triglycerides: 130 mg/dL (ref 0–149)
VLDL Cholesterol Cal: 23 mg/dL (ref 5–40)

## 2021-01-26 LAB — TSH: TSH: 2.53 u[IU]/mL (ref 0.450–4.500)

## 2021-01-30 ENCOUNTER — Ambulatory Visit: Payer: 59 | Admitting: Internal Medicine

## 2021-01-30 ENCOUNTER — Encounter: Payer: Self-pay | Admitting: Internal Medicine

## 2021-01-30 ENCOUNTER — Other Ambulatory Visit: Payer: Self-pay

## 2021-01-30 ENCOUNTER — Ambulatory Visit (INDEPENDENT_AMBULATORY_CARE_PROVIDER_SITE_OTHER): Payer: 59 | Admitting: Internal Medicine

## 2021-01-30 ENCOUNTER — Ambulatory Visit: Payer: 59 | Admitting: Physical Therapy

## 2021-01-30 VITALS — BP 130/74 | HR 61 | Temp 99.0°F | Ht 67.13 in | Wt 200.4 lb

## 2021-01-30 DIAGNOSIS — M25511 Pain in right shoulder: Secondary | ICD-10-CM | POA: Insufficient documentation

## 2021-01-30 DIAGNOSIS — K219 Gastro-esophageal reflux disease without esophagitis: Secondary | ICD-10-CM | POA: Diagnosis not present

## 2021-01-30 DIAGNOSIS — I10 Essential (primary) hypertension: Secondary | ICD-10-CM | POA: Diagnosis not present

## 2021-01-30 MED ORDER — OMEPRAZOLE 20 MG PO CPDR: 20.0000 mg | DELAYED_RELEASE_CAPSULE | Freq: Every day | ORAL | 3 refills | Status: DC

## 2021-01-30 MED ORDER — MELOXICAM 15 MG PO TABS: 15.0000 mg | ORAL_TABLET | Freq: Every day | ORAL | 0 refills | Status: DC

## 2021-01-30 NOTE — Progress Notes (Signed)
BP 130/74   Pulse 61   Temp 99 F (37.2 C) (Oral)   Ht 5' 7.13" (1.705 m)   Wt 200 lb 6.4 oz (90.9 kg)   BMI 31.27 kg/m    Subjective:    Patient ID: Craig Finley, male    DOB: 1960-07-04, 60 y.o.   MRN: [id number]  Chief Complaint  Patient presents with   Hypertension   Shoulder Pain    Right shoulder still painful.  Is currently in Physical Therapy    HPI: Craig Finley is a 60 y.o. male  Pt is here for a follow up  Hypertension This is a chronic problem. The current episode started more than 1 month ago. The problem is controlled. Pertinent negatives include no anxiety, blurred vision, chest pain, headaches, malaise/fatigue, neck pain, orthopnea, palpitations, peripheral edema, PND, shortness of breath or sweats.  Shoulder Pain  This is a chronic (Thursday will be second time, says too soon to see a difference per his verbal record.) problem. The problem has been unchanged. The pain is at a severity of 4/10. The pain is mild. Associated symptoms include an inability to bear weight, joint locking and a limited range of motion. Pertinent negatives include no fever, itching, joint swelling or numbness.  Gastroesophageal Reflux He reports no abdominal pain, no chest pain, no coughing, no nausea or no wheezing. Pertinent negatives include no fatigue.   Chief Complaint  Patient presents with   Hypertension   Shoulder Pain    Right shoulder still painful.  Is currently in Physical Therapy    Relevant past medical, surgical, family and social history reviewed and updated as indicated. Interim medical history since our last visit reviewed. Allergies and medications reviewed and updated.  Review of Systems  Constitutional:  Negative for activity change, appetite change, chills, fatigue, fever and malaise/fatigue.  HENT:  Negative for congestion, ear discharge, ear pain and facial swelling.   Eyes:  Negative for blurred vision, pain, discharge and  itching.  Respiratory:  Negative for cough, chest tightness, shortness of breath and wheezing.   Cardiovascular:  Negative for chest pain, palpitations, orthopnea, leg swelling and PND.  Gastrointestinal:  Negative for abdominal distention, abdominal pain, blood in stool, constipation, diarrhea, nausea and vomiting.  Endocrine: Negative for cold intolerance, heat intolerance, polydipsia, polyphagia and polyuria.  Genitourinary:  Negative for difficulty urinating, dysuria, flank pain, frequency, hematuria and urgency.  Musculoskeletal:  Positive for arthralgias and myalgias. Negative for gait problem, joint swelling and neck pain.  Skin:  Negative for color change, itching, rash and wound.  Neurological:  Negative for dizziness, tremors, speech difficulty, weakness, light-headedness, numbness and headaches.  Hematological:  Does not bruise/bleed easily.  Psychiatric/Behavioral:  Negative for agitation, confusion, decreased concentration, sleep disturbance and suicidal ideas.    Per HPI unless specifically indicated above     Objective:    BP 130/74   Pulse 61   Temp 99 F (37.2 C) (Oral)   Ht 5' 7.13" (1.705 m)   Wt 200 lb 6.4 oz (90.9 kg)   BMI 31.27 kg/m   Wt Readings from Last 3 Encounters:  01/30/21 200 lb 6.4 oz (90.9 kg)  01/09/21 197 lb 3.2 oz (89.4 kg)  02/14/20 197 lb (89.4 kg)    Physical Exam Vitals and nursing note reviewed.  Constitutional:      General: He is not in acute distress.    Appearance: Normal appearance. He is not ill-appearing or diaphoretic.  HENT:  Head: Normocephalic and atraumatic.     Right Ear: Tympanic membrane and external ear normal. There is no impacted cerumen.     Left Ear: External ear normal.     Nose: No congestion or rhinorrhea.     Mouth/Throat:     Pharynx: No oropharyngeal exudate or posterior oropharyngeal erythema.  Eyes:     Conjunctiva/sclera: Conjunctivae normal.     Pupils: Pupils are equal, round, and reactive to light.   Cardiovascular:     Rate and Rhythm: Normal rate and regular rhythm.     Heart sounds: No murmur heard.   No friction rub. No gallop.  Pulmonary:     Effort: No respiratory distress.     Breath sounds: No stridor. No wheezing or rhonchi.  Chest:     Chest wall: No tenderness.  Abdominal:     General: Abdomen is flat. Bowel sounds are normal.     Palpations: Abdomen is soft. There is no mass.     Tenderness: There is no abdominal tenderness.  Musculoskeletal:        General: Tenderness present. No swelling, deformity or signs of injury.     Cervical back: Normal range of motion and neck supple. No rigidity or tenderness.     Right lower leg: No edema.     Left lower leg: No edema.  Skin:    General: Skin is warm and dry.  Neurological:     General: No focal deficit present.     Mental Status: He is alert and oriented to person, place, and time.     Cranial Nerves: No cranial nerve deficit.     Motor: No weakness.     Coordination: Coordination normal.     Gait: Gait normal.     Deep Tendon Reflexes: Reflexes normal.  Psychiatric:        Mood and Affect: Mood normal.        Behavior: Behavior normal.        Thought Content: Thought content normal.        Judgment: Judgment normal.   Results for orders placed or performed in visit on 01/25/21  Microscopic Examination   Urine  Result Value Ref Range   WBC, UA None seen 0 - 5 /hpf   RBC 0-2 0 - 2 /hpf   Epithelial Cells (non renal) None seen 0 - 10 /hpf   Bacteria, UA None seen None seen/Few  Urinalysis, Routine w reflex microscopic  Result Value Ref Range   Specific Gravity, UA 1.020 1.005 - 1.030   pH, UA 5.5 5.0 - 7.5   Color, UA Yellow Yellow   Appearance Ur Clear Clear   Leukocytes,UA Negative Negative   Protein,UA Negative Negative/Trace   Glucose, UA Negative Negative   Ketones, UA Negative Negative   RBC, UA Trace (A) Negative   Bilirubin, UA Negative Negative   Urobilinogen, Ur 0.2 0.2 - 1.0 mg/dL    Nitrite, UA Negative Negative   Microscopic Examination See below:   Comprehensive metabolic panel  Result Value Ref Range   Glucose 95 70 - 99 mg/dL   BUN 10 6 - 24 mg/dL   Creatinine, Ser 1.28 (H) 0.76 - 1.27 mg/dL   eGFR 64 >59 mL/min/1.73   BUN/Creatinine Ratio 8 (L) 9 - 20   Sodium 144 134 - 144 mmol/L   Potassium 3.9 3.5 - 5.2 mmol/L   Chloride 104 96 - 106 mmol/L   CO2 27 20 - 29 mmol/L   Calcium 9.1  8.7 - 10.2 mg/dL   Total Protein 6.6 6.0 - 8.5 g/dL   Albumin 4.2 3.8 - 4.9 g/dL   Globulin, Total 2.4 1.5 - 4.5 g/dL   Albumin/Globulin Ratio 1.8 1.2 - 2.2   Bilirubin Total 0.4 0.0 - 1.2 mg/dL   Alkaline Phosphatase 56 44 - 121 IU/L   AST 18 0 - 40 IU/L   ALT 22 0 - 44 IU/L  CBC with Differential/Platelet  Result Value Ref Range   WBC 6.0 3.4 - 10.8 x10E3/uL   RBC 5.32 4.14 - 5.80 x10E6/uL   Hemoglobin 15.8 13.0 - 17.7 g/dL   Hematocrit 47.3 37.5 - 51.0 %   MCV 89 79 - 97 fL   MCH 29.7 26.6 - 33.0 pg   MCHC 33.4 31.5 - 35.7 g/dL   RDW 12.7 11.6 - 15.4 %   Platelets 208 150 - 450 x10E3/uL   Neutrophils 57 Not Estab. %   Lymphs 31 Not Estab. %   Monocytes 5 Not Estab. %   Eos 6 Not Estab. %   Basos 1 Not Estab. %   Neutrophils Absolute 3.4 1.4 - 7.0 x10E3/uL   Lymphocytes Absolute 1.9 0.7 - 3.1 x10E3/uL   Monocytes Absolute 0.3 0.1 - 0.9 x10E3/uL   EOS (ABSOLUTE) 0.4 0.0 - 0.4 x10E3/uL   Basophils Absolute 0.0 0.0 - 0.2 x10E3/uL   Immature Granulocytes 0 Not Estab. %   Immature Grans (Abs) 0.0 0.0 - 0.1 x10E3/uL  Lipid panel  Result Value Ref Range   Cholesterol, Total 193 100 - 199 mg/dL   Triglycerides 130 0 - 149 mg/dL   HDL 51 >39 mg/dL   VLDL Cholesterol Cal 23 5 - 40 mg/dL   LDL Chol Calc (NIH) 119 (H) 0 - 99 mg/dL   Chol/HDL Ratio 3.8 0.0 - 5.0 ratio  PSA  Result Value Ref Range   Prostate Specific Ag, Serum 1.3 0.0 - 4.0 ng/mL  TSH  Result Value Ref Range   TSH 2.530 0.450 - 4.500 uIU/mL        Current Outpatient Medications:    Acetaminophen  (TYLENOL PO), Take by mouth as needed., Disp: , Rfl:    amLODipine (NORVASC) 2.5 MG tablet, Take 1 tablet (2.5 mg total) by mouth daily., Disp: 30 tablet, Rfl: 3   aspirin EC 81 MG tablet, Take 81 mg by mouth daily. Swallow whole., Disp: , Rfl:    Fexofenadine-Pseudoephedrine (ALLEGRA-D PO), Take 1 tablet by mouth daily as needed (allergies)., Disp: , Rfl:    omeprazole (PRILOSEC) 20 MG capsule, Take 1 capsule (20 mg total) by mouth daily., Disp: 30 capsule, Rfl: 3   triamcinolone (NASACORT) 55 MCG/ACT AERO nasal inhaler, Place 2 sprays into the nose daily., Disp: 1 each, Rfl: 4    Assessment & Plan:  Elevated Creat :  Incresease water intake to 64 oz   GERD : is on prilosec for such  patient advised to avoid laying down soon after his meals. He took a 2 hours between dinner and bedtime. Avoid spicy food and triggers that he knows food wise that worsen his acid reflux. Patient verbalized understanding of the above. Lifestyle modifications as above discussed with patient.  HTN is on amlodipine 2.5 mg  Continue current meds.  Medication compliance emphasised. pt advised to keep Bp logs. Pt verbalised understanding of the same. Pt to have a low salt diet . Exercise to reach a goal of at least 150 mins a week.  lifestyle modifications explained and pt understands  importance of the above.   BMI high at 31.27  Lifestyle modifications advised to pt.   Portion control and avoiding high carb low fat diet advised.  Diet plan given to pt   exercise plan given and encouraged.  To increase exercise to 150 mins a week ie 21/2 hours a week. Pt verbalises understanding of the above.   Shoulder pain chronic , stable, persistent  - is getting PT  Continue such  Will refer to  ortho    Incidental small pulm nodules noted on 9/21 Sees [facility] ( referred to such by last pcp )  CT chest without contrast from 12/25/2020 shows small bilateral pulmonary nodules that are unchanged since previous  exam.  This is compatible with benign findings.  Low-density hepatic lesion likely cyst.  Aortic arthrosclerosis.  Problem List Items Addressed This Visit   None    No orders of the defined types were placed in this encounter.    No orders of the defined types were placed in this encounter.    Follow up plan: No follow-ups on file.

## 2021-02-01 ENCOUNTER — Encounter: Payer: Self-pay | Admitting: Physical Therapy

## 2021-02-01 ENCOUNTER — Ambulatory Visit: Payer: 59 | Admitting: Physical Therapy

## 2021-02-01 DIAGNOSIS — G8929 Other chronic pain: Secondary | ICD-10-CM

## 2021-02-01 DIAGNOSIS — M25511 Pain in right shoulder: Secondary | ICD-10-CM

## 2021-02-01 NOTE — Therapy (Signed)
[facility] [facility] [address] [place], [address] Phone: [phone]   Fax:  [phone]  Physical Therapy Treatment  Patient Details  Name: Craig [facility] MRN: [id number] Date of Birth: 1960/09/23 Referring Provider (PT): Vigg, Avanti   Encounter Date: 02/01/2021   PT End of Session - 02/01/21 0954     Visit Number 2    Number of Visits 17    Date for PT Re-Evaluation 03/20/21    Authorization Type [employer] Generic    Authorization Time Period 01/23/21-03/20/21    Progress Note Due on Visit 10    PT Start Time 0950    PT Stop Time 1030    PT Time Calculation (min) 40 min    Activity Tolerance Patient tolerated treatment well    Behavior During Therapy [facility] for tasks assessed/performed             Past Medical History:  Diagnosis Date   Allergy    Blood in stool    Heart murmur    as child   Hypertension     Past Surgical History:  Procedure Laterality Date   ANAL FISSURE REPAIR  05/1998   some type of anal fissure procedure   COLONOSCOPY WITH PROPOFOL N/A 01/31/2020   Procedure: COLONOSCOPY WITH PROPOFOL;  Surgeon: Toney Reil, MD;  Location: [facility] ENDOSCOPY;  Service: Gastroenterology;  Laterality: N/A;   ESOPHAGOGASTRODUODENOSCOPY (EGD) WITH PROPOFOL N/A 01/31/2020   Procedure: ESOPHAGOGASTRODUODENOSCOPY (EGD) WITH PROPOFOL;  Surgeon: Toney Reil, MD;  Location: [facility] ENDOSCOPY;  Service: Gastroenterology;  Laterality: N/A;    There were no vitals filed for this visit.   Subjective Assessment - 02/01/21 0958     Subjective Pt reports R shoulder pain is no better. He feels that he may have over done it. He reports his pain is a 4/10 today.    Pertinent History Craig Finley is a 60 year old male with c/o R shoulder pain since November 01, 2020 following throwing a softball. Pt states that he plays in a senior travel league and has attempted playing once since incident but  continues to be limited by pain. He reports his pain currently as 4/10 and 9/10 at worst. He has tried icy/hot patches and tylenol as alleviators. Patients reports aggravating factors are lifting, carrying, driving, throwing, and sleeping. 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  Pt denies any unexplained weight fluctuation, saddle paresthesia, loss of bowel/bladder function, or unrelenting night pain at this time. Pt has a PMH of HTN, angina, and is currently being followed up for lung nodules found on chest CT.    Limitations Lifting;House hold activities    How long can you sit comfortably? na    How long can you stand comfortably? na    How long can you walk comfortably? na    Diagnostic tests xray, unremarkable aside from age-typical arthrosis    Patient Stated Goals Pt would like to reduce R shoulder pain and play travel softball             Therex  Pulleys Flex x 15 reps Pulleys ABD x 15 reps   Standing Theraband Row GTB 3 x 10 reps with cues for scapular retraction  R Shoulder External Rotation GTB 3 x 10 reps with cues to maintain elbow at side  Doorway stretch 3 x 30 sec  with demonstration to achieve desired positioning   Manual Therapy   G2 lateral, inferior, and posterior R GH joint mobilization 3 x 30 sec each  R shoulder distraction 3 x 30 sec  *pt positioned in supine with reports of decreased pain following intervention          PT Education - 02/01/21 1324     Education Details therex form/technique    Person(s) Educated Patient    Methods Explanation;Demonstration    Comprehension Verbalized understanding;Returned demonstration              PT Short Term Goals - 01/23/21 1514       PT SHORT TERM GOAL #1   Title Pt will demonstrate independence with HEP to improve R shoulder function for increased ability to participate with ADLs     Baseline HEP given    Time 4    Period Weeks    Status New    Target Date 02/20/21               PT Long Term Goals - 01/23/21 1517       PT LONG TERM GOAL #1   Title Patient will increase FOTO score to 72 to indicate meaningful improvment in self-reported function in ADL/IADL.    Baseline 01/23/21: 55    Time 8    Period Weeks    Status New    Target Date 03/20/21      PT LONG TERM GOAL #2   Title Patient will improve R shoulder flex/abd AROM to >150 degrees in sitting without pain greater than 2/10 on NPRS.    Baseline 01/23/21: Flex 140, ABD 110;    Time 8    Period Weeks    Status New    Target Date 03/20/21      PT LONG TERM GOAL #3   Title Pt will demonstrate accurate performance in advanced HEP for DC for long-term shoulder maintenance/strengthening to reduce long-term injury risk in sport.    Baseline 01/23/21: learning his new beginner HEP    Time 8    Period Weeks    Status New    Target Date 03/20/21      PT LONG TERM GOAL #4   Title Patient will improve all R shoulder strength to >or= to CL UE in flexion, abdct, and external/internal rotation to allow return to sport and heavy household duties, and job duties.    Baseline 01/23/21:  Flexion: 4-/5   Abduction:4-/5    IR:4-/5    ER: 4-/5    Time 8    Period Weeks    Status New    Target Date 03/20/21                   Plan - 02/01/21 1327     Clinical Impression Statement Pt tolerated session well with decreased R shoulder pain following manual therapy and exercise. Pt initiated R shoulder AAROM and strengthening this session. Pt reviewed HEP and educated patient to exercise within pain tolerance. Pt will continue to benefit from skilled PT to address impairments, ensure proper form, and achieve set goals.    Personal Factors and Comorbidities Time since onset of injury/illness/exacerbation    Examination-Activity Limitations Sleep;Carry;Other    Examination-Participation Restrictions  Driving;Occupation;Yard Work;Cleaning;Community Activity    Stability/Clinical Decision Making Stable/Uncomplicated    Clinical Decision Making Low    Rehab Potential Good    PT Frequency 2x / week    PT Duration 8 weeks  PT Treatment/Interventions ADLs/Self Care Home Management;Cryotherapy;Electrical Stimulation;Iontophoresis 4mg /ml Dexamethasone;Moist Heat;Traction;Ultrasound;Functional mobility training;Therapeutic activities;Neuromuscular re-education;Therapeutic exercise;Patient/family education;Manual techniques;Passive range of motion;Dry needling    PT Next Visit Plan Review HEP/ROM/strengthening    PT Home Exercise Plan ER, AAROM    Consulted and Agree with Plan of Care Patient             Patient will benefit from skilled therapeutic intervention in order to improve the following deficits and impairments:  Decreased endurance, Decreased mobility, Pain, Postural dysfunction, Decreased strength, Decreased activity tolerance, Decreased range of motion, Hypomobility  Visit Diagnosis: Chronic right shoulder pain     Problem List Patient Active Problem List   Diagnosis Date Noted   Right shoulder pain 01/30/2021   Primary hypertension 01/30/2021   Non-seasonal allergic rhinitis 02/14/2020   Chest pain, non-cardiac 01/07/2020   Pulmonary nodules 01/07/2020   Essential hypertension 12/09/2019   Elevated serum creatinine 11/27/2019   Elevated BP without diagnosis of hypertension 11/27/2019   Breast pain, left 11/27/2019   Rectal bleeding 11/04/2019   Gastroesophageal reflux disease 11/04/2019   Chest pain 11/04/2019   SOB (shortness of breath) 11/04/2019   BMI 30.0-30.9,adult 11/04/2019   Encounter for HCV screening test for low risk patient 11/04/2019   Screening for HIV (human immunodeficiency virus) 11/04/2019   Encounter for screening and preventative care 11/04/2019   Tobacco chew use 11/04/2019     01/04/2020 DPT Hilda Lias, SPT  Romilda Joy,  PT 02/02/2021, 9:36 AM  [facility] [address], [place] Phone: [phone]   Fax:  [phone]  Name: Rylan Bernard MRN: [name] Date of Birth: 12-10-1960

## 2021-02-02 ENCOUNTER — Encounter: Payer: 59 | Admitting: Physical Therapy

## 2021-02-06 ENCOUNTER — Encounter: Payer: Self-pay | Admitting: Physical Therapy

## 2021-02-06 ENCOUNTER — Ambulatory Visit (INDEPENDENT_AMBULATORY_CARE_PROVIDER_SITE_OTHER): Payer: 59 | Admitting: Orthopaedic Surgery

## 2021-02-06 ENCOUNTER — Ambulatory Visit: Payer: 59 | Admitting: Physical Therapy

## 2021-02-06 ENCOUNTER — Other Ambulatory Visit: Payer: Self-pay

## 2021-02-06 DIAGNOSIS — M25511 Pain in right shoulder: Secondary | ICD-10-CM

## 2021-02-06 DIAGNOSIS — G8929 Other chronic pain: Secondary | ICD-10-CM

## 2021-02-06 NOTE — Therapy (Signed)
[place] [facility] [address] [place], [address] Phone: [phone]   Fax:  [phone]  Physical Therapy Treatment  Patient Details  Name: Craig [facility] MRN: [id number] Date of Birth: Jul 27, 1960 Referring Provider (PT): Vigg, Avanti   Encounter Date: 02/06/2021   PT End of Session - 02/06/21 0828     Visit Number 3    Number of Visits 17    Date for PT Re-Evaluation 03/20/21    Authorization Type [employer] Generic    Authorization Time Period 01/23/21-03/20/21    Progress Note Due on Visit 10    PT Start Time 0822    PT Stop Time 0900    PT Time Calculation (min) 38 min    Activity Tolerance Patient tolerated treatment well    Behavior During Therapy [facility] for tasks assessed/performed             Past Medical History:  Diagnosis Date   Allergy    Blood in stool    Heart murmur    as child   Hypertension     Past Surgical History:  Procedure Laterality Date   ANAL FISSURE REPAIR  05/1998   some type of anal fissure procedure   COLONOSCOPY WITH PROPOFOL N/A 01/31/2020   Procedure: COLONOSCOPY WITH PROPOFOL;  Surgeon: Toney Reil, MD;  Location: [facility] ENDOSCOPY;  Service: Gastroenterology;  Laterality: N/A;   ESOPHAGOGASTRODUODENOSCOPY (EGD) WITH PROPOFOL N/A 01/31/2020   Procedure: ESOPHAGOGASTRODUODENOSCOPY (EGD) WITH PROPOFOL;  Surgeon: Toney Reil, MD;  Location: [facility] ENDOSCOPY;  Service: Gastroenterology;  Laterality: N/A;    There were no vitals filed for this visit.   Subjective Assessment - 02/06/21 0826     Subjective Pt reports R shoulder pain is no better. He reports pain 6-7/10 and active participation with HEP.    Pertinent History Marlene Pfluger is a 60 year old male with c/o R shoulder pain since November 01, 2020 following throwing a softball. 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 He reports his pain currently as 4/10 and 9/10 at worst. He has tried icy/hot patches and tylenol as alleviators. Patients reports aggravating factors are lifting, carrying, driving, throwing, and sleeping. 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  Pt denies any unexplained weight fluctuation, saddle paresthesia, loss of bowel/bladder function, or unrelenting night pain at this time. Pt has a PMH of HTN, angina, and is currently being followed up for lung nodules found on chest CT.    Limitations Lifting;House hold activities    How long can you sit comfortably? na    How long can you stand comfortably? na    How long can you walk comfortably? na    Diagnostic tests xray, unremarkable aside from age-typical arthrosis    Patient Stated Goals Pt would like to reduce R shoulder pain and play travel softball            Therex   Pulleys  R shoulder Flex x 15 reps for AAROM  Pulleys R shoulder ABD x 15 reps for AAROM  Sidelying ER 4# 3 x 10 reps with cues to keep elbow tucked and elbow flex to 90deg    Seated Omega Row 55# 3 x 10 reps with cues for scapular retraction  Y foam Roller on wall with RTB 2 x 10 with cues for scapular protraction   Y on Wall with #2 DB 2 x 10 reps with emphasis on lower trap activation and scapular retraction                                   PT Short Term Goals - 01/23/21 1514       PT SHORT TERM GOAL #1   Title Pt will demonstrate independence with HEP to improve R shoulder function for increased ability to participate with ADLs    Baseline HEP given    Time 4    Period Weeks    Status New    Target Date 02/20/21               PT Long Term Goals - 01/23/21 1517       PT LONG TERM GOAL #1   Title Patient will increase FOTO score to 72 to indicate meaningful improvment in self-reported  function in ADL/IADL.    Baseline 01/23/21: 55    Time 8    Period Weeks    Status New    Target Date 03/20/21      PT LONG TERM GOAL #2   Title Patient will improve R shoulder flex/abd AROM to >150 degrees in sitting without pain greater than 2/10 on NPRS.    Baseline 01/23/21: Flex 140, ABD 110;    Time 8    Period Weeks    Status New    Target Date 03/20/21      PT LONG TERM GOAL #3   Title Pt will demonstrate accurate performance in advanced HEP for DC for long-term shoulder maintenance/strengthening to reduce long-term injury risk in sport.    Baseline 01/23/21: learning his new beginner HEP    Time 8    Period Weeks    Status New    Target Date 03/20/21      PT LONG TERM GOAL #4   Title Patient will improve all R shoulder strength to >or= to CL UE in flexion, abdct, and external/internal rotation to allow return to sport and heavy household duties, and job duties.    Baseline 01/23/21:  Flexion: 4-/5   Abduction:4-/5    IR:4-/5    ER: 4-/5    Time 8    Period Weeks    Status New    Target Date 03/20/21                    Patient will benefit from skilled therapeutic intervention in order to improve the following deficits and impairments:     Visit Diagnosis: No diagnosis found.     Problem List Patient Active Problem List   Diagnosis Date Noted   Right shoulder pain 01/30/2021   Primary hypertension 01/30/2021   Non-seasonal allergic rhinitis 02/14/2020   Chest pain, non-cardiac 01/07/2020   Pulmonary nodules 01/07/2020   Essential hypertension 12/09/2019   Elevated serum creatinine 11/27/2019   Elevated BP without diagnosis of hypertension 11/27/2019   Breast pain, left 11/27/2019   Rectal bleeding 11/04/2019   Gastroesophageal reflux disease 11/04/2019   Chest pain 11/04/2019   SOB (shortness of breath) 11/04/2019   BMI 30.0-30.9,adult 11/04/2019   Encounter for HCV screening test for low risk patient 11/04/2019   Screening for HIV (human  immunodeficiency virus) 11/04/2019   Encounter for screening and preventative care 11/04/2019  Tobacco chew use 11/04/2019   Hilda Lias DPT Romilda Joy, Student-PT 02/06/2021, 8:36 AM  [facility]TS [facility]IC[facility] [address], [place], [address] Phone: [phone]   Fax:  [phone]  Name: Craig [facility] MRN: [id number] Date of Birth: [birth year]

## 2021-02-06 NOTE — Progress Notes (Signed)
Chief Complaint: right shoulder pain     History of Present Illness:    Craig [facility] is a 60 y.o. male RHD male with right shoulder pain since August 2022.  He was throwing a ball in his softball league and subsequently felt pain.  Since that time he has significant pain and overhead weakness.  He is hardly able to lift the shoulder overhead.  He has been taking Mobic daily which does not significantly help.  He has been doing physical therapy with some limited relief although this is overall not improving his motion.  Has significant pain with laying on the shoulder.  He works as a Naval architect.    Surgical History:   None  PMH/PSH/Family History/Social History/Meds/Allergies:    Past Medical History:  Diagnosis Date   Allergy    Blood in stool    Heart murmur    as child   Hypertension    Past Surgical History:  Procedure Laterality Date   ANAL FISSURE REPAIR  05/1998   some type of anal fissure procedure   COLONOSCOPY WITH PROPOFOL N/A 01/31/2020   Procedure: COLONOSCOPY WITH PROPOFOL;  Surgeon: Toney Reil, MD;  Location: [facility] ENDOSCOPY;  Service: Gastroenterology;  Laterality: N/A;   ESOPHAGOGASTRODUODENOSCOPY (EGD) WITH PROPOFOL N/A 01/31/2020   Procedure: ESOPHAGOGASTRODUODENOSCOPY (EGD) WITH PROPOFOL;  Surgeon: Toney Reil, MD;  Location: [facility] ENDOSCOPY;  Service: Gastroenterology;  Laterality: N/A;   Social History   Socioeconomic History   Marital status: Married    Spouse name: Not on file   Number of children: Not on file   Years of education: Not on file   Highest education level: Some college, no degree  Occupational History   Occupation: trucker  Tobacco Use   Smoking status: Never   Smokeless tobacco: Current    Types: Snuff   Tobacco comments:    dipping x 25-30 years  Vaping Use   Vaping Use: Never used  Substance and Sexual Activity   Alcohol use: Yes    Comment: drinks 4-7 beers  some days and none others. 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    Social Determinants of Health   Financial Resource Strain: Not on file  Food Insecurity: Not on file  Transportation Needs: Not on file  Physical Activity: Not on file  Stress: Not on file  Social Connections: Not on file   Family History  Problem Relation Age of Onset   Heart disease Maternal Uncle    Heart disease Maternal Uncle    Arthritis Mother    Asthma Mother    Hypertension Mother    Hearing loss Father    Alcohol abuse Sister    Cancer Sister    Depression Sister    Drug abuse Sister    Early death Sister    Heart attack Sister    Asthma Daughter    Asthma Son    Alcohol abuse Maternal Grandmother    Cancer Maternal Grandmother    Cancer Maternal Grandfather    Hypertension Sister    Miscarriages / Stillbirths Sister    Asthma Sister  Depression Sister    No Known Allergies Current Outpatient Medications  Medication Sig Dispense Refill   Acetaminophen (TYLENOL PO) Take by mouth as needed.     amLODipine (NORVASC) 2.5 MG tablet Take 1 tablet (2.5 mg total) by mouth daily. 30 tablet 3   aspirin EC 81 MG tablet Take 81 mg by mouth daily. Swallow whole.     Fexofenadine-Pseudoephedrine (ALLEGRA-D PO) Take 1 tablet by mouth daily as needed (allergies).     meloxicam (MOBIC) 15 MG tablet Take 1 tablet (15 mg total) by mouth daily. 30 tablet 0   omeprazole (PRILOSEC) 20 MG capsule Take 1 capsule (20 mg total) by mouth daily. 30 capsule 3   triamcinolone (NASACORT) 55 MCG/ACT AERO nasal inhaler Place 2 sprays into the nose daily. 1 each 4   No current facility-administered medications for this visit.   No results found.  Review of Systems:   A ROS was performed including pertinent positives and negatives as documented in the  HPI.  Physical Exam :   Constitutional: NAD and appears stated age Neurological: Alert and oriented Psych: Appropriate affect and cooperative There were no vitals taken for this visit.   Comprehensive Musculoskeletal Exam:    Musculoskeletal Exam    Inspection Right Left  Skin No atrophy or winging No atrophy or winging  Palpation    Tenderness Glenohumeral as well as lateral deltoid None  Range of Motion    Flexion (passive) 100 with pain 170  Flexion (active) 90 with pain 170  Abduction 90 with pain 170  ER at the side 30 70  Can reach behind back to Back pocket T12  Strength     4/5 supra and infraspinatus with pain Normal  Special Tests    Pseudoparalytic No No  Neurologic    Fires PIN, radial, median, ulnar, musculocutaneous, axillary, suprascapular, long thoracic, and spinal accessory innervated muscles. No abnormal sensibility  Vascular/Lymphatic    Radial Pulse 2+ 2+  Cervical Exam    Patient has symmetric cervical range of motion with negative Spurling's test.  Special Test: Positive Spurling positive Neer     Imaging:   Xray (3 views right shoulder): There is mild inferior humeral osteophyte although overall well-preserved joint space.  Minor AC joint osteoarthritis   I personally reviewed and interpreted the radiographs.   Assessment:   60 year old male right-hand-dominant with right shoulder pain after a softball injury in August 2022.  Since that time he has failed multiple nonoperative treatments including activity restriction, taking Mobic, physical therapy.  He sponsors multiple softball teams and is very interested in actively playing softball which has been completely curtailed as result of this injury.  At this time I like to obtain an MRI to further assess the status of his rotator cuff as he does have weakness and pain on examination.  We will obtain this and see him back following to discuss results  Plan :    -MRI right shoulder I believe that  advance imaging in the form of an MRI is indicated for the following reasons: -Xrays images were obtained and not diagnostic -The patient has failed treatment modalities including NSAIDs, rest, activity modification, physical therapy -The following worrisome symptoms are present on history and exam: Weakness with abduction and pain with limited overhead motion     I personally saw and evaluated the patient, and participated in the management and treatment plan.  Huel Cote, MD Attending Physician, Orthopedic Surgery  This document was dictated using Dragon voice recognition  software. A reasonable attempt at proof reading has been made to minimize errors. 

## 2021-02-08 ENCOUNTER — Encounter: Payer: Self-pay | Admitting: Physical Therapy

## 2021-02-08 ENCOUNTER — Encounter: Payer: 59 | Admitting: Physical Therapy

## 2021-02-08 ENCOUNTER — Ambulatory Visit: Payer: 59 | Admitting: Physical Therapy

## 2021-02-08 DIAGNOSIS — M25511 Pain in right shoulder: Secondary | ICD-10-CM

## 2021-02-08 DIAGNOSIS — G8929 Other chronic pain: Secondary | ICD-10-CM

## 2021-02-08 NOTE — Therapy (Signed)
[facility] [address], [place], [address] Phone: [phone]   Fax:  [phone]  Physical Therapy Treatment  Patient Details  Name: Craig Finley MRN: [id number] Date of Birth: 1960-08-10 Referring Provider (PT): Vigg, Avanti   Encounter Date: 02/08/2021   PT End of Session - 02/08/21 1122     Visit Number 4    Number of Visits 17    Date for PT Re-Evaluation 03/20/21    Authorization Type [employer] Generic    Authorization Time Period 01/23/21-03/20/21    Progress Note Due on Visit 10    PT Start Time 1115    PT Stop Time 1155    PT Time Calculation (min) 40 min    Activity Tolerance Patient tolerated treatment well    Behavior During Therapy [facility] for tasks assessed/performed             Past Medical History:  Diagnosis Date   Allergy    Blood in stool    Heart murmur    as child   Hypertension     Past Surgical History:  Procedure Laterality Date   ANAL FISSURE REPAIR  05/1998   some type of anal fissure procedure   COLONOSCOPY WITH PROPOFOL N/A 01/31/2020   Procedure: COLONOSCOPY WITH PROPOFOL;  Surgeon: Toney Reil, MD;  Location: [facility] ENDOSCOPY;  Service: Gastroenterology;  Laterality: N/A;   ESOPHAGOGASTRODUODENOSCOPY (EGD) WITH PROPOFOL N/A 01/31/2020   Procedure: ESOPHAGOGASTRODUODENOSCOPY (EGD) WITH PROPOFOL;  Surgeon: Toney Reil, MD;  Location: [facility] ENDOSCOPY;  Service: Gastroenterology;  Laterality: N/A;    There were no vitals filed for this visit.   Subjective Assessment - 02/08/21 1119     Subjective Pt reports R shoulder pain has decreased to 2-3/10. He reports increase overhead R shoulder AROM.    Pertinent History Craig Finley is a 60 year old male with c/o R shoulder pain since November 01, 2020 following throwing a softball. 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 He reports his pain currently as 4/10 and 9/10 at worst. He has tried icy/hot patches and tylenol as alleviators. Patients reports aggravating factors are lifting, carrying, driving, throwing, and sleeping. 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  Pt denies any unexplained weight fluctuation, saddle paresthesia, loss of bowel/bladder function, or unrelenting night pain at this time. Pt has a PMH of HTN, angina, and is currently being followed up for lung nodules found on chest CT.    Limitations Lifting;House hold activities    How long can you sit comfortably? na    How long can you stand comfortably? na    How long can you walk comfortably? na    Diagnostic tests xray, unremarkable aside from age-typical arthrosis    Patient Stated Goals Pt would like to reduce R shoulder pain and play travel softball            Therex   Pulleys  R shoulder Flex x 15 reps for AAROM   Pulleys R shoulder ABD x 15 reps for AAROM   Sidelying ER 4# 3 x 10 reps with cues to keep elbow tucked and elbow flex to 90deg  Standing Shoulder Extension BTB 3 x 10 reps with emphasis on scapular retraction  Y on  Wall with #2 DB 2 x 10 reps with emphasis on lower trap activation and scapular retraction  Y foam Roller on wall with RTB 2 x 10 with cues for scapular protraction           PT Education - 02/08/21 1122     Education Details therex form/technique    Person(s) Educated Patient    Methods Explanation;Demonstration    Comprehension Verbalized understanding;Returned demonstration              PT Short Term Goals - 01/23/21 1514       PT SHORT TERM GOAL #1   Title Pt will demonstrate independence with HEP to improve R shoulder function for increased ability to participate with ADLs    Baseline HEP given    Time 4    Period Weeks    Status New    Target Date  02/20/21               PT Long Term Goals - 01/23/21 1517       PT LONG TERM GOAL #1   Title Patient will increase FOTO score to 72 to indicate meaningful improvment in self-reported function in ADL/IADL.    Baseline 01/23/21: 55    Time 8    Period Weeks    Status New    Target Date 03/20/21      PT LONG TERM GOAL #2   Title Patient will improve R shoulder flex/abd AROM to >150 degrees in sitting without pain greater than 2/10 on NPRS.    Baseline 01/23/21: Flex 140, ABD 110;    Time 8    Period Weeks    Status New    Target Date 03/20/21      PT LONG TERM GOAL #3   Title Pt will demonstrate accurate performance in advanced HEP for DC for long-term shoulder maintenance/strengthening to reduce long-term injury risk in sport.    Baseline 01/23/21: learning his new beginner HEP    Time 8    Period Weeks    Status New    Target Date 03/20/21      PT LONG TERM GOAL #4   Title Patient will improve all R shoulder strength to >or= to CL UE in flexion, abdct, and external/internal rotation to allow return to sport and heavy household duties, and job duties.    Baseline 01/23/21:  Flexion: 4-/5   Abduction:4-/5    IR:4-/5    ER: 4-/5    Time 8    Period Weeks    Status New    Target Date 03/20/21                   Plan - 02/08/21 1205     Clinical Impression Statement PT continued R shoulder AAROM and periscapular strengthening in overhead ranges with success. Pt tolerated session well with no increase in R shoulder pain, increase in AROM, and reports of increased functional mobility at home and work. Pt will continue to benefit from skilled therapy to address impairments and achieve set goals.    Personal Factors and Comorbidities Time since onset of injury/illness/exacerbation    Examination-Activity Limitations Sleep;Carry;Other    Examination-Participation Restrictions Driving;Occupation;Yard Work;Cleaning;Community Activity    Stability/Clinical Decision Making  Stable/Uncomplicated    Clinical Decision Making Low    Rehab Potential Good    PT Frequency 2x / week    PT Duration 8 weeks    PT Treatment/Interventions ADLs/Self Care Home Management;Cryotherapy;Electrical Stimulation;Iontophoresis 4mg /ml Dexamethasone;Moist Heat;Traction;Ultrasound;Functional mobility  training;Therapeutic activities;Neuromuscular re-education;Therapeutic exercise;Patient/family education;Manual techniques;Passive range of motion;Dry needling    PT Next Visit Plan ROM/strengthening    PT Home Exercise Plan ER, AAROM, TROW    Consulted and Agree with Plan of Care Patient             Patient will benefit from skilled therapeutic intervention in order to improve the following deficits and impairments:  Decreased endurance, Decreased mobility, Pain, Postural dysfunction, Decreased strength, Decreased activity tolerance, Decreased range of motion, Hypomobility  Visit Diagnosis: Chronic right shoulder pain     Problem List Patient Active Problem List   Diagnosis Date Noted   Right shoulder pain 01/30/2021   Primary hypertension 01/30/2021   Non-seasonal allergic rhinitis 02/14/2020   Chest pain, non-cardiac 01/07/2020   Pulmonary nodules 01/07/2020   Essential hypertension 12/09/2019   Elevated serum creatinine 11/27/2019   Elevated BP without diagnosis of hypertension 11/27/2019   Breast pain, left 11/27/2019   Rectal bleeding 11/04/2019   Gastroesophageal reflux disease 11/04/2019   Chest pain 11/04/2019   SOB (shortness of breath) 11/04/2019   BMI 30.0-30.9,adult 11/04/2019   Encounter for HCV screening test for low risk patient 11/04/2019   Screening for HIV (human immunodeficiency virus) 11/04/2019   Encounter for screening and preventative care 11/04/2019   Tobacco chew use 11/04/2019    Hilda Lias DPT Romilda Joy, SPT  Hilda Lias, PT 02/09/2021, 9:34 AM  [facility]  [facility]ICINE [address], [place], [address] Phone: [phone]   Fax:  [phone]  Name: Craig Finley MRN: [id number] Date of Birth: [birth year]

## 2021-02-13 ENCOUNTER — Ambulatory Visit: Payer: 59 | Admitting: Physical Therapy

## 2021-02-20 ENCOUNTER — Other Ambulatory Visit: Payer: Self-pay

## 2021-02-20 ENCOUNTER — Ambulatory Visit: Payer: 59 | Admitting: Physical Therapy

## 2021-02-20 ENCOUNTER — Encounter: Payer: Self-pay | Admitting: Physical Therapy

## 2021-02-20 DIAGNOSIS — G8929 Other chronic pain: Secondary | ICD-10-CM

## 2021-02-20 DIAGNOSIS — M25511 Pain in right shoulder: Secondary | ICD-10-CM | POA: Diagnosis not present

## 2021-02-20 NOTE — Therapy (Signed)
[facility] [address] [place], [address] Phone: [phone]   Fax:  [phone]  Physical Therapy Treatment  Patient Details  Name: Craig Finley MRN: [id number] Date of Birth: [birth year] Referring Provider (PT): Vigg, Avanti   Encounter Date: 02/20/2021   PT End of Session - 02/20/21 1005     Visit Number 5    Number of Visits 17    Date for PT Re-Evaluation 03/20/21    Authorization Type [employer] Generic    Progress Note Due on Visit 10    PT Start Time 0956    PT Stop Time 1034    PT Time Calculation (min) 38 min    Activity Tolerance Patient tolerated treatment well    Behavior During Therapy [facility] for tasks assessed/performed             Past Medical History:  Diagnosis Date   Allergy    Blood in stool    Heart murmur    as child   Hypertension     Past Surgical History:  Procedure Laterality Date   ANAL FISSURE REPAIR  05/1998   some type of anal fissure procedure   COLONOSCOPY WITH PROPOFOL N/A 01/31/2020   Procedure: COLONOSCOPY WITH PROPOFOL;  Surgeon: Toney Reil, MD;  Location: [facility] ENDOSCOPY;  Service: Gastroenterology;  Laterality: N/A;   ESOPHAGOGASTRODUODENOSCOPY (EGD) WITH PROPOFOL N/A 01/31/2020   Procedure: ESOPHAGOGASTRODUODENOSCOPY (EGD) WITH PROPOFOL;  Surgeon: Toney Reil, MD;  Location: [facility] ENDOSCOPY;  Service: Gastroenterology;  Laterality: N/A;    There were no vitals filed for this visit.   Subjective Assessment - 02/20/21 1002     Subjective Pt reports his motion is improving, but he is consistently having some soreness, but worse pain at night time, feels like maybe he is overdoing it with his exercises. Pain has been worse since being out of PT a couple weeks due to car trouble.    Pertinent History Craig Finley is a 60 year old male with c/o R shoulder pain since November 01, 2020 following throwing a softball. Pt states that he  plays in a senior travel league and has attempted playing once since incident but continues to be limited by pain. He reports his pain currently as 4/10 and 9/10 at worst. He has tried icy/hot patches and tylenol as alleviators. Patients reports aggravating factors are lifting, carrying, driving, throwing, and sleeping. 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  Pt denies any unexplained weight fluctuation, saddle paresthesia, loss of bowel/bladder function, or unrelenting night pain at this time. Pt has a PMH of HTN, angina, and is currently being followed up for lung nodules found on chest CT.    Limitations Lifting;House hold activities    How long can you sit comfortably? na    How long can you stand comfortably? na    How long can you walk comfortably? na    Diagnostic tests xray, unremarkable aside from age-typical arthrosis    Patient Stated Goals Pt would like to reduce R shoulder pain and play travel softball    Pain Onset More than a month ago               Therex   Pulleys  R shoulder Flex x 15 reps for AAROM   Pulleys R shoulder ABD x 15 reps for [employer]  Lat pull down 45# 3x 10  with min cuing for eccentric control  High Row 15# 2x 10 with min cuing for eccentric control with good carry over, patient reports some discomfort at end range  Push up plus from mat table x12 with cuing for proper technique with good carry over; from bosu ball (harside) x10 with good carry over of prior cuing   Alt shoulder taps in elevated push up from bosu (hardside) x12; same position alt Y x8 with good carry over of cuing for technique                             PT Education - 02/20/21 1005     Education Details therex form/technique    Person(s) Educated Patient    Methods Explanation;Demonstration;Verbal cues    Comprehension Verbalized  understanding;Returned demonstration;Verbal cues required              PT Short Term Goals - 01/23/21 1514       PT SHORT TERM GOAL #1   Title Pt will demonstrate independence with HEP to improve R shoulder function for increased ability to participate with ADLs    Baseline HEP given    Time 4    Period Weeks    Status New    Target Date 02/20/21               PT Long Term Goals - 01/23/21 1517       PT LONG TERM GOAL #1   Title Patient will increase FOTO score to 72 to indicate meaningful improvment in self-reported function in ADL/IADL.    Baseline 01/23/21: 55    Time 8    Period Weeks    Status New    Target Date 03/20/21      PT LONG TERM GOAL #2   Title Patient will improve R shoulder flex/abd AROM to >150 degrees in sitting without pain greater than 2/10 on NPRS.    Baseline 01/23/21: Flex 140, ABD 110;    Time 8    Period Weeks    Status New    Target Date 03/20/21      PT LONG TERM GOAL #3   Title Pt will demonstrate accurate performance in advanced HEP for DC for long-term shoulder maintenance/strengthening to reduce long-term injury risk in sport.    Baseline 01/23/21: learning his new beginner HEP    Time 8    Period Weeks    Status New    Target Date 03/20/21      PT LONG TERM GOAL #4   Title Patient will improve all R shoulder strength to >or= to CL UE in flexion, abdct, and external/internal rotation to allow return to sport and heavy household duties, and job duties.    Baseline 01/23/21:  Flexion: 4-/5   Abduction:4-/5    IR:4-/5    ER: 4-/5    Time 8    Period Weeks    Status New    Target Date 03/20/21                   Plan - 02/20/21 1040     Clinical Impression Statement PT continued therex progression for increased R shoulder mobility, and strength/stability of periscapular musculature with success. Patient is able to comply with all cuing for proper technique of therex with good motivation throughout session. Patient  demonstrates full AROM in all motions except abd at the end of session,  which remains painful and limited to approx 100d. Pt is having an MRI next Thursday, PT will hold on plyometric drills until then. PT will continue progression as able.    Personal Factors and Comorbidities Time since onset of injury/illness/exacerbation    Examination-Activity Limitations Sleep;Carry;Other    Examination-Participation Restrictions Driving;Occupation;Yard Work;Cleaning;Community Activity    Stability/Clinical Decision Making Stable/Uncomplicated    Clinical Decision Making Moderate    Rehab Potential Good    PT Frequency 2x / week    PT Duration 8 weeks    PT Treatment/Interventions ADLs/Self Care Home Management;Cryotherapy;Electrical Stimulation;Iontophoresis 4mg /ml Dexamethasone;Moist Heat;Traction;Ultrasound;Functional mobility training;Therapeutic activities;Neuromuscular re-education;Therapeutic exercise;Patient/family education;Manual techniques;Passive range of motion;Dry needling    PT Next Visit Plan ROM/strengthening    PT Home Exercise Plan ER, AAROM, TROW    Consulted and Agree with Plan of Care Patient             Patient will benefit from skilled therapeutic intervention in order to improve the following deficits and impairments:  Decreased endurance, Decreased mobility, Pain, Postural dysfunction, Decreased strength, Decreased activity tolerance, Decreased range of motion, Hypomobility  Visit Diagnosis: Chronic right shoulder pain     Problem List Patient Active Problem List   Diagnosis Date Noted   Right shoulder pain 01/30/2021   Primary hypertension 01/30/2021   Non-seasonal allergic rhinitis 02/14/2020   Chest pain, non-cardiac 01/07/2020   Pulmonary nodules 01/07/2020   Essential hypertension 12/09/2019   Elevated serum creatinine 11/27/2019   Elevated BP without diagnosis of hypertension 11/27/2019   Breast pain, left 11/27/2019   Rectal bleeding 11/04/2019    Gastroesophageal reflux disease 11/04/2019   Chest pain 11/04/2019   SOB (shortness of breath) 11/04/2019   BMI 30.0-30.9,adult 11/04/2019   Encounter for HCV screening test for low risk patient 11/04/2019   Screening for HIV (human immunodeficiency virus) 11/04/2019   Encounter for screening and preventative care 11/04/2019   Tobacco chew use 11/04/2019   01/04/2020 DPT Hilda Lias, PT 02/20/2021, 11:01 AM  [facility] [address], [place] Phone: [phone]   Fax:  [phone]  Name: Craig Finley MRN: [name] Date of Birth: [birth year]

## 2021-02-22 ENCOUNTER — Encounter: Payer: Self-pay | Admitting: Physical Therapy

## 2021-02-22 ENCOUNTER — Ambulatory Visit: Payer: 59 | Attending: Internal Medicine | Admitting: Physical Therapy

## 2021-02-22 DIAGNOSIS — G8929 Other chronic pain: Secondary | ICD-10-CM | POA: Diagnosis present

## 2021-02-22 DIAGNOSIS — M25511 Pain in right shoulder: Secondary | ICD-10-CM | POA: Insufficient documentation

## 2021-02-22 NOTE — Therapy (Signed)
[place] [facility] [address] [place], [address] Phone: [phone]   Fax:  [phone]  Physical Therapy Treatment  Patient Details  Name: Craig Finley MRN: [id number] Date of Birth: 05-11-1960 Referring Provider (PT): Vigg, Avanti   Encounter Date: 02/22/2021   PT End of Session - 02/22/21 0919     Visit Number 6    Number of Visits 17    Date for PT Re-Evaluation 03/20/21    Authorization - Visit Number 6    Progress Note Due on Visit 10    PT Start Time 0907    PT Stop Time 0945    PT Time Calculation (min) 38 min    Activity Tolerance Patient tolerated treatment well    Behavior During Therapy [facility] for tasks assessed/performed             Past Medical History:  Diagnosis Date   Allergy    Blood in stool    Heart murmur    as child   Hypertension     Past Surgical History:  Procedure Laterality Date   ANAL FISSURE REPAIR  05/1998   some type of anal fissure procedure   COLONOSCOPY WITH PROPOFOL N/A 01/31/2020   Procedure: COLONOSCOPY WITH PROPOFOL;  Surgeon: Toney Reil, MD;  Location: [facility] ENDOSCOPY;  Service: Gastroenterology;  Laterality: N/A;   ESOPHAGOGASTRODUODENOSCOPY (EGD) WITH PROPOFOL N/A 01/31/2020   Procedure: ESOPHAGOGASTRODUODENOSCOPY (EGD) WITH PROPOFOL;  Surgeon: Toney Reil, MD;  Location: [facility] ENDOSCOPY;  Service: Gastroenterology;  Laterality: N/A;    There were no vitals filed for this visit.   Subjective Assessment - 02/22/21 0911     Subjective Reports his shoulder is feeling good today, that he had a rough night last night, had trouble sleeping becuase of his shoulder. Minimal pain this am, subsides mostly after he gets moving- feels stiff first thing in the am.    Pertinent History Craig Finley is a 60 year old male with c/o R shoulder pain since November 01, 2020 following throwing a softball. Pt states that he plays in a senior travel  league and has attempted playing once since incident but continues to be limited by pain. He reports his pain currently as 4/10 and 9/10 at worst. He has tried icy/hot patches and tylenol as alleviators. Patients reports aggravating factors are lifting, carrying, driving, throwing, and sleeping. 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  Pt denies any unexplained weight fluctuation, saddle paresthesia, loss of bowel/bladder function, or unrelenting night pain at this time. Pt has a PMH of HTN, angina, and is currently being followed up for lung nodules found on chest CT.    Limitations Lifting;House hold activities    How long can you sit comfortably? na    How long can you stand comfortably? na    How long can you walk comfortably? na    Diagnostic tests xray, unremarkable aside from age-typical arthrosis    Patient Stated Goals Pt would like to reduce R shoulder pain and play travel softball    Pain Onset More than a month ago              Therex   TRX overhead flex and abd AAROM x12 each with 3sec hold   Lat pull down 45# 2x 10  with min cuing for eccentric control  Attempted 90/90  ER BW patient unable without pain, modified to: Neutral R shoulder ER GTB x10; BluTB 2x 10 with focus on eccentric control with good carry over   D2 <> D1 bilat sword pulls RTB 2x 12 (6 each direction) with min cuing for eccentric control   Alt shoulder taps in elevated push up from bosu (hardside) x12 cuing to maintain neutral spine.                               PT Education - 02/22/21 0915     Education Details therex form/technique    Person(s) Educated Patient    Methods Explanation;Demonstration;Verbal cues    Comprehension Verbalized understanding;Returned demonstration;Verbal cues required              PT Short Term Goals - 01/23/21 1514       PT SHORT  TERM GOAL #1   Title Pt will demonstrate independence with HEP to improve R shoulder function for increased ability to participate with ADLs    Baseline HEP given    Time 4    Period Weeks    Status New    Target Date 02/20/21               PT Long Term Goals - 01/23/21 1517       PT LONG TERM GOAL #1   Title Patient will increase FOTO score to 72 to indicate meaningful improvment in self-reported function in ADL/IADL.    Baseline 01/23/21: 55    Time 8    Period Weeks    Status New    Target Date 03/20/21      PT LONG TERM GOAL #2   Title Patient will improve R shoulder flex/abd AROM to >150 degrees in sitting without pain greater than 2/10 on NPRS.    Baseline 01/23/21: Flex 140, ABD 110;    Time 8    Period Weeks    Status New    Target Date 03/20/21      PT LONG TERM GOAL #3   Title Pt will demonstrate accurate performance in advanced HEP for DC for long-term shoulder maintenance/strengthening to reduce long-term injury risk in sport.    Baseline 01/23/21: learning his new beginner HEP    Time 8    Period Weeks    Status New    Target Date 03/20/21      PT LONG TERM GOAL #4   Title Patient will improve all R shoulder strength to >or= to CL UE in flexion, abdct, and external/internal rotation to allow return to sport and heavy household duties, and job duties.    Baseline 01/23/21:  Flexion: 4-/5   Abduction:4-/5    IR:4-/5    ER: 4-/5    Time 8    Period Weeks    Status New    Target Date 03/20/21                   Plan - 02/22/21 9371     Clinical Impression Statement PT continued therex progression for increased R shoulder mobility and strength/stability of RTC and periscapulars with success. Patient with pain with combined overhead and rotational motions, no pain with therex completed during session. Pt is able to comply with all cuing for proper technique of therex with good motivation throughout session. Pt is demonstrating increased motion, with  pain limiting nearly all combine motions and overhead resisted therex.  PT will continue progression as  able.    Personal Factors and Comorbidities Time since onset of injury/illness/exacerbation    Examination-Activity Limitations Sleep;Carry;Other    Examination-Participation Restrictions Driving;Occupation;Yard Work;Cleaning;Community Activity    Stability/Clinical Decision Making Stable/Uncomplicated    Clinical Decision Making Moderate    Rehab Potential Good    PT Frequency 2x / week    PT Duration 8 weeks    PT Treatment/Interventions ADLs/Self Care Home Management;Cryotherapy;Electrical Stimulation;Iontophoresis 4mg /ml Dexamethasone;Moist Heat;Traction;Ultrasound;Functional mobility training;Therapeutic activities;Neuromuscular re-education;Therapeutic exercise;Patient/family education;Manual techniques;Passive range of motion;Dry needling    PT Next Visit Plan ROM/strengthening    PT Home Exercise Plan ER, AAROM, TROW    Consulted and Agree with Plan of Care Patient             Patient will benefit from skilled therapeutic intervention in order to improve the following deficits and impairments:  Decreased endurance, Decreased mobility, Pain, Postural dysfunction, Decreased strength, Decreased activity tolerance, Decreased range of motion, Hypomobility  Visit Diagnosis: Chronic right shoulder pain     Problem List Patient Active Problem List   Diagnosis Date Noted   Right shoulder pain 01/30/2021   Primary hypertension 01/30/2021   Non-seasonal allergic rhinitis 02/14/2020   Chest pain, non-cardiac 01/07/2020   Pulmonary nodules 01/07/2020   Essential hypertension 12/09/2019   Elevated serum creatinine 11/27/2019   Elevated BP without diagnosis of hypertension 11/27/2019   Breast pain, left 11/27/2019   Rectal bleeding 11/04/2019   Gastroesophageal reflux disease 11/04/2019   Chest pain 11/04/2019   SOB (shortness of breath) 11/04/2019   BMI 30.0-30.9,adult  11/04/2019   Encounter for HCV screening test for low risk patient 11/04/2019   Screening for HIV (human immunodeficiency virus) 11/04/2019   Encounter for screening and preventative care 11/04/2019   Tobacco chew use 11/04/2019   01/04/2020 DPT Hilda Lias, PT 02/22/2021, 9:49 AM  [facility] [address], [place] Phone: [phone]   Fax:  [phone]  Name: Craig Finley MRN: [name] Date of Birth: [birth year]

## 2021-02-27 ENCOUNTER — Encounter: Payer: Self-pay | Admitting: Physical Therapy

## 2021-02-27 ENCOUNTER — Encounter: Payer: 59 | Admitting: Internal Medicine

## 2021-02-27 ENCOUNTER — Ambulatory Visit: Payer: 59 | Admitting: Physical Therapy

## 2021-02-27 DIAGNOSIS — M25511 Pain in right shoulder: Secondary | ICD-10-CM

## 2021-02-27 DIAGNOSIS — G8929 Other chronic pain: Secondary | ICD-10-CM

## 2021-02-27 NOTE — Therapy (Signed)
[place] [facility] [address], [place], [address] Phone: [phone]   Fax:  [phone]  Physical Therapy Treatment  Patient Details  Name: Craig Finley MRN: [id number] Date of Birth: [birth year] Referring Provider (PT): Vigg, Avanti   Encounter Date: 02/27/2021   PT End of Session - 02/27/21 0918     Visit Number 7    Number of Visits 17    Date for PT Re-Evaluation 03/20/21    Authorization Type [employer]    Authorization Time Period 01/23/21-03/20/21    Authorization - Visit Number 7    Progress Note Due on Visit 10    PT Start Time 0913    PT Stop Time 0942    PT Time Calculation (min) 29 min    Activity Tolerance Patient tolerated treatment well    Behavior During Therapy [facility] for tasks assessed/performed             Past Medical History:  Diagnosis Date   Allergy    Blood in stool    Heart murmur    as child   Hypertension     Past Surgical History:  Procedure Laterality Date   ANAL FISSURE REPAIR  05/1998   some type of anal fissure procedure   COLONOSCOPY WITH PROPOFOL N/A 01/31/2020   Procedure: COLONOSCOPY WITH PROPOFOL;  Surgeon: Toney Reil, MD;  Location: [facility] ENDOSCOPY;  Service: Gastroenterology;  Laterality: N/A;   ESOPHAGOGASTRODUODENOSCOPY (EGD) WITH PROPOFOL N/A 01/31/2020   Procedure: ESOPHAGOGASTRODUODENOSCOPY (EGD) WITH PROPOFOL;  Surgeon: Toney Reil, MD;  Location: [facility] ENDOSCOPY;  Service: Gastroenterology;  Laterality: N/A;    There were no vitals filed for this visit.   Subjective Assessment - 02/27/21 0917     Subjective Patient reports some soreness today, minimal pain. reports this is worse on the weekend (thinks it is due to decreased activity on the weekend). No other changes to note    Pertinent History Ahmarion Saraceno is a 60 year old male with c/o R shoulder pain since November 01, 2020 following throwing a softball. Pt states  that he plays in a senior travel league and has attempted playing once since incident but continues to be limited by pain. He reports his pain currently as 4/10 and 9/10 at worst. He has tried icy/hot patches and tylenol as alleviators. Patients reports aggravating factors are lifting, carrying, driving, throwing, and sleeping. 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  Pt denies any unexplained weight fluctuation, saddle paresthesia, loss of bowel/bladder function, or unrelenting night pain at this time. Pt has a PMH of HTN, angina, and is currently being followed up for lung nodules found on chest CT.    Limitations Lifting;House hold activities    How long can you sit comfortably? na    How long can you stand comfortably? na    How long can you walk comfortably? na    Diagnostic tests xray, unremarkable aside from age-typical arthrosis    Patient Stated Goals Pt would like to reduce R shoulder pain and play travel softball    Pain Onset More than a month ago                Therex   TRX overhead flex and abd AAROM x12 each with 3sec hold   Unilateral high row 5# 2 x10;  10# x8; with  good carry over of cuing for proper technique without rotation compensation  Kimberly-Clark rows 10# x7; 5# 2x 10 with min cuing to prevent rotation compensation (better with 5#)  D2 flex RUE + D2 LUE ext RTB 2x 6  with min cuing for eccentric control                            PT Education - 02/27/21 0918     Education Details therex form/technique    Person(s) Educated Patient    Methods Explanation;Demonstration;Verbal cues    Comprehension Verbalized understanding;Verbal cues required;Returned demonstration              PT Short Term Goals - 01/23/21 1514       PT SHORT TERM GOAL #1   Title Pt will demonstrate independence with HEP to improve R shoulder function  for increased ability to participate with ADLs    Baseline HEP given    Time 4    Period Weeks    Status New    Target Date 02/20/21               PT Long Term Goals - 01/23/21 1517       PT LONG TERM GOAL #1   Title Patient will increase FOTO score to 72 to indicate meaningful improvment in self-reported function in ADL/IADL.    Baseline 01/23/21: 55    Time 8    Period Weeks    Status New    Target Date 03/20/21      PT LONG TERM GOAL #2   Title Patient will improve R shoulder flex/abd AROM to >150 degrees in sitting without pain greater than 2/10 on NPRS.    Baseline 01/23/21: Flex 140, ABD 110;    Time 8    Period Weeks    Status New    Target Date 03/20/21      PT LONG TERM GOAL #3   Title Pt will demonstrate accurate performance in advanced HEP for DC for long-term shoulder maintenance/strengthening to reduce long-term injury risk in sport.    Baseline 01/23/21: learning his new beginner HEP    Time 8    Period Weeks    Status New    Target Date 03/20/21      PT LONG TERM GOAL #4   Title Patient will improve all R shoulder strength to >or= to CL UE in flexion, abdct, and external/internal rotation to allow return to sport and heavy household duties, and job duties.    Baseline 01/23/21:  Flexion: 4-/5   Abduction:4-/5    IR:4-/5    ER: 4-/5    Time 8    Period Weeks    Status New    Target Date 03/20/21                   Plan - 02/27/21 0919     Clinical Impression Statement Session shortened d/t patient arriving late. Patient is able to comply with all cuing for proper technique of therex with good motivation throughout session. Patient reports minimal pain throughout session, therex modified for this with successs. Pt to have MRI 12/8, PT will follow up as appropriate from this.    Personal Factors and Comorbidities Time since onset of injury/illness/exacerbation    Examination-Activity Limitations Sleep;Carry;Other    Examination-Participation  Restrictions Driving;Occupation;Yard Work;Cleaning;Community Activity    Stability/Clinical Decision Making Stable/Uncomplicated    Clinical Decision Making Moderate  Rehab Potential Good    PT Frequency 2x / week    PT Duration 8 weeks    PT Treatment/Interventions ADLs/Self Care Home Management;Cryotherapy;Electrical Stimulation;Iontophoresis 4mg /ml Dexamethasone;Moist Heat;Traction;Ultrasound;Functional mobility training;Therapeutic activities;Neuromuscular re-education;Therapeutic exercise;Patient/family education;Manual techniques;Passive range of motion;Dry needling    PT Next Visit Plan ROM/strengthening    PT Home Exercise Plan ER, AAROM, TROW    Consulted and Agree with Plan of Care Patient             Patient will benefit from skilled therapeutic intervention in order to improve the following deficits and impairments:  Decreased endurance, Decreased mobility, Pain, Postural dysfunction, Decreased strength, Decreased activity tolerance, Decreased range of motion, Hypomobility  Visit Diagnosis: Chronic right shoulder pain     Problem List Patient Active Problem List   Diagnosis Date Noted   Right shoulder pain 01/30/2021   Primary hypertension 01/30/2021   Non-seasonal allergic rhinitis 02/14/2020   Chest pain, non-cardiac 01/07/2020   Pulmonary nodules 01/07/2020   Essential hypertension 12/09/2019   Elevated serum creatinine 11/27/2019   Elevated BP without diagnosis of hypertension 11/27/2019   Breast pain, left 11/27/2019   Rectal bleeding 11/04/2019   Gastroesophageal reflux disease 11/04/2019   Chest pain 11/04/2019   SOB (shortness of breath) 11/04/2019   BMI 30.0-30.9,adult 11/04/2019   Encounter for HCV screening test for low risk patient 11/04/2019   Screening for HIV (human immunodeficiency virus) 11/04/2019   Encounter for screening and preventative care 11/04/2019   Tobacco chew use 11/04/2019   01/04/2020 DPT Hilda Lias,  PT 02/27/2021, 9:42 AM  [place] [facility] [address], [address], [place] Phone: [phone]   Fax:  [phone]  Name: Norval Slaven MRN: [name] Date of Birth: 11-01-1960

## 2021-03-01 ENCOUNTER — Other Ambulatory Visit: Payer: Self-pay

## 2021-03-01 ENCOUNTER — Ambulatory Visit
Admission: RE | Admit: 2021-03-01 | Discharge: 2021-03-01 | Disposition: A | Discharge status: Home / Self Care | Payer: 59 | Source: Ambulatory Visit | Attending: Orthopaedic Surgery | Admitting: Orthopaedic Surgery

## 2021-03-01 ENCOUNTER — Encounter: Payer: 59 | Admitting: Internal Medicine

## 2021-03-01 ENCOUNTER — Ambulatory Visit (INDEPENDENT_AMBULATORY_CARE_PROVIDER_SITE_OTHER): Payer: 59 | Admitting: Orthopaedic Surgery

## 2021-03-01 ENCOUNTER — Encounter: Payer: Self-pay | Admitting: Physical Therapy

## 2021-03-01 ENCOUNTER — Ambulatory Visit (HOSPITAL_BASED_OUTPATIENT_CLINIC_OR_DEPARTMENT_OTHER): Payer: Self-pay | Admitting: Orthopaedic Surgery

## 2021-03-01 ENCOUNTER — Other Ambulatory Visit (HOSPITAL_BASED_OUTPATIENT_CLINIC_OR_DEPARTMENT_OTHER): Payer: Self-pay

## 2021-03-01 ENCOUNTER — Ambulatory Visit: Payer: 59 | Admitting: Physical Therapy

## 2021-03-01 DIAGNOSIS — S46011A Strain of muscle(s) and tendon(s) of the rotator cuff of right shoulder, initial encounter: Secondary | ICD-10-CM

## 2021-03-01 DIAGNOSIS — G8929 Other chronic pain: Secondary | ICD-10-CM

## 2021-03-01 DIAGNOSIS — M25511 Pain in right shoulder: Secondary | ICD-10-CM

## 2021-03-01 MED ORDER — IBUPROFEN 800 MG PO TABS
800.0000 mg | ORAL_TABLET | Freq: Three times a day (TID) | ORAL | 0 refills | Status: AC
  Filled 2021-03-01: qty 30, 10d supply, fill #0

## 2021-03-01 MED ORDER — OXYCODONE HCL 5 MG PO TABS
5.0000 mg | ORAL_TABLET | ORAL | 0 refills | Status: DC | PRN
  Filled 2021-03-01: qty 20, 4d supply, fill #0

## 2021-03-01 MED ORDER — ASPIRIN EC 325 MG PO TBEC
325.0000 mg | DELAYED_RELEASE_TABLET | Freq: Every day | ORAL | 0 refills | Status: DC
  Filled 2021-03-01: qty 30, 30d supply, fill #0

## 2021-03-01 MED ORDER — ACETAMINOPHEN 500 MG PO TABS
500.0000 mg | ORAL_TABLET | Freq: Three times a day (TID) | ORAL | 0 refills | Status: AC
  Filled 2021-03-01: qty 30, 10d supply, fill #0

## 2021-03-01 NOTE — Progress Notes (Signed)
Chief Complaint: right shoulder pain     History of Present Illness:   03/01/2021: Presents today for follow-up of his right shoulder MRI.  He has been doing physical therapy and taking anti-inflammatories with very minimal relief.  He is here today with continued pain and inability to sleep.  This is affecting basic activities of daily living   Craig Finley is a 60 y.o. male RHD male with right shoulder pain since August 2022.  He was throwing a ball in his softball league and subsequently felt pain.  Since that time he has significant pain and overhead weakness.  He is hardly able to lift the shoulder overhead.  He has been taking Mobic daily which does not significantly help.  He has been doing physical therapy with some limited relief although this is overall not improving his motion.  Has significant pain with laying on the shoulder.  He works as a Administrator.    Surgical History:   None  PMH/PSH/Family History/Social History/Meds/Allergies:    Past Medical History:  Diagnosis Date   Allergy    Blood in stool    Heart murmur    as child   Hypertension    Past Surgical History:  Procedure Laterality Date   ANAL FISSURE REPAIR  05/1998   some type of anal fissure procedure   COLONOSCOPY WITH PROPOFOL N/A 01/31/2020   Procedure: COLONOSCOPY WITH PROPOFOL;  Surgeon: Lin Landsman, MD;  Location: [facility] ENDOSCOPY;  Service: Gastroenterology;  Laterality: N/A;   ESOPHAGOGASTRODUODENOSCOPY (EGD) WITH PROPOFOL N/A 01/31/2020   Procedure: ESOPHAGOGASTRODUODENOSCOPY (EGD) WITH PROPOFOL;  Surgeon: Lin Landsman, MD;  Location: [facility] ENDOSCOPY;  Service: Gastroenterology;  Laterality: N/A;   Social History   Socioeconomic History   Marital status: Married    Spouse name: Not on file   Number of children: Not on file   Years of education: Not on file   Highest education level: Some college, no degree  Occupational History    Occupation: trucker  Tobacco Use   Smoking status: Never   Smokeless tobacco: Current    Types: Snuff   Tobacco comments:    dipping x 25-30 years  Vaping Use   Vaping Use: Never used  Substance and Sexual Activity   Alcohol use: Yes    Comment: drinks 4-7 beers some days and none others. 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    Social Determinants of Health   Financial Resource Strain: Not on file  Food Insecurity: Not on file  Transportation Needs: Not on file  Physical Activity: Not on file  Stress: Not on file  Social Connections: Not on file   Family History  Problem Relation Age of Onset   Heart disease Maternal Uncle    Heart disease Maternal Uncle    Arthritis Mother    Asthma Mother    Hypertension Mother    Hearing loss Father    Alcohol abuse Sister    Cancer Sister    Depression Sister    Drug abuse Sister    Early death Sister    Heart attack Sister  Asthma Daughter    Asthma Son    Alcohol abuse Maternal Grandmother    Cancer Maternal Grandmother    Cancer Maternal Grandfather    Hypertension Sister    Miscarriages / Stillbirths Sister    Asthma Sister    Depression Sister    No Known Allergies Current Outpatient Medications  Medication Sig Dispense Refill   Acetaminophen (TYLENOL PO) Take by mouth as needed.     amLODipine (NORVASC) 2.5 MG tablet Take 1 tablet (2.5 mg total) by mouth daily. 30 tablet 3   aspirin EC 81 MG tablet Take 81 mg by mouth daily. Swallow whole.     Fexofenadine-Pseudoephedrine (ALLEGRA-D PO) Take 1 tablet by mouth daily as needed (allergies).     meloxicam (MOBIC) 15 MG tablet Take 1 tablet (15 mg total) by mouth daily. 30 tablet 0   omeprazole (PRILOSEC) 20 MG capsule Take 1 capsule (20 mg total) by mouth daily. 30 capsule 3   triamcinolone (NASACORT) 55 MCG/ACT  AERO nasal inhaler Place 2 sprays into the nose daily. 1 each 4   No current facility-administered medications for this visit.   MR Shoulder Right Wo Contrast  Result Date: 03/01/2021 CLINICAL DATA:  Right shoulder pain with weakness and limited range of motion due to a softball injury 10/2020. Assess for labral tear. EXAM: MRI OF THE RIGHT SHOULDER WITHOUT CONTRAST TECHNIQUE: Multiplanar, multisequence MR imaging of the shoulder was performed. No intravenous contrast was administered. COMPARISON:  X-ray shoulder 01/09/2021. FINDINGS: Rotator cuff: Full-thickness, nearly full-width tear of the distal supraspinatus tendon anteriorly with 6 mm of tendon retraction. Tear measures 16 mm in AP dimension. High-grade articular sided tears of the posterior infraspinatus tendon insertion. Additional interstitial tear of the infraspinatus tendon extending to the myotendinous junction. Background of mild-moderate rotator cuff tendinosis. Subscapularis and teres minor tendons intact. Muscles: Preserved bulk and signal intensity of the rotator cuff musculature without edema, atrophy, or fatty infiltration. Biceps long head: Intact and normally positioned. Acromioclavicular Joint: Moderate arthropathy of the AC joint. Small volume subacromial-subdeltoid bursal fluid communicating with the glenohumeral joint. Glenohumeral Joint: Mild glenohumeral chondral thinning. Trace joint effusion. Labrum:  Grossly intact.  No paralabral cyst. Bones:  No acute fracture.  No dislocation.  No focal bone lesion. Other: None. IMPRESSION: 1. Full-thickness, nearly full-width tear of the distal supraspinatus tendon with mild retraction. 2. High-grade articular sided tears of the posterior infraspinatus tendon insertion. Additional interstitial tear of the infraspinatus tendon extending to the myotendinous junction. 3. Moderate acromioclavicular and mild glenohumeral joint osteoarthritis. Electronically Signed   By: Davina Poke D.O.   On:  03/01/2021 10:53    Review of Systems:   A ROS was performed including pertinent positives and negatives as documented in the HPI.  Physical Exam :   Constitutional: NAD and appears stated age Neurological: Alert and oriented Psych: Appropriate affect and cooperative There were no vitals taken for this visit.   Comprehensive Musculoskeletal Exam:    Musculoskeletal Exam    Inspection Right Left  Skin No atrophy or winging No atrophy or winging  Palpation    Tenderness Glenohumeral as well as lateral deltoid None  Range of Motion    Flexion (passive) 100 with pain 170  Flexion (active) 90 with pain 170  Abduction 90 with pain 170  ER at the side 30 70  Can reach behind back to Back pocket T12  Strength     4/5 supra and infraspinatus with pain Normal  Special Tests  Pseudoparalytic No No  Neurologic    Fires PIN, radial, median, ulnar, musculocutaneous, axillary, suprascapular, long thoracic, and spinal accessory innervated muscles. No abnormal sensibility  Vascular/Lymphatic    Radial Pulse 2+ 2+  Cervical Exam    Patient has symmetric cervical range of motion with negative Spurling's test.  Special Test: Positive Spurling positive Neer   Nontender about his biceps  Imaging:   Xray (3 views right shoulder): There is mild inferior humeral osteophyte although overall well-preserved joint space.  Minor AC joint osteoarthritis  MRI right shoulder: Full-thickness rotator cuff tear involving supraspinatus, some fluid around the biceps   I personally reviewed and interpreted the radiographs.   Assessment:   60 year old male right-hand-dominant with right shoulder pain after a softball injury in August 2022.  Since that time he has failed multiple nonoperative treatments including activity restriction, taking Mobic, physical therapy.  He sponsors multiple softball teams and is very interested in actively playing softball which has been completely curtailed as result of  this injury.  He continues to have minimal relief with physical therapy.  At this time I would like to plan for right shoulder arthroscopic rotator cuff repair.  I do not believe that a biceps tenodesis will likely be necessary but ultimately I will plan to assess the biceps tendon at the time of arthroscopic surgery.  I described the risks and benefits to him and he would like to proceed with this  Plan :    -Plan for right shoulder arthroscopy with rotator cuff repair    After a lengthy discussion of treatment options, including risks, benefits, alternatives, complications of surgical and nonsurgical conservative options, the patient elected surgical repair.   The patient  is aware of the material risks  and complications including, but not limited to injury to adjacent structures, neurovascular injury, infection, numbness, bleeding, implant failure, thermal burns, stiffness, persistent pain, failure to heal, disease transmission from allograft, need for further surgery, dislocation, anesthetic risks, blood clots, risks of death,and others. The probabilities of surgical success and failure discussed with patient given their particular co-morbidities.The time and nature of expected rehabilitation and recovery was discussed.The patient's questions were all answered preoperatively.  No barriers to understanding were noted. I explained the natural history of the disease process and Rx rationale.  I explained to the patient what I considered to be reasonable expectations given their personal situation.  The final treatment plan was arrived at through a shared patient decision making process model.   Patient was prescribed a shoulder immobilizer today for the diagnosis listed above under assessment. The patient requires stabilization from this orthosis in their right arm.     I personally saw and evaluated the patient, and participated in the management and treatment plan.  Huel Cote,  MD Attending Physician, Orthopedic Surgery  This document was dictated using Dragon voice recognition software. A reasonable attempt at proof reading has been made to minimize errors.

## 2021-03-01 NOTE — Patient Instructions (Signed)
Disability and Out-of-Work Paperwork  If you would like to file disability (short term and/or and long term), FMLA, or other out-of-work paperwork, please mail, hand deliver, or fax the paperwork to our main office:   [facility] [address]  [address] [place], [place] [address] Phone: [phone] Fax: [phone]  We are unable to complete these forms in our [facility] satellite office and want to ensure your paperwork is filed in an appropriate and proper manner.   Thank you for understanding.    Pre-Operative Appointment  Post (after) Operative Medications:   You were likely prescribed 4 medications:  A. Acetaminophen (Tylenol) 500 MG B. Ibuprofen (Advil) 800 MG C. Oxycodone 5 MG D. Aspirin 325 MG  These medications do not need to be taken until AFTER  surgery. All medications should be taken according to prescription, pharmacy and prescriber direction.   These medications can be picked up from the [facility] on the first floor of [facility] immediately after your appointment.   Aspirin should be taken once a day for 30 days following surgery to prevent complications, such as blood clots.   Oxycodone is an opioid and should be used for breakthrough pain.   Acetaminophen and Ibuprofen should be used intermittently for pain control.    2. Surgery Scheduling  Our surgery scheduler, Chrystine Oiler, will be in contact with you to schedule your surgery. She will give you details on the day, time and the location your surgery will be performed. Chrystine Oiler will also schedule your first post-operative appointment 2 weeks after your scheduled surgery date. This appointment will be in our office at [facility], not the location where surgery is performed.   Chrystine Oiler  [phone] Phone [phone] Fax [email or web address]  Closer to the date of your surgery, a nurse  from the hospital or surgery center will contact you about specific details prior to surgery such as when to cease eating, when to arrive at the facility, and what you can expect the day of surgery.    3. Durable Medical Equipment:  If you were given a medical device at your appointment such as, shoulder sling, post operative knee brace, or walking boot, you will bring that to the hospital/surgery center with you when you check in the day of surgery.   Be sure to inform the pre-operative staff that the equipment should go with you to the operating room.   You will wake up after surgery with the equipment on and will continue to wear it as explained by Dr. Steward Drone.

## 2021-03-01 NOTE — Therapy (Signed)
[facility] [address] [place], [address] Phone: [phone]   Fax:  [phone]  Physical Therapy Treatment  Patient Details  Name: Harshaan Whang MRN: [id number] Date of Birth: 1960/05/21 Referring Provider (PT): Vigg, Avanti   Encounter Date: 03/01/2021   PT End of Session - 03/01/21 1020     Visit Number 8    Number of Visits 17    Date for PT Re-Evaluation 03/20/21    Authorization Type [employer] Generic    Authorization Time Period 01/23/21-03/20/21    Authorization - Visit Number 8    Progress Note Due on Visit 10    PT Start Time 0945    PT Stop Time 1025    PT Time Calculation (min) 40 min    Activity Tolerance Patient tolerated treatment well    Behavior During Therapy [facility] for tasks assessed/performed             Past Medical History:  Diagnosis Date   Allergy    Blood in stool    Heart murmur    as child   Hypertension     Past Surgical History:  Procedure Laterality Date   ANAL FISSURE REPAIR  05/1998   some type of anal fissure procedure   COLONOSCOPY WITH PROPOFOL N/A 01/31/2020   Procedure: COLONOSCOPY WITH PROPOFOL;  Surgeon: Toney Reil, MD;  Location: [facility] ENDOSCOPY;  Service: Gastroenterology;  Laterality: N/A;   ESOPHAGOGASTRODUODENOSCOPY (EGD) WITH PROPOFOL N/A 01/31/2020   Procedure: ESOPHAGOGASTRODUODENOSCOPY (EGD) WITH PROPOFOL;  Surgeon: Toney Reil, MD;  Location: [facility] ENDOSCOPY;  Service: Gastroenterology;  Laterality: N/A;    There were no vitals filed for this visit.   Subjective Assessment - 03/01/21 0958     Subjective Patient had MRi prior to appt and feels a little "stiff" from this. Paitent reports minimal pain today, mostly stiffness and soreness. No changes since last visit.    Pertinent History Lino Wickliff is a 60 year old male with c/o R shoulder pain since November 01, 2020 following throwing a softball. Pt states that he  plays in a senior travel league and has attempted playing once since incident but continues to be limited by pain. He reports his pain currently as 4/10 and 9/10 at worst. He has tried icy/hot patches and tylenol as alleviators. Patients reports aggravating factors are lifting, carrying, driving, throwing, and sleeping. 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  Pt denies any unexplained weight fluctuation, saddle paresthesia, loss of bowel/bladder function, or unrelenting night pain at this time. Pt has a PMH of HTN, angina, and is currently being followed up for lung nodules found on chest CT.    Limitations Lifting;House hold activities    How long can you sit comfortably? na    How long can you stand comfortably? na    How long can you walk comfortably? na    Diagnostic tests xray, unremarkable aside from age-typical arthrosis    Patient Stated Goals Pt would like to reduce R shoulder pain and play travel softball    Pain Onset More than a month ago             Therex   TRX overhead flex and abd AAROM x12 each with 3sec hold   Assisted pull up total gym 3x 5 (highest level 26 45d) with min cuing for  even pull   90/90 ER to approx 45d with 1# x8; BW to approx 45d x8 within pain free range  Prone row at 90d 3x 8 BW with no pain, fatigue, min cuing for scapular retraction   D2 flex RUE + D2 LUE ext RTB 2x 8  with min cuing for eccentric control    Lat stretch 30sec                           PT Education - 03/01/21 1019     Education Details therex form/technique    Person(s) Educated Patient    Methods Explanation;Demonstration;Verbal cues    Comprehension Verbalized understanding;Returned demonstration;Verbal cues required              PT Short Term Goals - 01/23/21 1514       PT SHORT TERM GOAL #1   Title Pt will demonstrate independence  with HEP to improve R shoulder function for increased ability to participate with ADLs    Baseline HEP given    Time 4    Period Weeks    Status New    Target Date 02/20/21               PT Long Term Goals - 01/23/21 1517       PT LONG TERM GOAL #1   Title Patient will increase FOTO score to 72 to indicate meaningful improvment in self-reported function in ADL/IADL.    Baseline 01/23/21: 55    Time 8    Period Weeks    Status New    Target Date 03/20/21      PT LONG TERM GOAL #2   Title Patient will improve R shoulder flex/abd AROM to >150 degrees in sitting without pain greater than 2/10 on NPRS.    Baseline 01/23/21: Flex 140, ABD 110;    Time 8    Period Weeks    Status New    Target Date 03/20/21      PT LONG TERM GOAL #3   Title Pt will demonstrate accurate performance in advanced HEP for DC for long-term shoulder maintenance/strengthening to reduce long-term injury risk in sport.    Baseline 01/23/21: learning his new beginner HEP    Time 8    Period Weeks    Status New    Target Date 03/20/21      PT LONG TERM GOAL #4   Title Patient will improve all R shoulder strength to >or= to CL UE in flexion, abdct, and external/internal rotation to allow return to sport and heavy household duties, and job duties.    Baseline 01/23/21:  Flexion: 4-/5   Abduction:4-/5    IR:4-/5    ER: 4-/5    Time 8    Period Weeks    Status New    Target Date 03/20/21                   Plan - 03/01/21 1032     Clinical Impression Statement PT continued therex progression for increased complex movements, for progression toward sport specific movements. PT modified therex within pain parameters. Patient does report some painless popping with last exercise( PNF pulls). Pt is able to comply with all cuing for proper technique of therex with good motivation throughout session. PT will reassess POC after MRI results, continuing progression with increased sport and plyometric focus as  results allow.    Personal Factors and Comorbidities Time since onset of  injury/illness/exacerbation    Examination-Activity Limitations Sleep;Carry;Other    Examination-Participation Restrictions Driving;Occupation;Yard Work;Cleaning;Community Activity    Stability/Clinical Decision Making Evolving/Moderate complexity    Clinical Decision Making Moderate    Rehab Potential Good    PT Frequency 2x / week    PT Duration 8 weeks    PT Treatment/Interventions ADLs/Self Care Home Management;Cryotherapy;Electrical Stimulation;Iontophoresis 4mg /ml Dexamethasone;Moist Heat;Traction;Ultrasound;Functional mobility training;Therapeutic activities;Neuromuscular re-education;Therapeutic exercise;Patient/family education;Manual techniques;Passive range of motion;Dry needling    PT Next Visit Plan ROM/strengthening    PT Home Exercise Plan ER, AAROM, TROW    Consulted and Agree with Plan of Care Patient             Patient will benefit from skilled therapeutic intervention in order to improve the following deficits and impairments:  Decreased endurance, Decreased mobility, Pain, Postural dysfunction, Decreased strength, Decreased activity tolerance, Decreased range of motion, Hypomobility  Visit Diagnosis: Chronic right shoulder pain     Problem List Patient Active Problem List   Diagnosis Date Noted   Right shoulder pain 01/30/2021   Primary hypertension 01/30/2021   Non-seasonal allergic rhinitis 02/14/2020   Chest pain, non-cardiac 01/07/2020   Pulmonary nodules 01/07/2020   Essential hypertension 12/09/2019   Elevated serum creatinine 11/27/2019   Elevated BP without diagnosis of hypertension 11/27/2019   Breast pain, left 11/27/2019   Rectal bleeding 11/04/2019   Gastroesophageal reflux disease 11/04/2019   Chest pain 11/04/2019   SOB (shortness of breath) 11/04/2019   BMI 30.0-30.9,adult 11/04/2019   Encounter for HCV screening test for low risk patient 11/04/2019   Screening  for HIV (human immunodeficiency virus) 11/04/2019   Encounter for screening and preventative care 11/04/2019   Tobacco chew use 11/04/2019   01/04/2020 DPT Hilda Lias, PT 03/01/2021, 10:43 AM   [facility] [address], [place] Phone: [phone]   Fax:  [phone]  Name: Jurgen Groeneveld MRN: [name] Date of Birth: September 01, 1960

## 2021-03-06 ENCOUNTER — Encounter: Payer: 59 | Admitting: Physical Therapy

## 2021-03-06 ENCOUNTER — Encounter: Payer: 59 | Admitting: Internal Medicine

## 2021-03-13 ENCOUNTER — Telehealth: Payer: Self-pay | Admitting: Cardiology

## 2021-03-13 NOTE — Telephone Encounter (Signed)
° °  Pre-operative Risk Assessment    Patient Name: Craig Finley  DOB: Dec 04, 1960 MRN: [id number]      Request for Surgical Clearance    Procedure:   Major Orthopedic  procedure  Date of Surgery:  Clearance TBD                                 Surgeon:  not noted  Surgeon's Group or Practice Name:  [facility] Phone number:  [phone] Fax number:  [phone]   Type of Clearance Requested:   - Medical    Type of Anesthesia:  Not Indicated   Additional requests/questions: none   Boykin Reaper   03/13/2021, 8:23 AM

## 2021-03-13 NOTE — Telephone Encounter (Signed)
Covering Stafff, Please get type of surgery and anesthesia.

## 2021-03-13 NOTE — Telephone Encounter (Signed)
PROCEDURE: right shoulder arthroscopy with rotator cuff repair  ANESTHESIA: PREFER REGIONAL NERVE BLOCKS-WHEN POSSIBLE

## 2021-03-13 NOTE — Telephone Encounter (Signed)
Left message for pt to call the office and schedule an appt for pre op clearance. Pt can see Dr. Sandie Ano or APP.

## 2021-03-13 NOTE — Telephone Encounter (Signed)
° °  Name: Aniketh Huberty  DOB: 20-Dec-1960  MRN: [id number]  Primary Cardiologist: Debbe Odea, MD  Chart reviewed as part of pre-operative protocol coverage. Because of Lennell Shanks Belcastro's past medical history and time since last visit, he will require a follow-up visit in order to better assess preoperative cardiovascular risk.  Pre-op covering staff: - Please schedule appointment and call patient to inform them. If patient already had an upcoming appointment within acceptable timeframe, please add "pre-op clearance" to the appointment notes so provider is aware. - Please contact requesting surgeon's office via preferred method (i.e, phone, fax) to inform them of need for appointment prior to surgery.  If applicable, this message will also be routed to pharmacy pool and/or primary cardiologist for input on holding anticoagulant/antiplatelet agent as requested below so that this information is available to the clearing provider at time of patient's appointment.   [place], [place]  03/13/2021, 10:15 AM

## 2021-03-14 NOTE — Telephone Encounter (Signed)
Called patient got him scheduled for a follow up with Gillian Shields, NP at the [facility] location. Patient is agreeable with appointment but requested to be seen sooner. I advised the patient that this is the earliest appointment that we have, advised patient to contact [place] office and ask to be put on the cancellation list. He voiced understanding.

## 2021-03-22 ENCOUNTER — Ambulatory Visit (INDEPENDENT_AMBULATORY_CARE_PROVIDER_SITE_OTHER): Payer: 59 | Admitting: Internal Medicine

## 2021-03-22 ENCOUNTER — Encounter: Payer: Self-pay | Admitting: Internal Medicine

## 2021-03-22 ENCOUNTER — Other Ambulatory Visit: Payer: Self-pay

## 2021-03-22 VITALS — Ht 67.32 in | Wt 201.6 lb

## 2021-03-22 DIAGNOSIS — R7989 Other specified abnormal findings of blood chemistry: Secondary | ICD-10-CM | POA: Diagnosis not present

## 2021-03-22 DIAGNOSIS — K219 Gastro-esophageal reflux disease without esophagitis: Secondary | ICD-10-CM

## 2021-03-22 DIAGNOSIS — I10 Essential (primary) hypertension: Secondary | ICD-10-CM

## 2021-03-22 DIAGNOSIS — M75121 Complete rotator cuff tear or rupture of right shoulder, not specified as traumatic: Secondary | ICD-10-CM | POA: Insufficient documentation

## 2021-03-22 NOTE — Progress Notes (Signed)
Ht 5' 7.32" (1.71 m)    Wt 201 lb 9.6 oz (91.4 kg)    BMI 31.27 kg/m    Subjective:    Patient ID: Craig Finley, male    DOB: 06-21-1960, 60 y.o.   MRN: [id number]  No chief complaint on file.   HPI: Craig Finley is a 60 y.o. male  Gastroesophageal Reflux He complains of heartburn. He reports no abdominal pain, no belching, no chest pain, no choking, no coughing, no dysphagia, no early satiety, no globus sensation, no hoarse voice or no nausea.  Hypertension This is a chronic problem. The current episode started more than 1 month ago. The problem has been waxing and waning since onset. Pertinent negatives include no chest pain.  Shoulder Pain  This is a recurrent (sec to tear of the rotator cuff have surgery per   ortho once cleared from cardiology.) problem.   No chief complaint on file.   Relevant past medical, surgical, family and social history reviewed and updated as indicated. Interim medical history since our last visit reviewed. Allergies and medications reviewed and updated.  Review of Systems  HENT:  Negative for hoarse voice.   Respiratory:  Negative for cough and choking.   Cardiovascular:  Negative for chest pain.  Gastrointestinal:  Positive for heartburn. Negative for abdominal pain, dysphagia and nausea.   Per HPI unless specifically indicated above     Objective:    Ht 5' 7.32" (1.71 m)    Wt 201 lb 9.6 oz (91.4 kg)    BMI 31.27 kg/m   Wt Readings from Last 3 Encounters:  03/22/21 201 lb 9.6 oz (91.4 kg)  01/30/21 200 lb 6.4 oz (90.9 kg)  01/09/21 197 lb 3.2 oz (89.4 kg)    Physical Exam  Results for orders placed or performed in visit on 01/25/21  Microscopic Examination   Urine  Result Value Ref Range   WBC, UA None seen 0 - 5 /hpf   RBC 0-2 0 - 2 /hpf   Epithelial Cells (non renal) None seen 0 - 10 /hpf   Bacteria, UA None seen None seen/Few  Urinalysis, Routine w reflex microscopic  Result Value Ref Range    Specific Gravity, UA 1.020 1.005 - 1.030   pH, UA 5.5 5.0 - 7.5   Color, UA Yellow Yellow   Appearance Ur Clear Clear   Leukocytes,UA Negative Negative   Protein,UA Negative Negative/Trace   Glucose, UA Negative Negative   Ketones, UA Negative Negative   RBC, UA Trace (A) Negative   Bilirubin, UA Negative Negative   Urobilinogen, Ur 0.2 0.2 - 1.0 mg/dL   Nitrite, UA Negative Negative   Microscopic Examination See below:   Comprehensive metabolic panel  Result Value Ref Range   Glucose 95 70 - 99 mg/dL   BUN 10 6 - 24 mg/dL   Creatinine, Ser 1.28 (H) 0.76 - 1.27 mg/dL   eGFR 64 >59 mL/min/1.73   BUN/Creatinine Ratio 8 (L) 9 - 20   Sodium 144 134 - 144 mmol/L   Potassium 3.9 3.5 - 5.2 mmol/L   Chloride 104 96 - 106 mmol/L   CO2 27 20 - 29 mmol/L   Calcium 9.1 8.7 - 10.2 mg/dL   Total Protein 6.6 6.0 - 8.5 g/dL   Albumin 4.2 3.8 - 4.9 g/dL   Globulin, Total 2.4 1.5 - 4.5 g/dL   Albumin/Globulin Ratio 1.8 1.2 - 2.2   Bilirubin Total 0.4 0.0 - 1.2 mg/dL   Alkaline  Phosphatase 56 44 - 121 IU/L   AST 18 0 - 40 IU/L   ALT 22 0 - 44 IU/L  CBC with Differential/Platelet  Result Value Ref Range   WBC 6.0 3.4 - 10.8 x10E3/uL   RBC 5.32 4.14 - 5.80 x10E6/uL   Hemoglobin 15.8 13.0 - 17.7 g/dL   Hematocrit 47.3 37.5 - 51.0 %   MCV 89 79 - 97 fL   MCH 29.7 26.6 - 33.0 pg   MCHC 33.4 31.5 - 35.7 g/dL   RDW 12.7 11.6 - 15.4 %   Platelets 208 150 - 450 x10E3/uL   Neutrophils 57 Not Estab. %   Lymphs 31 Not Estab. %   Monocytes 5 Not Estab. %   Eos 6 Not Estab. %   Basos 1 Not Estab. %   Neutrophils Absolute 3.4 1.4 - 7.0 x10E3/uL   Lymphocytes Absolute 1.9 0.7 - 3.1 x10E3/uL   Monocytes Absolute 0.3 0.1 - 0.9 x10E3/uL   EOS (ABSOLUTE) 0.4 0.0 - 0.4 x10E3/uL   Basophils Absolute 0.0 0.0 - 0.2 x10E3/uL   Immature Granulocytes 0 Not Estab. %   Immature Grans (Abs) 0.0 0.0 - 0.1 x10E3/uL  Lipid panel  Result Value Ref Range   Cholesterol, Total 193 100 - 199 mg/dL   Triglycerides  130 0 - 149 mg/dL   HDL 51 >39 mg/dL   VLDL Cholesterol Cal 23 5 - 40 mg/dL   LDL Chol Calc (NIH) 119 (H) 0 - 99 mg/dL   Chol/HDL Ratio 3.8 0.0 - 5.0 ratio  PSA  Result Value Ref Range   Prostate Specific Ag, Serum 1.3 0.0 - 4.0 ng/mL  TSH  Result Value Ref Range   TSH 2.530 0.450 - 4.500 uIU/mL        Current Outpatient Medications:    Acetaminophen (TYLENOL PO), Take by mouth as needed., Disp: , Rfl:    amLODipine (NORVASC) 2.5 MG tablet, Take 1 tablet (2.5 mg total) by mouth daily., Disp: 30 tablet, Rfl: 3   Fexofenadine-Pseudoephedrine (ALLEGRA-D PO), Take 1 tablet by mouth daily as needed (allergies)., Disp: , Rfl:    meloxicam (MOBIC) 15 MG tablet, Take 1 tablet (15 mg total) by mouth daily., Disp: 30 tablet, Rfl: 0   omeprazole (PRILOSEC) 20 MG capsule, Take 1 capsule (20 mg total) by mouth daily., Disp: 30 capsule, Rfl: 3   triamcinolone (NASACORT) 55 MCG/ACT AERO nasal inhaler, Place 2 sprays into the nose daily., Disp: 1 each, Rfl: 4   oxyCODONE (OXY IR/ROXICODONE) 5 MG immediate release tablet, Take 1 tablet (5 mg total) by mouth every 4 (four) hours as needed (severe pain). (Patient not taking: Reported on 03/22/2021), Disp: 20 tablet, Rfl: 0    Assessment & Plan:  Elevated Creat :  Incresease water intake to 64 oz    GERD : is on prilosec for such  patient advised to avoid laying down soon after his meals. He took a 2 hours between dinner and bedtime. Avoid spicy food and triggers that he knows food wise that worsen his acid reflux. Patient verbalized understanding of the above. Lifestyle modifications as above discussed with patient.   HTN is on amlodipine 2.5 mg  Continue current meds.  Medication compliance emphasised. pt advised to keep Bp logs. Pt verbalised understanding of the same. Pt to have a low salt diet . Exercise to reach a goal of at least 150 mins a week.  lifestyle modifications explained and pt understands importance of the above.     BMI  high at  31.27  Lifestyle modifications advised to pt.   Portion control and avoiding high carb low fat diet advised.  Diet plan given to pt   exercise plan given and encouraged.  To increase exercise to 150 mins a week ie 21/2 hours a week. Pt verbalises understanding of the above.    Shoulder pain chronic , stable, persistent has a torn rotator cuff tear is to have surgery per   ortho once cleared from cardiology. Right shoulder arthroscopy with rotator cuff repair.  MRI right shoulder: Full-thickness rotator cuff tear involving supraspinatus, some fluid around the biceps    Problem List Items Addressed This Visit   None    No orders of the defined types were placed in this encounter.    No orders of the defined types were placed in this encounter.    Follow up plan: No follow-ups on file.

## 2021-03-23 LAB — BASIC METABOLIC PANEL
BUN/Creatinine Ratio: 11 (ref 10–24)
BUN: 14 mg/dL (ref 8–27)
CO2: 26 mmol/L (ref 20–29)
Calcium: 9.5 mg/dL (ref 8.6–10.2)
Chloride: 102 mmol/L (ref 96–106)
Creatinine, Ser: 1.32 mg/dL — ABNORMAL HIGH (ref 0.76–1.27)
Glucose: 108 mg/dL — ABNORMAL HIGH (ref 70–99)
Potassium: 5.1 mmol/L (ref 3.5–5.2)
Sodium: 139 mmol/L (ref 134–144)
eGFR: 62 mL/min/{1.73_m2} (ref >59)

## 2021-03-27 NOTE — Progress Notes (Signed)
Please let pt know creat is worse  Needs to increase water intake to 64-72 oz  thnx

## 2021-04-03 ENCOUNTER — Ambulatory Visit: Admit: 2021-04-03 | Payer: 59 | Admitting: Orthopaedic Surgery

## 2021-04-03 SURGERY — REPAIR, ROTATOR CUFF, ARTHROSCOPIC
Anesthesia: Regional | Site: Shoulder | Laterality: Right

## 2021-04-10 ENCOUNTER — Encounter: Payer: Self-pay | Admitting: Nurse Practitioner

## 2021-04-10 ENCOUNTER — Ambulatory Visit (INDEPENDENT_AMBULATORY_CARE_PROVIDER_SITE_OTHER): Payer: 59 | Admitting: Nurse Practitioner

## 2021-04-10 ENCOUNTER — Other Ambulatory Visit: Payer: Self-pay

## 2021-04-10 VITALS — BP 138/88 | HR 55 | Ht 66.75 in | Wt 206.0 lb

## 2021-04-10 DIAGNOSIS — N182 Chronic kidney disease, stage 2 (mild): Secondary | ICD-10-CM

## 2021-04-10 DIAGNOSIS — Z01810 Encounter for preprocedural cardiovascular examination: Secondary | ICD-10-CM | POA: Diagnosis not present

## 2021-04-10 DIAGNOSIS — I10 Essential (primary) hypertension: Secondary | ICD-10-CM | POA: Diagnosis not present

## 2021-04-10 MED ORDER — AMLODIPINE BESYLATE 5 MG PO TABS: 5.0000 mg | ORAL_TABLET | Freq: Every day | ORAL | 3 refills | Status: DC

## 2021-04-10 NOTE — Progress Notes (Signed)
Office Visit    Patient Name: Craig Finley Date of Encounter: 04/10/2021  Primary Care Provider:  Charlynne Cousins, MD Primary Cardiologist:  Kate Sable, MD  Chief Complaint    61 year old male with a history of chest pain and normal coronary arteries on CTA in September 2021, hypertension, heart murmur (as child), pulmonary nodules, stage II chronic kidney disease, and tobacco abuse, who presents for follow-up related for preoperative evaluation pending right shoulder arthroscopy.  Past Medical History    Past Medical History:  Diagnosis Date   Allergy    Blood in stool    Chest pain    a. 11/2019 Cor CTA: Ca2 = 0. Nl cors.   CKD (chronic kidney disease) stage 2, GFR 60-89 ml/min    Heart murmur    a. 12/2019 Echo: EF 55%, no rwma, nl RV size/fxn, Triv MR.   Hypertension    Pulmonary nodules    a. 11/2019 Cor CTA: Small L lung nodules - largest 26mm - incidental finding; b. 12/2020 Chest CT: Sm bilat pulm nodules - unchanged.   Past Surgical History:  Procedure Laterality Date   ANAL FISSURE REPAIR  05/1998   some type of anal fissure procedure   COLONOSCOPY WITH PROPOFOL N/A 01/31/2020   Procedure: COLONOSCOPY WITH PROPOFOL;  Surgeon: Lin Landsman, MD;  Location: [facility] ENDOSCOPY;  Service: Gastroenterology;  Laterality: N/A;   ESOPHAGOGASTRODUODENOSCOPY (EGD) WITH PROPOFOL N/A 01/31/2020   Procedure: ESOPHAGOGASTRODUODENOSCOPY (EGD) WITH PROPOFOL;  Surgeon: Lin Landsman, MD;  Location: [facility] ENDOSCOPY;  Service: Gastroenterology;  Laterality: N/A;    Allergies  No Known Allergies  History of Present Illness    61 y/o ? w/ the above PMH including chest pain, hypertension, heart murmur, pulmonary nodules, stage II chronic kidney disease, and tobacco abuse.  Craig Finley was initially evaluated in September 2021, following an episode of chest pain that occurred in August 2021.  His ER evaluation at that time was unremarkable.  Craig Finley subsequently underwent  coronary CT angiography which showed normal coronary arteries with a calcium score of 0.  Echocardiography in October 2020 when showed an EF of 55% without regional wall motion abnormalities, and trivial MR.  Craig Finley was last seen in cardiology clinic in October 2021, at which time Craig Finley was hypertensive and his amlodipine therapy was increased to 5 mg daily.  Craig Finley has been followed by Ortho in the setting of right shoulder pain dating back to the summer 2022 that has not improved despite NSAIDs and PT. Craig Finley is a pretty active gentleman, and was playing adult softball this Summer when Craig Finley initially injured himself.  Recent MRI of the right shoulder showed full-thickness rotator cuff tear involving the supraspinatus with some fluid around the biceps.  Craig Finley was scheduled for right shoulder arthroscopy with rotator cuff repair and advised that Craig Finley would need cardiology clearance.  From a cardiac standpoint, Craig Finley has been doing well.  Craig Finley denies chest pain, dyspnea, palpitations, PND, orthopnea, dizziness, syncope, edema, or early satiety.  Home Medications    Current Outpatient Medications  Medication Sig Dispense Refill   Acetaminophen (TYLENOL PO) Take by mouth as needed.     amLODipine (NORVASC) 5 MG tablet Take 1 tablet (5 mg total) by mouth daily. 90 tablet 3   Fexofenadine-Pseudoephedrine (ALLEGRA-D PO) Take 1 tablet by mouth daily as needed (allergies).     omeprazole (PRILOSEC) 20 MG capsule Take 1 capsule (20 mg total) by mouth daily. 30 capsule 3   triamcinolone (NASACORT) 55 MCG/ACT  AERO nasal inhaler Place 2 sprays into the nose daily. 1 each 4   No current facility-administered medications for this visit.     Review of Systems    Right shoulder pain.  Craig Finley denies chest pain, palpitations, dyspnea, pnd, orthopnea, n, v, dizziness, syncope, edema, weight gain, or early satiety.  All other systems reviewed and are otherwise negative except as noted above.    Physical Exam    VS:  BP 138/88 (BP  Location: Left Arm, Patient Position: Sitting, Cuff Size: Large)    Pulse (!) 55    Ht 5' 6.75" (1.695 m)    Wt 206 lb (93.4 kg)    SpO2 98%    BMI 32.51 kg/m  , BMI Body mass index is 32.51 kg/m.     GEN: Well nourished, well developed, in no acute distress. HEENT: normal. Neck: Supple, no JVD, carotid bruits, or masses. Cardiac: RRR, no murmurs, rubs, or gallops. No clubbing, cyanosis, edema.  Radials/PT 2+ and equal bilaterally.  Respiratory:  Respirations regular and unlabored, clear to auscultation bilaterally. GI: Soft, nontender, nondistended, BS + x 4. MS: no deformity or atrophy. Skin: warm and dry, no rash. Neuro:  Strength and sensation are intact. Psych: Normal affect.  Accessory Clinical Findings    ECG personally reviewed by me today -sinus bradycardia, 55- no acute changes.  Lab Results  Component Value Date   WBC 6.0 01/25/2021   HGB 15.8 01/25/2021   HCT 47.3 01/25/2021   MCV 89 01/25/2021   PLT 208 01/25/2021   Lab Results  Component Value Date   CREATININE 1.32 (H) 03/22/2021   BUN 14 03/22/2021   NA 139 03/22/2021   K 5.1 03/22/2021   CL 102 03/22/2021   CO2 26 03/22/2021   Lab Results  Component Value Date   ALT 22 01/25/2021   AST 18 01/25/2021   ALKPHOS 56 01/25/2021   BILITOT 0.4 01/25/2021   Lab Results  Component Value Date   CHOL 193 01/25/2021   HDL 51 01/25/2021   LDLCALC 119 (H) 01/25/2021   TRIG 130 01/25/2021   CHOLHDL 3.8 01/25/2021    Lab Results  Component Value Date   HGBA1C 5.5 11/18/2019    Assessment & Plan    1.  Preoperative cardiovascular evaluation: Patient with a history of chest pain and normal coronary arteries on cardiac CT in September 2021.  Calcium score 0 at that time.  Craig Finley also had an echocardiogram in October 2021 which showed normal LV function.  Unfortunately, Craig Finley has a full-thickness right rotator cuff tear and is now pending right shoulder arthroscopy with rotator cuff repair.  From a cardiovascular  standpoint, Craig Finley has done exceptionally well.  Craig Finley denies chest pain or dyspnea and is easily able to achieve 8 METS.  His risk for a major cardiac event in the setting of shoulder surgery is approximately 0.4%, and does not require any ischemic testing prior to proceeding to the operating room.  Craig Finley should continue antihypertensive therapy throughout the perioperative period.  2.  Essential hypertension: Blood pressure elevated at 138/88.  Craig Finley is currently on amlodipine 2.5 mg daily.  This was increased to 5 mg daily in October 2021 but patient subsequently came off of all of his medications and when amlodipine was resumed, it was resumed at the lowest dose.  Craig Finley says blood pressures at home typically are in the 130s and 140s.  In that setting, I will increase him back to 5 mg daily.  3.  Stage II chronic kidney disease: Creatinine 1.32 in December.  4.  Pulmonary nodules: Craig Finley had stable pulmonary nodules on October 2022 chest CT.  5.  Disposition: Follow-up in 6 months or sooner as needed.  Murray Hodgkins, NP 04/10/2021, 5:09 PM

## 2021-04-10 NOTE — Patient Instructions (Signed)
Medication Instructions:  - Your physician has recommended you make the following change in your medication:   1) INCREASE Amlodipine to 5 mg: - take 1 tablet (5 mg) by mouth ONCE daily   *If you need a refill on your cardiac medications before your next appointment, please call your pharmacy*   Lab Work: - none ordered  If you have labs (blood work) drawn today and your tests are completely normal, you will receive your results only by: MyChart Message (if you have MyChart) OR A paper copy in the mail If you have any lab test that is abnormal or we need to change your treatment, we will call you to review the results.   Testing/Procedures: - none ordered   Follow-Up: At [facility], you and your health needs are our priority.  As part of our continuing mission to provide you with exceptional heart care, we have created designated Provider Care Teams.  These Care Teams include your primary Cardiologist (physician) and Advanced Practice Providers (APPs -  Physician Assistants and Nurse Practitioners) who all work together to provide you with the care you need, when you need it.  We recommend signing up for the patient portal called "MyChart".  Sign up information is provided on this After Visit Summary.  MyChart is used to connect with patients for Virtual Visits (Telemedicine).  Patients are able to view lab/test results, encounter notes, upcoming appointments, etc.  Non-urgent messages can be sent to your provider as well.   To learn more about what you can do with MyChart, go to [email or web address].    Your next appointment:   As needed  The format for your next appointment:   In Person  Provider:   You may see Debbe Odea, MD or one of the following Advanced Practice Providers on your designated Care Team:   Nicolasa Ducking, NP  Other Instructions N/a

## 2021-04-17 ENCOUNTER — Ambulatory Visit (HOSPITAL_BASED_OUTPATIENT_CLINIC_OR_DEPARTMENT_OTHER): Payer: 59 | Admitting: Family

## 2021-04-20 ENCOUNTER — Other Ambulatory Visit: Payer: Self-pay

## 2021-04-20 ENCOUNTER — Encounter (HOSPITAL_COMMUNITY): Payer: Self-pay | Admitting: Orthopaedic Surgery

## 2021-04-20 NOTE — Progress Notes (Signed)
Craig Finley denies chest pain or shortness of breath. Patient denies having any s/s of Covid in his household.  Patient denies any known exposure to Covid.    PCP is Dr. Loura Pardon. I instructed Craig Finley to [employer] until after surgery.  I instructed [name] shower with antibiotic soap, if it is available.  Dry off with a clean towel. Do not put lotion, powder, cologne or deodorant or makeup.No jewelry or piercings. Men may shave their face and neck. Woman should not shave. No nail polish, artificial or acrylic nails. Wear clean clothes, brush your teeth.Glasses, contact lens,dentures or partials may not be worn in the OR. If you need to wear them, please bring a case for glasses, do not wear contacts or bring a case, the hospital does not have contact cases, dentures or partials will have to be removed , make sure they are clean, we will provide a denture cup to put them in. You will need some one to drive you home and a responsible person over the age of 47 to stay with you for the first 24 hours after surgery.

## 2021-04-23 ENCOUNTER — Ambulatory Visit (HOSPITAL_COMMUNITY): Payer: 59 | Admitting: Anesthesiology

## 2021-04-23 ENCOUNTER — Other Ambulatory Visit: Payer: Self-pay

## 2021-04-23 ENCOUNTER — Ambulatory Visit (HOSPITAL_COMMUNITY)
Admission: RE | Admit: 2021-04-23 | Discharge: 2021-04-23 | Disposition: A | Discharge status: Home / Self Care | Payer: 59 | Attending: Orthopaedic Surgery | Admitting: Orthopaedic Surgery

## 2021-04-23 ENCOUNTER — Encounter (HOSPITAL_COMMUNITY)
Admission: RE | Disposition: A | Discharge status: Home / Self Care | Payer: Self-pay | Source: Home / Self Care | Attending: Orthopaedic Surgery

## 2021-04-23 ENCOUNTER — Encounter (HOSPITAL_COMMUNITY): Payer: Self-pay | Admitting: Orthopaedic Surgery

## 2021-04-23 DIAGNOSIS — M75121 Complete rotator cuff tear or rupture of right shoulder, not specified as traumatic: Secondary | ICD-10-CM | POA: Diagnosis not present

## 2021-04-23 DIAGNOSIS — S46011A Strain of muscle(s) and tendon(s) of the rotator cuff of right shoulder, initial encounter: Secondary | ICD-10-CM | POA: Diagnosis not present

## 2021-04-23 DIAGNOSIS — Z79899 Other long term (current) drug therapy: Secondary | ICD-10-CM | POA: Diagnosis not present

## 2021-04-23 DIAGNOSIS — E669 Obesity, unspecified: Secondary | ICD-10-CM | POA: Diagnosis not present

## 2021-04-23 DIAGNOSIS — M7551 Bursitis of right shoulder: Secondary | ICD-10-CM | POA: Diagnosis not present

## 2021-04-23 DIAGNOSIS — Z6832 Body mass index (BMI) 32.0-32.9, adult: Secondary | ICD-10-CM | POA: Diagnosis not present

## 2021-04-23 DIAGNOSIS — K219 Gastro-esophageal reflux disease without esophagitis: Secondary | ICD-10-CM | POA: Insufficient documentation

## 2021-04-23 DIAGNOSIS — I10 Essential (primary) hypertension: Secondary | ICD-10-CM | POA: Diagnosis not present

## 2021-04-23 HISTORY — PX: ARTHOSCOPIC ROTAOR CUFF REPAIR: SHX5002

## 2021-04-23 HISTORY — DX: Gastro-esophageal reflux disease without esophagitis: K21.9

## 2021-04-23 LAB — COMPREHENSIVE METABOLIC PANEL
ALT: 31 U/L (ref 0–44)
AST: 48 U/L — ABNORMAL HIGH (ref 15–41)
Albumin: 4.2 g/dL (ref 3.5–5.0)
Alkaline Phosphatase: 43 U/L (ref 38–126)
Anion gap: 11 (ref 5–15)
BUN: 11 mg/dL (ref 6–20)
CO2: 23 mmol/L (ref 22–32)
Calcium: 9.1 mg/dL (ref 8.9–10.3)
Chloride: 103 mmol/L (ref 98–111)
Creatinine, Ser: 1.21 mg/dL (ref 0.61–1.24)
GFR, Estimated: >60 mL/min (ref >60)
Glucose, Bld: 111 mg/dL — ABNORMAL HIGH (ref 70–99)
Potassium: 4.6 mmol/L (ref 3.5–5.1)
Sodium: 137 mmol/L (ref 135–145)
Total Bilirubin: 3.1 mg/dL — ABNORMAL HIGH (ref 0.3–1.2)
Total Protein: 7.3 g/dL (ref 6.5–8.1)

## 2021-04-23 LAB — CBC
HCT: 48.5 % (ref 39.0–52.0)
Hemoglobin: 16.5 g/dL (ref 13.0–17.0)
MCH: 30.2 pg (ref 26.0–34.0)
MCHC: 34 g/dL (ref 30.0–36.0)
MCV: 88.7 fL (ref 80.0–100.0)
Platelets: 218 10*3/uL (ref 150–400)
RBC: 5.47 MIL/uL (ref 4.22–5.81)
RDW: 11.9 % (ref 11.5–15.5)
WBC: 5.5 10*3/uL (ref 4.0–10.5)
nRBC: 0 % (ref 0.0–0.2)

## 2021-04-23 SURGERY — REPAIR, ROTATOR CUFF, ARTHROSCOPIC
Anesthesia: Regional | Site: Shoulder | Laterality: Right

## 2021-04-23 MED ORDER — SUGAMMADEX SODIUM 200 MG/2ML IV SOLN
INTRAVENOUS | Status: DC | PRN
Start: 2021-04-23 — End: 2021-04-23
  Administered 2021-04-23: 200 mg via INTRAVENOUS

## 2021-04-23 MED ORDER — TRANEXAMIC ACID-NACL 1000-0.7 MG/100ML-% IV SOLN
INTRAVENOUS | Status: AC
  Filled 2021-04-23: qty 100

## 2021-04-23 MED ORDER — ROCURONIUM BROMIDE 10 MG/ML (PF) SYRINGE
PREFILLED_SYRINGE | INTRAVENOUS | Status: DC | PRN
  Administered 2021-04-23: 20 mg via INTRAVENOUS
  Administered 2021-04-23: 60 mg via INTRAVENOUS

## 2021-04-23 MED ORDER — FENTANYL CITRATE (PF) 100 MCG/2ML IJ SOLN: 25.0000 ug | INTRAMUSCULAR | Status: DC | PRN

## 2021-04-23 MED ORDER — CHLORHEXIDINE GLUCONATE 0.12 % MT SOLN
OROMUCOSAL | Status: AC
  Administered 2021-04-23: 15 mL via OROMUCOSAL
  Filled 2021-04-23: qty 15

## 2021-04-23 MED ORDER — LACTATED RINGERS IV SOLN: INTRAVENOUS | Status: DC

## 2021-04-23 MED ORDER — ONDANSETRON HCL 4 MG/2ML IJ SOLN
INTRAMUSCULAR | Status: AC
  Filled 2021-04-23: qty 2

## 2021-04-23 MED ORDER — EPINEPHRINE PF 1 MG/ML IJ SOLN
INTRAMUSCULAR | Status: AC
  Filled 2021-04-23: qty 2

## 2021-04-23 MED ORDER — PHENYLEPHRINE 40 MCG/ML (10ML) SYRINGE FOR IV PUSH (FOR BLOOD PRESSURE SUPPORT)
PREFILLED_SYRINGE | INTRAVENOUS | Status: AC
  Filled 2021-04-23: qty 10

## 2021-04-23 MED ORDER — GABAPENTIN 300 MG PO CAPS
ORAL_CAPSULE | ORAL | Status: AC
  Administered 2021-04-23: 300 mg via ORAL
  Filled 2021-04-23: qty 1

## 2021-04-23 MED ORDER — FENTANYL CITRATE (PF) 100 MCG/2ML IJ SOLN: 50.0000 ug | Freq: Once | INTRAMUSCULAR | Status: AC

## 2021-04-23 MED ORDER — EPINEPHRINE PF 1 MG/ML IJ SOLN
INTRAMUSCULAR | Status: DC | PRN
  Administered 2021-04-23: 2 mL

## 2021-04-23 MED ORDER — DEXAMETHASONE SODIUM PHOSPHATE 10 MG/ML IJ SOLN
INTRAMUSCULAR | Status: AC
  Filled 2021-04-23: qty 1

## 2021-04-23 MED ORDER — CEFAZOLIN SODIUM-DEXTROSE 2-4 GM/100ML-% IV SOLN
2.0000 g | INTRAVENOUS | Status: AC
  Administered 2021-04-23: 2 g via INTRAVENOUS

## 2021-04-23 MED ORDER — EPHEDRINE 5 MG/ML INJ
INTRAVENOUS | Status: AC
  Filled 2021-04-23: qty 5

## 2021-04-23 MED ORDER — GABAPENTIN 300 MG PO CAPS: 300.0000 mg | ORAL_CAPSULE | Freq: Once | ORAL | Status: AC

## 2021-04-23 MED ORDER — CEFAZOLIN SODIUM-DEXTROSE 2-4 GM/100ML-% IV SOLN
INTRAVENOUS | Status: AC
  Filled 2021-04-23: qty 100

## 2021-04-23 MED ORDER — EPHEDRINE SULFATE-NACL 50-0.9 MG/10ML-% IV SOSY
PREFILLED_SYRINGE | INTRAVENOUS | Status: DC | PRN
  Administered 2021-04-23 (×3): 5 mg via INTRAVENOUS

## 2021-04-23 MED ORDER — MIDAZOLAM HCL 2 MG/2ML IJ SOLN
INTRAMUSCULAR | Status: AC
  Administered 2021-04-23: 2 mg via INTRAVENOUS
  Filled 2021-04-23: qty 2

## 2021-04-23 MED ORDER — PROPOFOL 10 MG/ML IV BOLUS
INTRAVENOUS | Status: AC
  Filled 2021-04-23: qty 20

## 2021-04-23 MED ORDER — BUPIVACAINE HCL (PF) 0.5 % IJ SOLN
INTRAMUSCULAR | Status: DC | PRN
  Administered 2021-04-23: 15 mL via PERINEURAL

## 2021-04-23 MED ORDER — SODIUM CHLORIDE 0.9 % IR SOLN
Status: DC | PRN
  Administered 2021-04-23: 6000 mL
  Administered 2021-04-23 (×2): 3000 mL

## 2021-04-23 MED ORDER — ACETAMINOPHEN 500 MG PO TABS: 1000.0000 mg | ORAL_TABLET | Freq: Once | ORAL | Status: AC

## 2021-04-23 MED ORDER — ORAL CARE MOUTH RINSE: 15.0000 mL | Freq: Once | OROMUCOSAL | Status: AC

## 2021-04-23 MED ORDER — 0.9 % SODIUM CHLORIDE (POUR BTL) OPTIME
TOPICAL | Status: DC | PRN
  Administered 2021-04-23: 1000 mL

## 2021-04-23 MED ORDER — ONDANSETRON HCL 4 MG/2ML IJ SOLN
INTRAMUSCULAR | Status: DC | PRN
  Administered 2021-04-23: 4 mg via INTRAVENOUS

## 2021-04-23 MED ORDER — CHLORHEXIDINE GLUCONATE 0.12 % MT SOLN: 15.0000 mL | Freq: Once | OROMUCOSAL | Status: AC

## 2021-04-23 MED ORDER — MIDAZOLAM HCL 2 MG/2ML IJ SOLN
2.0000 mg | Freq: Once | INTRAMUSCULAR | Status: AC
Start: 2021-04-23 — End: 2021-04-23

## 2021-04-23 MED ORDER — PHENYLEPHRINE 40 MCG/ML (10ML) SYRINGE FOR IV PUSH (FOR BLOOD PRESSURE SUPPORT)
PREFILLED_SYRINGE | INTRAVENOUS | Status: DC | PRN
Start: 2021-04-23 — End: 2021-04-23
  Administered 2021-04-23: 120 ug via INTRAVENOUS
  Administered 2021-04-23: 80 ug via INTRAVENOUS
  Administered 2021-04-23: 120 ug via INTRAVENOUS

## 2021-04-23 MED ORDER — FENTANYL CITRATE (PF) 100 MCG/2ML IJ SOLN
INTRAMUSCULAR | Status: AC
  Administered 2021-04-23: 50 ug via INTRAVENOUS
  Filled 2021-04-23: qty 2

## 2021-04-23 MED ORDER — PHENYLEPHRINE HCL-NACL 20-0.9 MG/250ML-% IV SOLN
INTRAVENOUS | Status: DC | PRN
  Administered 2021-04-23: 50 ug/min via INTRAVENOUS

## 2021-04-23 MED ORDER — TRANEXAMIC ACID-NACL 1000-0.7 MG/100ML-% IV SOLN
1000.0000 mg | INTRAVENOUS | Status: AC
  Administered 2021-04-23: 1000 mg via INTRAVENOUS

## 2021-04-23 MED ORDER — PROPOFOL 10 MG/ML IV BOLUS
INTRAVENOUS | Status: DC | PRN
  Administered 2021-04-23: 100 mg via INTRAVENOUS

## 2021-04-23 MED ORDER — FENTANYL CITRATE (PF) 250 MCG/5ML IJ SOLN
INTRAMUSCULAR | Status: AC
  Filled 2021-04-23: qty 5

## 2021-04-23 MED ORDER — MIDAZOLAM HCL 2 MG/2ML IJ SOLN
INTRAMUSCULAR | Status: AC
  Filled 2021-04-23: qty 2

## 2021-04-23 MED ORDER — PROMETHAZINE HCL 25 MG/ML IJ SOLN: 6.2500 mg | INTRAMUSCULAR | Status: DC | PRN

## 2021-04-23 MED ORDER — BUPIVACAINE LIPOSOME 1.3 % IJ SUSP
INTRAMUSCULAR | Status: DC | PRN
  Administered 2021-04-23: 10 mL via PERINEURAL

## 2021-04-23 MED ORDER — ACETAMINOPHEN 500 MG PO TABS
ORAL_TABLET | ORAL | Status: AC
  Administered 2021-04-23: 1000 mg via ORAL
  Filled 2021-04-23: qty 2

## 2021-04-23 MED ORDER — BUPIVACAINE HCL (PF) 0.25 % IJ SOLN
INTRAMUSCULAR | Status: AC
  Filled 2021-04-23: qty 30

## 2021-04-23 MED ORDER — LIDOCAINE 2% (20 MG/ML) 5 ML SYRINGE
INTRAMUSCULAR | Status: DC | PRN
  Administered 2021-04-23: 60 mg via INTRAVENOUS

## 2021-04-23 MED ORDER — DEXAMETHASONE SODIUM PHOSPHATE 10 MG/ML IJ SOLN
INTRAMUSCULAR | Status: DC | PRN
  Administered 2021-04-23: 5 mg via INTRAVENOUS

## 2021-04-23 MED ORDER — FENTANYL CITRATE (PF) 250 MCG/5ML IJ SOLN
INTRAMUSCULAR | Status: DC | PRN
  Administered 2021-04-23: 150 ug via INTRAVENOUS

## 2021-04-23 SURGICAL SUPPLY — 65 items
AID PSTN UNV HD RSTRNT DISP (MISCELLANEOUS) ×1
ANCH SUT 2 1.5X2.9 2 LD (Anchor) ×1 IMPLANT
ANCH SUT 4.75 SWIVELOCK ANCH (Orthopedic Implant) ×1 IMPLANT
ANCH SUT KNTLS STRL SHLDR SYS (Anchor) ×1 IMPLANT
ANCHOR JUGGERKNOT 2X2.9 WH/BL (Anchor) ×1 IMPLANT
ANCHOR SUT QUATTRO KNTLS 4.5 (Anchor) ×1 IMPLANT
APL PRP STRL LF DISP 70% ISPRP (MISCELLANEOUS) ×2
BAG COUNTER SPONGE SURGICOUNT (BAG) ×2 IMPLANT
BAG SPNG CNTER NS LX DISP (BAG) ×1
BLADE CUTTER GATOR 3.5 (BLADE) IMPLANT
BLADE EXCALIBUR 4.0X13 (MISCELLANEOUS) ×1 IMPLANT
BLADE SURG 11 STRL SS (BLADE) IMPLANT
CHLORAPREP W/TINT 26 (MISCELLANEOUS) ×3 IMPLANT
COOLER ICEMAN CLASSIC (MISCELLANEOUS) ×1 IMPLANT
COVER SURGICAL LIGHT HANDLE (MISCELLANEOUS) ×2 IMPLANT
CUTTER SUT JUGGER STITCH CU (CUTTER) ×1 IMPLANT
DRAPE ARTHROSCOPY W/POUCH 114 (DRAPES) ×1 IMPLANT
DRAPE EXTREMITY T 121X128X90 (DISPOSABLE) ×2 IMPLANT
DRAPE HALF SHEET 40X57 (DRAPES) ×2 IMPLANT
DRAPE INCISE IOBAN 66X45 STRL (DRAPES) ×4 IMPLANT
DRAPE STERI 35X30 U-POUCH (DRAPES) ×2 IMPLANT
DRAPE U-SHAPE 47X51 STRL (DRAPES) ×4 IMPLANT
DRSG PAD ABDOMINAL 8X10 ST (GAUZE/BANDAGES/DRESSINGS) ×1 IMPLANT
DRSG XEROFORM 1X8 (GAUZE/BANDAGES/DRESSINGS) ×1 IMPLANT
ELECT REM PT RETURN 9FT ADLT (ELECTROSURGICAL) ×2
ELECTRODE REM PT RTRN 9FT ADLT (ELECTROSURGICAL) ×1 IMPLANT
FILTER STRAW FLUID ASPIR (MISCELLANEOUS) ×2 IMPLANT
GAUZE SPONGE 4X4 12PLY STRL (GAUZE/BANDAGES/DRESSINGS) ×1 IMPLANT
GLOVE SRG 8 PF TXTR STRL LF DI (GLOVE) ×1 IMPLANT
GLOVE SURG ENC MOIS LTX SZ6 (GLOVE) ×4 IMPLANT
GLOVE SURG LTX SZ8 (GLOVE) ×4 IMPLANT
GLOVE SURG UNDER POLY LF SZ6.5 (GLOVE) ×2 IMPLANT
GLOVE SURG UNDER POLY LF SZ8 (GLOVE) ×2
GOWN STRL REUS W/ TWL LRG LVL3 (GOWN DISPOSABLE) ×2 IMPLANT
GOWN STRL REUS W/TWL LRG LVL3 (GOWN DISPOSABLE) ×4
KIT BASIN OR (CUSTOM PROCEDURE TRAY) ×2 IMPLANT
KIT JUGGERKNOT DISP 2.9MM (KITS) ×1 IMPLANT
KIT TURNOVER KIT B (KITS) ×2 IMPLANT
MANIFOLD NEPTUNE II (INSTRUMENTS) ×2 IMPLANT
NDL HYPO 25X1 1.5 SAFETY (NEEDLE) ×1 IMPLANT
NDL SCORPION MULTI FIRE (NEEDLE) IMPLANT
NDL SPNL 18GX3.5 QUINCKE PK (NEEDLE) ×1 IMPLANT
NDL SUT 6 .5 CRC .975X.05 MAYO (NEEDLE) ×1 IMPLANT
NDL SUT PASSER RTC (NEEDLE) IMPLANT
NEEDLE HYPO 25X1 1.5 SAFETY (NEEDLE) ×2 IMPLANT
NEEDLE MAYO TAPER (NEEDLE) ×2
NEEDLE SCORPION MULTI FIRE (NEEDLE) IMPLANT
NEEDLE SPNL 18GX3.5 QUINCKE PK (NEEDLE) ×2 IMPLANT
NEEDLE SUT PASSER RTC (NEEDLE) ×2 IMPLANT
NS IRRIG 1000ML POUR BTL (IV SOLUTION) ×2 IMPLANT
PACK SHOULDER (CUSTOM PROCEDURE TRAY) ×2 IMPLANT
PAD ARMBOARD 7.5X6 YLW CONV (MISCELLANEOUS) ×4 IMPLANT
PORT APPOLLO RF 90DEGREE MULTI (SURGICAL WAND) ×1 IMPLANT
RESTRAINT HEAD UNIVERSAL NS (MISCELLANEOUS) ×2 IMPLANT
SLING ARM IMMOBILIZER LRG (SOFTGOODS) IMPLANT
SPONGE T-LAP 4X18 ~~LOC~~+RFID (SPONGE) ×2 IMPLANT
SUCTION FRAZIER HANDLE 10FR (MISCELLANEOUS) ×2
SUCTION TUBE FRAZIER 10FR DISP (MISCELLANEOUS) ×1 IMPLANT
SYR 20ML LL LF (SYRINGE) ×4 IMPLANT
SYR 30ML LL (SYRINGE) ×2 IMPLANT
SYSTEM IMPL TENODESIS LNT 4.75 (Orthopedic Implant) ×1 IMPLANT
TOWEL GREEN STERILE (TOWEL DISPOSABLE) ×2 IMPLANT
TOWEL GREEN STERILE FF (TOWEL DISPOSABLE) ×2 IMPLANT
TUBE CONNECTING 12X1/4 (SUCTIONS) ×2 IMPLANT
TUBING ARTHROSCOPY IRRIG 16FT (MISCELLANEOUS) ×2 IMPLANT

## 2021-04-23 NOTE — H&P (Signed)
Chief Complaint: right shoulder pain        History of Present Illness:    03/01/2021: Presents today for follow-up of his right shoulder MRI.  He has been doing physical therapy and taking anti-inflammatories with very minimal relief.  He is here today with continued pain and inability to sleep.  This is affecting basic activities of daily living     Craig Finley is a 61 y.o. male RHD male with right shoulder pain since August 2022.  He was throwing a ball in his softball league and subsequently felt pain.  Since that time he has significant pain and overhead weakness.  He is hardly able to lift the shoulder overhead.  He has been taking Mobic daily which does not significantly help.  He has been doing physical therapy with some limited relief although this is overall not improving his motion.  Has significant pain with laying on the shoulder.  He works as a Administrator.       Surgical History:   None   PMH/PSH/Family History/Social History/Meds/Allergies:         Past Medical History:  Diagnosis Date   Allergy     Blood in stool     Heart murmur      as child   Hypertension           Past Surgical History:  Procedure Laterality Date   ANAL FISSURE REPAIR   05/1998    some type of anal fissure procedure   COLONOSCOPY WITH PROPOFOL N/A 01/31/2020    Procedure: COLONOSCOPY WITH PROPOFOL;  Surgeon: Lin Landsman, MD;  Location: [facility] ENDOSCOPY;  Service: Gastroenterology;  Laterality: N/A;   ESOPHAGOGASTRODUODENOSCOPY (EGD) WITH PROPOFOL N/A 01/31/2020    Procedure: ESOPHAGOGASTRODUODENOSCOPY (EGD) WITH PROPOFOL;  Surgeon: Lin Landsman, MD;  Location: [facility] ENDOSCOPY;  Service: Gastroenterology;  Laterality: N/A;    Social History         Socioeconomic History   Marital status: Married      Spouse name: Not on file   Number of children: Not on file   Years of education: Not on file   Highest education level: Some college, no  degree  Occupational History   Occupation: trucker  Tobacco Use   Smoking status: Never   Smokeless tobacco: Current      Types: Snuff   Tobacco comments:      dipping x 25-30 years  Vaping Use   Vaping Use: Never used  Substance and Sexual Activity   Alcohol use: Yes      Comment: drinks 4-7 beers some days and none others. He drinks 24 12 oz can  beers per    Drug use: Never   Sexual activity: Yes  Other Topics Concern   Not on file  Social History Narrative    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     Social Determinants of Health    Financial Resource Strain: Not on file  Food Insecurity: Not on file  Transportation Needs: Not on file  Physical Activity: Not on file  Stress: Not on file  Social Connections: Not on file         Family History  Problem Relation Age of Onset   Heart disease Maternal Uncle     Heart disease Maternal Uncle     Arthritis Mother     Asthma Mother     Hypertension Mother     Hearing loss Father  Alcohol abuse Sister     Cancer Sister     Depression Sister     Drug abuse Sister     Early death Sister     Heart attack Sister     Asthma Daughter     Asthma Son     Alcohol abuse Maternal Grandmother     Cancer Maternal Grandmother     Cancer Maternal Grandfather     Hypertension Sister     Miscarriages / Stillbirths Sister     Asthma Sister     Depression Sister      No Known Allergies       Current Outpatient Medications  Medication Sig Dispense Refill   Acetaminophen (TYLENOL PO) Take by mouth as needed.       amLODipine (NORVASC) 2.5 MG tablet Take 1 tablet (2.5 mg total) by mouth daily. 30 tablet 3   aspirin EC 81 MG tablet Take 81 mg by mouth daily. Swallow whole.       Fexofenadine-Pseudoephedrine (ALLEGRA-D PO) Take 1 tablet by mouth daily as needed (allergies).       meloxicam (MOBIC) 15 MG tablet Take 1 tablet (15 mg total) by mouth daily. 30 tablet 0   omeprazole (PRILOSEC) 20 MG capsule Take 1 capsule  (20 mg total) by mouth daily. 30 capsule 3   triamcinolone (NASACORT) 55 MCG/ACT AERO nasal inhaler Place 2 sprays into the nose daily. 1 each 4    No current facility-administered medications for this visit.     Imaging Results (Last 48 hours)  MR Shoulder Right Wo Contrast   Result Date: 03/01/2021 CLINICAL DATA:  Right shoulder pain with weakness and limited range of motion due to a softball injury 10/2020. Assess for labral tear. EXAM: MRI OF THE RIGHT SHOULDER WITHOUT CONTRAST TECHNIQUE: Multiplanar, multisequence MR imaging of the shoulder was performed. No intravenous contrast was administered. COMPARISON:  X-ray shoulder 01/09/2021. FINDINGS: Rotator cuff: Full-thickness, nearly full-width tear of the distal supraspinatus tendon anteriorly with 6 mm of tendon retraction. Tear measures 16 mm in AP dimension. High-grade articular sided tears of the posterior infraspinatus tendon insertion. Additional interstitial tear of the infraspinatus tendon extending to the myotendinous junction. Background of mild-moderate rotator cuff tendinosis. Subscapularis and teres minor tendons intact. Muscles: Preserved bulk and signal intensity of the rotator cuff musculature without edema, atrophy, or fatty infiltration. Biceps long head: Intact and normally positioned. Acromioclavicular Joint: Moderate arthropathy of the AC joint. Small volume subacromial-subdeltoid bursal fluid communicating with the glenohumeral joint. Glenohumeral Joint: Mild glenohumeral chondral thinning. Trace joint effusion. Labrum:  Grossly intact.  No paralabral cyst. Bones:  No acute fracture.  No dislocation.  No focal bone lesion. Other: None. IMPRESSION: 1. Full-thickness, nearly full-width tear of the distal supraspinatus tendon with mild retraction. 2. High-grade articular sided tears of the posterior infraspinatus tendon insertion. Additional interstitial tear of the infraspinatus tendon extending to the myotendinous junction. 3.  Moderate acromioclavicular and mild glenohumeral joint osteoarthritis. Electronically Signed   By: Davina Poke D.O.   On: 03/01/2021 10:53       Review of Systems:   A ROS was performed including pertinent positives and negatives as documented in the HPI.   Physical Exam :   Constitutional: NAD and appears stated age Neurological: Alert and oriented Psych: Appropriate affect and cooperative There were no vitals taken for this visit.    Comprehensive Musculoskeletal Exam:     Musculoskeletal Exam      Inspection Right Left  Skin No atrophy  or winging No atrophy or winging  Palpation      Tenderness Glenohumeral as well as lateral deltoid None  Range of Motion      Flexion (passive) 100 with pain 170  Flexion (active) 90 with pain 170  Abduction 90 with pain 170  ER at the side 30 70  Can reach behind back to Back pocket T12  Strength        4/5 supra and infraspinatus with pain Normal  Special Tests      Pseudoparalytic No No  Neurologic      Fires PIN, radial, median, ulnar, musculocutaneous, axillary, suprascapular, long thoracic, and spinal accessory innervated muscles. No abnormal sensibility  Vascular/Lymphatic      Radial Pulse 2+ 2+  Cervical Exam      Patient has symmetric cervical range of motion with negative Spurling's test.  Special Test: Positive Spurling positive Neer    Nontender about his biceps   Imaging:   Xray (3 views right shoulder): There is mild inferior humeral osteophyte although overall well-preserved joint space.  Minor AC joint osteoarthritis   MRI right shoulder: Full-thickness rotator cuff tear involving supraspinatus, some fluid around the biceps     I personally reviewed and interpreted the radiographs.     Assessment:   61 year old male right-hand-dominant with right shoulder pain after a softball injury in August 2022.  Since that time he has failed multiple nonoperative treatments including activity restriction, taking Mobic,  physical therapy.  He sponsors multiple softball teams and is very interested in actively playing softball which has been completely curtailed as result of this injury.  He continues to have minimal relief with physical therapy.  At this time I would like to plan for right shoulder arthroscopic rotator cuff repair.  I do not believe that a biceps tenodesis will likely be necessary but ultimately I will plan to assess the biceps tendon at the time of arthroscopic surgery.  I described the risks and benefits to him and he would like to proceed with this   Plan :     -Plan for right shoulder arthroscopy with rotator cuff repair       After a lengthy discussion of treatment options, including risks, benefits, alternatives, complications of surgical and nonsurgical conservative options, the patient elected surgical repair.    The patient  is aware of the material risks  and complications including, but not limited to injury to adjacent structures, neurovascular injury, infection, numbness, bleeding, implant failure, thermal burns, stiffness, persistent pain, failure to heal, disease transmission from allograft, need for further surgery, dislocation, anesthetic risks, blood clots, risks of death,and others. The probabilities of surgical success and failure discussed with patient given their particular co-morbidities.The time and nature of expected rehabilitation and recovery was discussed.The patient's questions were all answered preoperatively.  No barriers to understanding were noted. I explained the natural history of the disease process and Rx rationale.  I explained to the patient what I considered to be reasonable expectations given their personal situation.  The final treatment plan was arrived at through a shared patient decision making process model.     Patient was prescribed a shoulder immobilizer today for the diagnosis listed above under assessment. The patient requires stabilization from this  orthosis in their right arm.

## 2021-04-23 NOTE — Anesthesia Procedure Notes (Signed)
Anesthesia Regional Block: Interscalene brachial plexus block   Pre-Anesthetic Checklist: , timeout performed,  Correct Patient, Correct Site, Correct Laterality,  Correct Procedure, Correct Position, site marked,  Risks and benefits discussed,  Surgical consent,  Pre-op evaluation,  At surgeon's request and post-op pain management  Laterality: Right  Prep: Dura Prep       Needles:  Injection technique: Single-shot  Needle Type: Echogenic Stimulator Needle     Needle Length: 5cm  Needle Gauge: 20     Additional Needles:   Procedures:,,,, ultrasound used (permanent image in chart),,    Narrative:  Start time: 04/23/2021 11:20 AM End time: 04/23/2021 11:24 AM Injection made incrementally with aspirations every 5 mL.  Performed by: Personally  Anesthesiologist: Darral Dash, DO  Additional Notes: Patient identified. Risks/Benefits/Options discussed with patient including but not limited to bleeding, infection, nerve damage, failed block, incomplete pain control. Patient expressed understanding and wished to proceed. All questions were answered. Sterile technique was used throughout the entire procedure. Please see nursing notes for vital signs. Aspirated in 5cc intervals with injection for negative confirmation. Patient was given instructions on fall risk and not to get out of bed. All questions and concerns addressed with instructions to call with any issues or inadequate analgesia.

## 2021-04-23 NOTE — Anesthesia Preprocedure Evaluation (Addendum)
Anesthesia Evaluation  °Patient identified by MRN, date of birth, ID band °Patient awake ° ° ° °Reviewed: °Allergy & Precautions, Patient's Chart, lab work & pertinent test results ° °Airway °Mallampati: II ° °TM Distance: >3 FB °Neck ROM: Full ° ° ° Dental ° °(+) Poor Dentition, Missing °  °Pulmonary °neg pulmonary ROS,  °  °Pulmonary exam normal ° ° ° ° ° ° ° Cardiovascular °hypertension, Pt. on medications ° °Rhythm:Regular Rate:Normal ° °ECG: SB, rate 55 °  °Neuro/Psych °negative neurological ROS °   ° GI/Hepatic °Neg liver ROS, GERD  Medicated,  °Endo/Other  °negative endocrine ROS ° Renal/GU °Renal disease  ° °  °Musculoskeletal °negative musculoskeletal ROS °(+)  ° Abdominal °(+) + obese,   °Peds ° Hematology °negative hematology ROS °(+)   °Anesthesia Other Findings °Right shoulder rotator cuff tear ° Reproductive/Obstetrics ° °  ° ° ° ° ° ° ° ° ° ° ° ° ° °  °  ° ° ° ° ° ° °Anesthesia Physical °Anesthesia Plan ° °ASA: 2 ° °Anesthesia Plan: General and Regional  ° °Post-op Pain Management:   ° °Induction: Intravenous ° °PONV Risk Score and Plan: 2 and Ondansetron, Dexamethasone, Midazolam and Treatment may vary due to age or medical condition ° °Airway Management Planned: Mask and Oral ETT ° °Additional Equipment: None ° °Intra-op Plan:  ° °Post-operative Plan: Extubation in OR ° °Informed Consent: I have reviewed the patients History and Physical, chart, labs and discussed the procedure including the risks, benefits and alternatives for the proposed anesthesia with the patient or authorized representative who has indicated his/her understanding and acceptance.  ° ° ° °Dental advisory given ° °Plan Discussed with: CRNA ° °Anesthesia Plan Comments: (Lab Results °     Component                Value               Date                 °     WBC                      5.5                 04/23/2021           °     HGB                      16.5                04/23/2021           °      HCT                      48.5                04/23/2021           °     MCV                      88.7                04/23/2021           °     PLT                      218                   04/23/2021           °Lab Results °     Component                Value               Date                 °     NA                       137                 04/23/2021           °     K                        4.6                 04/23/2021           °     CO2                      23                  04/23/2021           °     GLUCOSE                  111 (H)             04/23/2021           °     BUN                      11                  04/23/2021           °     CREATININE               1.21                04/23/2021           °     CALCIUM                  9.1                 04/23/2021           °     EGFR                     62                  03/22/2021           °     GFRNONAA                 >60                 04/23/2021          )  ° ° ° ° °Anesthesia Quick Evaluation ° °

## 2021-04-23 NOTE — Anesthesia Postprocedure Evaluation (Signed)
Anesthesia Post Note  Patient: Arlis Porta Makepeace  Procedure(s) Performed: Right ARTHROSCOPIC ROTATOR CUFF REPAIR (Right: Shoulder)     Patient location during evaluation: PACU Anesthesia Type: Regional and General Level of consciousness: awake Pain management: pain level controlled Vital Signs Assessment: post-procedure vital signs reviewed and stable Respiratory status: spontaneous breathing, nonlabored ventilation, respiratory function stable and patient connected to nasal cannula oxygen Cardiovascular status: blood pressure returned to baseline and stable Postop Assessment: no apparent nausea or vomiting Anesthetic complications: no   No notable events documented.  Last Vitals:  Vitals:   04/23/21 1445 04/23/21 1500  BP: 125/78 138/84  Pulse: 64 69  Resp: 18 19  Temp:  36.4 C  SpO2: 95% 99%    Last Pain:  Vitals:   04/23/21 1500  TempSrc:   PainSc: 0-No pain                 Juan Kissoon P Stephenie Navejas

## 2021-04-23 NOTE — Discharge Instructions (Signed)
     Discharge Instructions    Attending Surgeon: Evian Derringer, MD Office Phone Number: [phone]   Diagnosis and Procedures:    Surgeries Performed: Right shoulder rotator cuff repair  Discharge Plan:    Diet: Resume usual diet. Begin with light or bland foods.  Drink plenty of fluids.  Activity:  Keep sling and dressing in place until your follow up visit in Physical Therapy You are advised to go home directly from the hospital or surgical center. Restrict your activities.  GENERAL INSTRUCTIONS: 1.  Keep your surgical site elevated above your heart for at least 5-7 days or longer to prevent swelling. This will improve your comfort and your overall recovery following surgery.     2. Please call Dr. Leeandra Ellerson's office at [phone] with questions Monday-Friday during business hours. If no one answers, please leave a message and someone should get back to the patient within 24 hours. For emergencies please call 911 or proceed to the emergency room.   3. Patient to notify surgical team if experiences any of the following: Bowel/Bladder dysfunction, uncontrolled pain, nerve/muscle weakness, incision with increased drainage or redness, nausea/vomiting and Fever greater than 101.0 F.  Be alert for signs of infection including redness, streaking, odor, fever or chills. Be alert for excessive pain or bleeding and notify your surgeon immediately.  WOUND INSTRUCTIONS:   Leave your dressing/cast/splint in place until your post operative visit.  Keep it clean and dry.  Always keep the incision clean and dry until the staples/sutures are removed. If there is no drainage from the incision you should keep it open to air. If there is drainage from the incision you must keep it covered at all times until the drainage stops  Do not soak in a bath tub, hot tub, pool, lake or other body of water until 21 days after your surgery and your incision is completely dry and healed.  If you have  removable sutures (or staples) they must be removed 10-14 days (unless otherwise instructed) from the day of your surgery.     1)  Elevate the extremity as much as possible.  2)  Keep the dressing clean and dry.  3)  Please call us if the dressing becomes wet or dirty.  4)  If you are experiencing worsening pain or worsening swelling, please call.     MEDICATIONS: Resume all previous home medications at the previous prescribed dose and frequency unless otherwise noted Start taking the  pain medications on an as-needed basis as prescribed  Please taper down pain medication over the next week following surgery.  Ideally you should not require a refill of any narcotic pain medication.  Take pain medication with food to minimize nausea. In addition to the prescribed pain medication, you may take over-the-counter pain relievers such as Tylenol.  Do NOT take additional tylenol if your pain medication already has tylenol in it.  Aspirin 325mg daily for four weeks.      FOLLOWUP INSTRUCTIONS: 1. Follow up at the Physical Therapy Clinic 3-4 days following surgery. This appointment should be scheduled unless other arrangements have been made.The Physical Therapy scheduling number is [phone] if an appointment has not already been arranged.  2. Contact Dr. Zabdiel Dripps's office during office hours at [phone] or the practice after hours line at [phone] for non-emergencies. For medical emergencies call 911.   Discharge Location: Home  

## 2021-04-23 NOTE — Op Note (Signed)
Date of Surgery: 04/23/2021  INDICATIONS: Craig Finley is a 61 y.o.-year-old male with right full-thickness rotator cuff repair which had failed nonoperative management.  The risk and benefits of the procedure with discussed in detail and documented in the pre-operative evaluation.  PREOPERATIVE DIAGNOSIS: 1.  Right full-thickness supraspinatus tear  POSTOPERATIVE DIAGNOSIS: Same.  PROCEDURE: 1.  Arthroscopic right full-thickness rotator cuff repair 2. Right shoulder acromioplasty 3. Right shoulder limited debridement  SURGEON: Benancio Deeds MD  ASSISTANT: Kerby Less, ATC; necessary for the timely completion of procedure and due to complexity of procedure.  ANESTHESIA:  general plus peripheral block  IV FLUIDS AND URINE: See anesthesia record.  ANTIBIOTICS: Ancef 2 g  ESTIMATED BLOOD LOSS: 10 mL.  IMPLANTS:  Implant Name Type Inv. Item Serial No. Manufacturer Lot No. LRB No. Used Action  ANCHOR SUT QUATTRO KNTLS 4.5 - [id number] Anchor ANCHOR SUT QUATTRO KNTLS 4.5  [employer](ORTH,TRAU,BIO,SG) [id number] Right 1 Implanted  JUGGERKNOT SOFT ANCHOR    [employer]immer [id number] Right 1 Implanted  SYSTEM IMPL TENODESIS LNT 4.75 - [id number] Orthopedic Implant SYSTEM IMPL TENODESIS LNT 4.75  [employer] [id number] Right 1 Implanted    DRAINS: None  CULTURES: None  COMPLICATIONS: none  DESCRIPTION OF PROCEDURE:  Examination under anesthesia revealed forward elevation of 170 degrees.  With the arm at the side, there was 60 degrees of external rotation.  There is a 1+ anterior load shift and a 1+ posterior load shift.   Arthroscopic findings demonstrated:  Glenoid cartilage: Normal Humeral head: Normal Labrum: Mild fraying at the posterior and superior labrum Biceps insertion: Normal Biceps tendon: Normal Subscapularis insertion: Intact Rotator cuff: Full-thickness supraspinatus tear Subacromial space: Significant subacromial bursitis  I identified the patient in the pre-operative  holding area.  I marked the operative right shoulder with my initials. I reviewed the risks and benefits of the proposed surgical intervention and the patient wished to proceed.  Anesthesia was then performed with regional block.  The patient was transferred to the operative suite and placed in the beach chair position with all bony prominences padded.     SCDs were placed on bilateral lower extremity. Appropriate antibiotics was administered within 1 hour before incision. Regional anesthesia was administered.The operative extremity was then prepped and draped in standard fashion. A time out was performed confirming the correct extremity, correct patient and correct procedure.  The arthroscope was introduced in the glenohumeral joint from a posterior portal.  An anterior portal was created.  The shoulder was examined and the above findings were noted.    With an arthroscopic shaver and an ArthroCare wand local debridement was performed involving the posterior and superior labrum as well as the subscapularis insertion.  This was done back to healthy appearing tissue  Through visualization from intra-articular, the footprint of the rotator cuff was debrided.  This was done with an arthroscopic shaver.  The footprint was abraded with the arthroscopic shaver to create a bleeding bony surface to encourage healing.   The rotator cuff was then approached through the subacromial space.  Anterior, anterolateral, posterolateral, and posterior portals were used.  Bursectomy was performed with an arthroscopic shaver and ArthroCare wand.  The arthroscopic shaver was then used on forward mode to perform a subacromial acromioplasty with care to resect a small amount of bone from the subsurface of the acromion.  A medial release was performed and good excursion was noted of the tendon back to its footprint which would be amendable to an repair.  The rotator  cuff was repaired with transosseous suture bridge configuration  with 1 medial all suture anchor which was subsequently passed from anterior to posterior in horizontal fashion with a Quatro suture passer. A total of 4 limbs of suture were passed.  The sutures were then secured into a lateral Quatro anchor.    The shoulder was irrigated.  The arthroscopic instruments were removed.  Wounds were closed with 3-0 nylon sutures.  A sterile dressing was applied with xeroform, 4x8s, abdominal pad, and tape. A polarcare was placed and the upper extremity was placed in a shoulder immobilizer.  The patient tolerated the procedure well and was taken to the recovery room in stable condition.    Kerby Less ATC was necessary for opening, closing, retracting, limb positioning and overall facilitation and timely completion of the procedure.     POSTOPERATIVE PLAN: Patient will be nonweightbearing on the right upper extremity.  He will remain in sling until follow-up with physical therapy.  He will be seen in office in 2 weeks for suture removal.  Benancio Deeds, MD 2:23 PM

## 2021-04-23 NOTE — Brief Op Note (Signed)
° °  Brief Op Note  Date of Surgery: 04/23/2021  Preoperative Diagnosis: Right shoulder rotator cuff tear  Postoperative Diagnosis: same  Procedure: Procedure(s): Right ARTHROSCOPIC ROTATOR CUFF REPAIR  Implants: Implant Name Type Inv. Item Serial No. Manufacturer Lot No. LRB No. Used Action  ANCHOR SUT QUATTRO KNTLS 4.5 - [id number] Anchor ANCHOR SUT QUATTRO KNTLS 4.5  ZIMMER [employer]ON(ORTH,TRAU,BIO,SG) [id number] Right 1 Implanted  JUGGERKNOT SOFT ANCHOR    Zimmer [id number] Right 1 Implanted  SYSTEM IMPL TENODESIS LNT 4.75 - [id number] Orthopedic Implant SYSTEM IMPL TENODESIS LNT 4.75  [employer] [id number] Right 1 Implanted    Surgeons: Surgeon(s): Huel Cote, MD  Anesthesia: Regional    Estimated Blood Loss: See anesthesia record  Complications: None  Condition to PACU: Stable  Benancio Deeds, MD 04/23/2021 2:20 PM

## 2021-04-23 NOTE — Interval H&P Note (Signed)
History and Physical Interval Note:  04/23/2021 11:32 AM  Craig Finley  has presented today for surgery, with the diagnosis of Right shoulder rotator cuff tear.  The various methods of treatment have been discussed with the patient and family. After consideration of risks, benefits and other options for treatment, the patient has consented to  Procedure(s): Right ARTHROSCOPIC ROTATOR CUFF REPAIR (Right) as a surgical intervention.  The patient's history has been reviewed, patient examined, no change in status, stable for surgery.  I have reviewed the patient's chart and labs.  Questions were answered to the patient's satisfaction.     Huel Cote

## 2021-04-23 NOTE — Transfer of Care (Signed)
Immediate Anesthesia Transfer of Care Note  Patient: Craig Finley  Procedure(s) Performed: Right ARTHROSCOPIC ROTATOR CUFF REPAIR (Right: Shoulder)  Patient Location: PACU  Anesthesia Type:General  Level of Consciousness: drowsy  Airway & Oxygen Therapy: Patient Spontanous Breathing and Patient connected to face mask oxygen  Post-op Assessment: Report given to RN and Post -op Vital signs reviewed and stable  Post vital signs: Reviewed and stable  Last Vitals:  Vitals Value Taken Time  BP 123/71 04/23/21 1430  Temp    Pulse 71 04/23/21 1432  Resp 16 04/23/21 1432  SpO2 100 % 04/23/21 1432  Vitals shown include unvalidated device data.  Last Pain:  Vitals:   04/23/21 1013  TempSrc:   PainSc: 0-No pain         Complications: No notable events documented.

## 2021-04-23 NOTE — Anesthesia Procedure Notes (Signed)
Procedure Name: Intubation Date/Time: 04/23/2021 12:16 PM Performed by: Erick Colace, CRNA Pre-anesthesia Checklist: Patient identified, Emergency Drugs available, Suction available and Patient being monitored Patient Re-evaluated:Patient Re-evaluated prior to induction Oxygen Delivery Method: Circle system utilized Preoxygenation: Pre-oxygenation with 100% oxygen Induction Type: IV induction Ventilation: Mask ventilation without difficulty Laryngoscope Size: Mac and 4 Grade View: Grade II Tube type: Oral Tube size: 7.5 mm Number of attempts: 1 Airway Equipment and Method: Stylet and Oral airway Placement Confirmation: ETT inserted through vocal cords under direct vision, positive ETCO2 and breath sounds checked- equal and bilateral Secured at: 23 cm Tube secured with: Tape Dental Injury: Teeth and Oropharynx as per pre-operative assessment

## 2021-04-24 ENCOUNTER — Encounter (HOSPITAL_COMMUNITY): Payer: Self-pay | Admitting: Orthopaedic Surgery

## 2021-04-26 ENCOUNTER — Ambulatory Visit: Payer: 59 | Attending: Internal Medicine | Admitting: Physical Therapy

## 2021-04-26 ENCOUNTER — Encounter: Payer: Self-pay | Admitting: Physical Therapy

## 2021-04-26 ENCOUNTER — Other Ambulatory Visit: Payer: Self-pay

## 2021-04-26 DIAGNOSIS — G8929 Other chronic pain: Secondary | ICD-10-CM | POA: Diagnosis present

## 2021-04-26 DIAGNOSIS — M25511 Pain in right shoulder: Secondary | ICD-10-CM | POA: Diagnosis not present

## 2021-04-26 DIAGNOSIS — S46011A Strain of muscle(s) and tendon(s) of the rotator cuff of right shoulder, initial encounter: Secondary | ICD-10-CM | POA: Diagnosis not present

## 2021-04-26 NOTE — Therapy (Signed)
[facility] [address], [place], [address] Phone: [phone]   Fax:  [phone]  Physical Therapy Evaluation  Patient Details  Name: Craig Finley MRN: [id number] Date of Birth: Mar 13, 1961 Referring Provider (PT): Vanetta Mulders   Encounter Date: 04/26/2021   PT End of Session - 04/26/21 1204     Visit Number 1    Number of Visits 18    Date for PT Re-Evaluation 06/22/21    Authorization - Visit Number 1    Authorization - Number of Visits 18    Progress Note Due on Visit 10    PT Start Time 0945    PT Stop Time 1030    PT Time Calculation (min) 45 min    Activity Tolerance Patient tolerated treatment well    Behavior During Therapy [facility] for tasks assessed/performed             Past Medical History:  Diagnosis Date   Allergy    Blood in stool    Chest pain    a. 11/2019 Cor CTA: Ca2 = 0. Nl cors.   CKD (chronic kidney disease) stage 2, GFR 60-89 ml/min    GERD (gastroesophageal reflux disease)    Heart murmur    as a child:   Hypertension    Pulmonary nodules    a. 11/2019 Cor CTA: Small L lung nodules - largest 64mm - incidental finding; b. 12/2020 Chest CT: Sm bilat pulm nodules - unchanged.    Past Surgical History:  Procedure Laterality Date   ANAL FISSURE REPAIR  05/1998   some type of anal fissure procedure   ARTHOSCOPIC ROTAOR CUFF REPAIR Right 04/23/2021   Procedure: Right ARTHROSCOPIC ROTATOR CUFF REPAIR;  Surgeon: Vanetta Mulders, MD;  Location: [facility];  Service: Orthopedics;  Laterality: Right;   COLONOSCOPY WITH PROPOFOL N/A 01/31/2020   Procedure: COLONOSCOPY WITH PROPOFOL;  Surgeon: Lin Landsman, MD;  Location: [facility] ENDOSCOPY;  Service: Gastroenterology;  Laterality: N/A;   ESOPHAGOGASTRODUODENOSCOPY (EGD) WITH PROPOFOL N/A 01/31/2020   Procedure: ESOPHAGOGASTRODUODENOSCOPY (EGD) WITH PROPOFOL;  Surgeon: Lin Landsman, MD;  Location: [facility] ENDOSCOPY;   Service: Gastroenterology;  Laterality: N/A;    There were no vitals filed for this visit.    Subjective Assessment - 04/26/21 0948     Subjective 61 y.o. male presents with acute shoulder pain s/p right shoulder arthoplasty    Pertinent History 61 y.o. male patient presents with acute R shoulder pain S/P right arthroscopic supraspinatus repair on 04/23/21. pnt describes pain in R shoulder as a constant pressure and staying at a 1/10 NPS since surgery. Pnt has 3 steps entering into home with bilateral railing- pnt reports no trouble with this. Pnt reports numbness in R thumb and aggrevating factors include sleeping positioning and taking arm out of sling. pnt has had prior physical therapy for his shoulder that he reported helped him. pnt currently sleeping in a recliner and wants to have better sleep- the general "stiffness" is keeping him awake at night. Easing factors include NSAIDs and ice which he applies 2x a day for approx 30 min. pnt goals include going back to playing softball (prior playing regularly on local team) and wanting to sleep comfortably. pnt denys weight change, B/B changes, n/v, fever, night pain, or falls within the last 6 months.    Limitations Lifting;House hold activities    How long can you sit comfortably? unlimited    How long can you stand comfortably? unlimited  How long can you walk comfortably? unlimited    Patient Stated Goals pnt would like to sleep comfortably and return to softball    Currently in Pain? Yes    Pain Score 1     Pain Location Shoulder    Pain Orientation Right    Pain Descriptors / Indicators Dull    Pain Type Acute pain;Surgical pain    Pain Radiating Towards Right thumb    Pain Onset In the past 7 days    Pain Frequency Constant    Aggravating Factors  sleeping, taking arm out of sling    Pain Relieving Factors ice, NSAIDs    Effect of Pain on Daily Activities limits recreational activity              Skin Inspection:   wrapping still on stitches, but no visible drainage, per pt to remain on a week, will examine incision next week  Posture: Kyphosis  Rounded shoulders with forward head posture   Left Shoulder AROM/PROM Flexion: 180/ 185 Abduction: 170/180 Extension: 50/60  ER; 80/85 IR : 90, apley scratch to inferior scapula  Right Shoulder AROM/PROM Unable to access due to surgery precautions- will assess true PROM next session  Left elbow AROM Flexion: WFL Extension: 0 degrees  Right elbow AROM Flexion: WFL Extension: lacking 5 degrees with pnt reported "stiffness"  Bilateral Wrist  All gross mvmt WFL  Left shoulder MMT Flexion: 5/5 Abduction: 5/5 Extension: 5/5  ER :5/5 IR: 5/ 5  Right Shoulder MMT Unable to access due to surgery precautions  Grip Dynamometer: L: 85lbs, R:25lbs      Therex  PT reviewed the following HEP with patient with patient able to demonstrate a set of the following with min cuing for correction needed. PT educated patient on parameters of therex (how/when to inc/decrease intensity, frequency, rep/set range, stretch hold time, and purpose of therex) with verbalized understanding. PT reviewed surgical precautions with understanding (Ie not to drive, in brace for 6 weeks, ice regimen, no out-of-brace activity until PT PROM next week) Gripping 3 x 15  elbow flexion and extension x12 multiple times a day     [facility] PT Assessment - 04/26/21 0001       Assessment   Medical Diagnosis R arthroscopic supraspinatus repair    Referring Provider (PT) Vanetta Mulders    Hand Dominance Right    Next MD Visit 05/07/21    Prior Therapy prior to surgery      Precautions   Precautions Shoulder    Type of Shoulder Precautions RTC repair      Balance Screen   Has the patient fallen in the past 6 months No    Has the patient had a decrease in activity level because of a fear of falling?  No    Is the patient reluctant to leave their home because of a fear of falling?   No                      Objective measurements completed on examination: See above findings.             PT Education - 04/26/21 1203     Education Details prognosis, HEP    Person(s) Educated Patient    Methods Explanation;Demonstration    Comprehension Verbalized understanding;Returned demonstration              PT Short Term Goals - 04/26/21 1227       PT SHORT TERM GOAL #1  Title Pt will demonstrate independence with HEP to improve R shoulder function for increased ability to participate with ADLs.    Baseline 04/26/21 HEP given    Time 4    Period Weeks    Status New    Target Date 05/24/21               PT Long Term Goals - 04/26/21 1228       PT LONG TERM GOAL #1   Title Pnt will increase R shoulder abduction and flexion ROM to >170 degrees in order to complete functional ADLs    Baseline 04/26/21 unable to access    Time 8    Period Weeks    Status New    Target Date 06/21/21      PT LONG TERM GOAL #2   Title Pnt will be able to comeplete Apley scratch test on R shoulder and touch inferior border of left scapula to show clinically significant increase in IR to shower independently.    Baseline 04/26/21 unable to access    Time 8    Period Weeks    Status New    Target Date 06/21/21      PT LONG TERM GOAL #3   Title pnt will increase R grip dynomometer score by to 85# to demonstrate PLOFstrength evidenced by uninvolved side needed for heavy household tasks    Baseline 04/26/21 R: 25lbs    Time 8    Period Weeks    Status New    Target Date 06/21/21      PT LONG TERM GOAL #4   Title Pt will demonstrate gross R shoulder and elbow strength of 5/5 in order to demonstrate PLOF strength for heavy household ADLs and return to sport    Baseline 04/26/21 no test performed    Time 8    Period Weeks    Status New                    Plan - 04/26/21 1205     Clinical Impression Statement pnt presents with actute right  shoulder pain S/P right shoulder arthroscopic supraspinatus repair 04/23/21. pnt impairments include decreased gross ROM, MMT, pain, and motor control of right shoulder. The listed deficits decrease pnt ability to lift, carry, transfer while sleeping, throw push, and pull; inhibiting paricipation in ADLs and recreational activites (softball). pnt tolerated therex well with good motivation and demonstrated good verbal understanding of therex and surgical restrictions. Skilled physical therapy is required to address the following deficits to imporve strength, decrease pain, improve ROM, and increase motor control functions in order to complete household ADLs and return to softball.    Personal Factors and Comorbidities Past/Current Experience;Age    Examination-Activity Limitations Carry;Lift;Bed Mobility;Sleep;Dressing;Reach Overhead;Hygiene/Grooming    Examination-Participation Restrictions Driving;Cleaning;Meal Prep;Yard Work;Community Activity;Laundry;Occupation    Stability/Clinical Decision Making Evolving/Moderate complexity    Clinical Decision Making Moderate    Rehab Potential Good    PT Frequency 2x / week    PT Duration 8 weeks    PT Treatment/Interventions ADLs/Self Care Home Management;Cryotherapy;Electrical Stimulation;Iontophoresis 4mg /ml Dexamethasone;Moist Heat;Traction;Ultrasound;Functional mobility training;Therapeutic activities;Neuromuscular re-education;Therapeutic exercise;Patient/family education;Manual techniques;Passive range of motion;Dry needling;Splinting;Scar mobilization;Spinal Manipulations;Joint Manipulations    PT Next Visit Plan ROM/strengthening    PT Home Exercise Plan Elbow flexion/extension, gripping    Consulted and Agree with Plan of Care Patient             Patient will benefit from skilled therapeutic intervention in order to improve the following deficits  and impairments:  Decreased activity tolerance, Decreased endurance, Decreased range of motion,  Decreased strength, Hypomobility, Increased fascial restricitons, Impaired UE functional use, Pain, Decreased coordination, Decreased mobility, Postural dysfunction  Visit Diagnosis: Chronic right shoulder pain     Problem List Patient Active Problem List   Diagnosis Date Noted   Complete tear of right rotator cuff 03/22/2021   Right shoulder pain 01/30/2021   Primary hypertension 01/30/2021   Non-seasonal allergic rhinitis 02/14/2020   Chest pain, non-cardiac 01/07/2020   Pulmonary nodules 01/07/2020   Essential hypertension 12/09/2019   Elevated serum creatinine 11/27/2019   Elevated BP without diagnosis of hypertension 11/27/2019   Breast pain, left 11/27/2019   Rectal bleeding 11/04/2019   Gastroesophageal reflux disease 11/04/2019   Chest pain 11/04/2019   SOB (shortness of breath) 11/04/2019   BMI 30.0-30.9,adult 11/04/2019   Encounter for HCV screening test for low risk patient 11/04/2019   Screening for HIV (human immunodeficiency virus) 11/04/2019   Encounter for screening and preventative care 11/04/2019   Tobacco chew use 11/04/2019    Durwin Reges DPT Claiborne Billings O'Daniel, SPT Durwin Reges, PT 04/26/2021, 4:26 PM  [facility] [address] [place], [address] Phone: [phone]   Fax:  [phone]  Name: Craig Finley MRN: [id number] Date of Birth: June 22, 1960

## 2021-05-01 ENCOUNTER — Ambulatory Visit: Payer: 59 | Admitting: Physical Therapy

## 2021-05-01 ENCOUNTER — Other Ambulatory Visit: Payer: Self-pay

## 2021-05-01 ENCOUNTER — Encounter: Payer: Self-pay | Admitting: Physical Therapy

## 2021-05-01 DIAGNOSIS — M25511 Pain in right shoulder: Secondary | ICD-10-CM | POA: Diagnosis not present

## 2021-05-01 DIAGNOSIS — G8929 Other chronic pain: Secondary | ICD-10-CM

## 2021-05-01 NOTE — Therapy (Signed)
[facility] [address] [place], [address] Phone: [phone]   Fax:  [phone]  Physical Therapy Treatment  Patient Details  Name: Craig Finley MRN: [id number] Date of Birth: 05/15/1960 Referring Provider (PT): Vanetta Mulders   Encounter Date: 05/01/2021   PT End of Session - 05/01/21 1434     Visit Number 2    Number of Visits 18    Date for PT Re-Evaluation 06/22/21    Authorization Type [employer] Generic    Authorization Time Period 01/23/21-03/20/21    Authorization - Visit Number 2    Authorization - Number of Visits 18    Progress Note Due on Visit 10    PT Start Time 1350    PT Stop Time 1430    PT Time Calculation (min) 40 min    Activity Tolerance Patient tolerated treatment well    Behavior During Therapy [facility] for tasks assessed/performed             Past Medical History:  Diagnosis Date   Allergy    Blood in stool    Chest pain    a. 11/2019 Cor CTA: Ca2 = 0. Nl cors.   CKD (chronic kidney disease) stage 2, GFR 60-89 ml/min    GERD (gastroesophageal reflux disease)    Heart murmur    as a child:   Hypertension    Pulmonary nodules    a. 11/2019 Cor CTA: Small L lung nodules - largest 37mm - incidental finding; b. 12/2020 Chest CT: Sm bilat pulm nodules - unchanged.    Past Surgical History:  Procedure Laterality Date   ANAL FISSURE REPAIR  05/1998   some type of anal fissure procedure   ARTHOSCOPIC ROTAOR CUFF REPAIR Right 04/23/2021   Procedure: Right ARTHROSCOPIC ROTATOR CUFF REPAIR;  Surgeon: Vanetta Mulders, MD;  Location: [facility];  Service: Orthopedics;  Laterality: Right;   COLONOSCOPY WITH PROPOFOL N/A 01/31/2020   Procedure: COLONOSCOPY WITH PROPOFOL;  Surgeon: Lin Landsman, MD;  Location: [facility] ENDOSCOPY;  Service: Gastroenterology;  Laterality: N/A;   ESOPHAGOGASTRODUODENOSCOPY (EGD) WITH PROPOFOL N/A 01/31/2020   Procedure: ESOPHAGOGASTRODUODENOSCOPY (EGD)  WITH PROPOFOL;  Surgeon: Lin Landsman, MD;  Location: [facility] ENDOSCOPY;  Service: Gastroenterology;  Laterality: N/A;    There were no vitals filed for this visit.   Subjective Assessment - 05/01/21 1353     Subjective pnt reports current 2/10 NPS. pnt only taking tylenol as needed. pnt being compliant with HEP    Pertinent History 61 y.o. male patient presents with acute R shoulder pain S/P right arthroscopic supraspinatus repair on 04/23/21. pnt describes pain in R shoulder as a constant pressure and staying at a 1/10 NPS since surgery. Pnt has 3 steps entering into home with bilateral railing- pnt reports no trouble with this. Pnt reports numbness in R thumb and aggrevating factors include sleeping positioning and taking arm out of sling. pnt has had prior physical therapy for his shoulder that he reported helped him. pnt currently sleeping in a recliner and wants to have better sleep- the general "stiffness" is keeping him awake at night. Easing factors include NSAIDs and ice which he applies 2x a day for approx 30 min. pnt goals include going back to playing softball (prior playing regularly on local team) and wanting to sleep comfortably. pnt denys weight change, B/B changes, n/v, fever, night pain, or falls within the last 6 months.    Limitations Lifting;House hold activities  How long can you sit comfortably? unlimited    How long can you stand comfortably? unlimited    How long can you walk comfortably? unlimited    Diagnostic tests xray, unremarkable aside from age-typical arthrosis    Patient Stated Goals pnt would like to sleep comfortably and return to softball    Currently in Pain? Yes    Pain Score 2     Pain Location Shoulder    Pain Orientation Right    Pain Descriptors / Indicators Dull    Pain Type Acute pain;Surgical pain    Pain Onset 1 to 4 weeks ago    Pain Frequency Constant    Aggravating Factors  sleeping, taking arm out of sling    Pain Relieving Factors ice,  NSAIDs    Effect of Pain on Daily Activities limites recreational activity               Therex Grip squeezes 3 x 12  Elbow flexion and extension 2x 12 HEP reviewed with understandign  Manual Therapy PROM R shoulder flexion approx 50 degrees x 12 PROM R shoulder abduction approx 55 degrees x 12   unbilled: PT removed gauze, inspected skin (WNL), and educated on wound care                        PT Education - 05/01/21 1434     Education Details HEP    Person(s) Educated Patient    Methods Explanation;Tactile cues;Verbal cues    Comprehension Verbalized understanding;Returned demonstration              PT Short Term Goals - 04/26/21 1227       PT SHORT TERM GOAL #1   Title Pt will demonstrate independence with HEP to improve R shoulder function for increased ability to participate with ADLs.    Baseline 04/26/21 HEP given    Time 4    Period Weeks    Status New    Target Date 05/24/21               PT Long Term Goals - 04/26/21 1228       PT LONG TERM GOAL #1   Title Pnt will increase R shoulder abduction and flexion ROM to >170 degrees in order to complete functional ADLs    Baseline 04/26/21 unable to access    Time 8    Period Weeks    Status New    Target Date 06/21/21      PT LONG TERM GOAL #2   Title Pnt will be able to comeplete Apley scratch test on R shoulder and touch inferior border of left scapula to show clinically significant increase in IR to shower independently.    Baseline 04/26/21 unable to access    Time 8    Period Weeks    Status New    Target Date 06/21/21      PT LONG TERM GOAL #3   Title pnt will increase R grip dynomometer score by to 85# to demonstrate PLOFstrength evidenced by uninvolved side needed for heavy household tasks    Baseline 04/26/21 R: 25lbs    Time 8    Period Weeks    Status New    Target Date 06/21/21      PT LONG TERM GOAL #4   Title Pt will demonstrate gross R shoulder and elbow  strength of 5/5 in order to demonstrate PLOF strength for heavy household ADLs and return to  sport    Baseline 04/26/21 no test performed    Time 8    Period Weeks    Status New                   Plan - 05/01/21 1435     Clinical Impression Statement pnt presented with minimal pain today. spt performed only PROM manual therapy per surgical post opt protocol. pnt tolerated manual therapy well and spt reviewed HEP exercises. pnt maintained good motivation throughout session and expressed verbal understanding of HEP and surgical protocols. PT removed large bandage per patient and surgeon request, and covered scope incision sites, noted healing, and no signs of infection. PT will continue as able    Personal Factors and Comorbidities Past/Current Experience;Age    Examination-Activity Limitations Carry;Lift;Bed Mobility;Sleep;Dressing;Reach Overhead;Hygiene/Grooming    Examination-Participation Restrictions Driving;Cleaning;Meal Prep;Yard Work;Community Activity;Laundry;Occupation    Stability/Clinical Decision Making Evolving/Moderate complexity    Clinical Decision Making Moderate    Rehab Potential Good    PT Frequency 2x / week    PT Duration 8 weeks    PT Treatment/Interventions ADLs/Self Care Home Management;Cryotherapy;Electrical Stimulation;Iontophoresis 4mg /ml Dexamethasone;Moist Heat;Traction;Ultrasound;Functional mobility training;Therapeutic activities;Neuromuscular re-education;Therapeutic exercise;Patient/family education;Manual techniques;Passive range of motion;Dry needling;Splinting;Scar mobilization;Spinal Manipulations;Joint Manipulations    PT Next Visit Plan ROM/strengthening    PT Home Exercise Plan Elbow flexion/extension, gripping    Consulted and Agree with Plan of Care Patient             Patient will benefit from skilled therapeutic intervention in order to improve the following deficits and impairments:  Decreased activity tolerance, Decreased endurance,  Decreased range of motion, Decreased strength, Hypomobility, Increased fascial restricitons, Impaired UE functional use, Pain, Decreased coordination, Decreased mobility, Postural dysfunction  Visit Diagnosis: Chronic right shoulder pain     Problem List Patient Active Problem List   Diagnosis Date Noted   Complete tear of right rotator cuff 03/22/2021   Right shoulder pain 01/30/2021   Primary hypertension 01/30/2021   Non-seasonal allergic rhinitis 02/14/2020   Chest pain, non-cardiac 01/07/2020   Pulmonary nodules 01/07/2020   Essential hypertension 12/09/2019   Elevated serum creatinine 11/27/2019   Elevated BP without diagnosis of hypertension 11/27/2019   Breast pain, left 11/27/2019   Rectal bleeding 11/04/2019   Gastroesophageal reflux disease 11/04/2019   Chest pain 11/04/2019   SOB (shortness of breath) 11/04/2019   BMI 30.0-30.9,adult 11/04/2019   Encounter for HCV screening test for low risk patient 11/04/2019   Screening for HIV (human immunodeficiency virus) 11/04/2019   Encounter for screening and preventative care 11/04/2019   Tobacco chew use 11/04/2019   Aletha Halim, SPT Durwin Reges, PT 05/02/2021, 8:48 AM  [facility] [address], [place], [address] Phone: [phone]   Fax:  [phone]  Name: Craig Finley MRN: [id number] Date of Birth: [birth year]

## 2021-05-07 ENCOUNTER — Ambulatory Visit (INDEPENDENT_AMBULATORY_CARE_PROVIDER_SITE_OTHER): Payer: 59 | Admitting: Orthopaedic Surgery

## 2021-05-07 ENCOUNTER — Telehealth: Payer: Self-pay | Admitting: Orthopaedic Surgery

## 2021-05-07 ENCOUNTER — Other Ambulatory Visit: Payer: Self-pay

## 2021-05-07 DIAGNOSIS — S46011A Strain of muscle(s) and tendon(s) of the rotator cuff of right shoulder, initial encounter: Secondary | ICD-10-CM

## 2021-05-07 NOTE — Telephone Encounter (Signed)
Received $75.00 cash,3 medical records release forms and FMLA/Disability paperwork from patient     Forwarding to [place] today.

## 2021-05-07 NOTE — Progress Notes (Signed)
Post Operative Evaluation    Procedure/Date of Surgery: Right shoulder rotator cuff repair done on January 72, 6483   61 year old male status post right rotator cuff repair 2 weeks prior.  He is doing very well today's visit.  He has been working with Vikki Ports at [facility] in terms of physical therapy.  He has been taking his aspirin as well as ibuprofen.  Pain is very well controlled   PMH/PSH/Family History/Social History/Meds/Allergies:    Past Medical History:  Diagnosis Date   Allergy    Blood in stool    Chest pain    a. 11/2019 Cor CTA: Ca2 = 0. Nl cors.   CKD (chronic kidney disease) stage 2, GFR 60-89 ml/min    GERD (gastroesophageal reflux disease)    Heart murmur    as a child:   Hypertension    Pulmonary nodules    a. 11/2019 Cor CTA: Small L lung nodules - largest 55mm - incidental finding; b. 12/2020 Chest CT: Sm bilat pulm nodules - unchanged.   Past Surgical History:  Procedure Laterality Date   ANAL FISSURE REPAIR  05/1998   some type of anal fissure procedure   ARTHOSCOPIC ROTAOR CUFF REPAIR Right 04/23/2021   Procedure: Right ARTHROSCOPIC ROTATOR CUFF REPAIR;  Surgeon: Vanetta Mulders, MD;  Location: [place];  Service: Orthopedics;  Laterality: Right;   COLONOSCOPY WITH PROPOFOL N/A 01/31/2020   Procedure: COLONOSCOPY WITH PROPOFOL;  Surgeon: Lin Landsman, MD;  Location: [facility] ENDOSCOPY;  Service: Gastroenterology;  Laterality: N/A;   ESOPHAGOGASTRODUODENOSCOPY (EGD) WITH PROPOFOL N/A 01/31/2020   Procedure: ESOPHAGOGASTRODUODENOSCOPY (EGD) WITH PROPOFOL;  Surgeon: Lin Landsman, MD;  Location: [facility] ENDOSCOPY;  Service: Gastroenterology;  Laterality: N/A;   Social History   Socioeconomic History   Marital status: Married    Spouse name: Not on file   Number of children: Not on file   Years of education: Not on file   Highest education level: Some college, no degree  Occupational History   Occupation: trucker   Tobacco Use   Smoking status: Never   Smokeless tobacco: Former    Types: Snuff    Quit date: 04/02/2021   Tobacco comments:    dipping x 25-30 years  Vaping Use   Vaping Use: Never used  Substance and Sexual Activity   Alcohol use: Yes    Comment: drinks 4-7 beers some days and none others. 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    Social Determinants of Health   Financial Resource Strain: Not on file  Food Insecurity: Not on file  Transportation Needs: Not on file  Physical Activity: Not on file  Stress: Not on file  Social Connections: Not on file   Family History  Problem Relation Age of Onset   Arthritis Mother    Asthma Mother    Hypertension Mother    Hearing loss Father    Alcohol abuse Sister    Cancer Sister    Depression Sister    Drug abuse Sister    Early death Sister    Heart attack Sister    Hypertension Sister  Miscarriages / Stillbirths Sister    Asthma Sister    Depression Sister    Heart disease Maternal Uncle    Heart disease Maternal Uncle    Alcohol abuse Maternal Grandmother    Cancer Maternal Grandmother    Cancer Maternal Grandfather    Asthma Daughter    Asthma Son    No Known Allergies Current Outpatient Medications  Medication Sig Dispense Refill   acetaminophen (TYLENOL) 500 MG tablet Take 1,000 mg by mouth every 8 (eight) hours as needed for moderate pain.     amLODipine (NORVASC) 5 MG tablet Take 1 tablet (5 mg total) by mouth daily. 90 tablet 3   Fexofenadine-Pseudoephedrine (ALLEGRA-D PO) Take 1 tablet by mouth daily as needed (allergies).     omeprazole (PRILOSEC) 20 MG capsule Take 1 capsule (20 mg total) by mouth daily. (Patient taking differently: Take 20 mg by mouth every other day.) 30 capsule 3   triamcinolone (NASACORT) 55 MCG/ACT AERO nasal inhaler Place 2 sprays  into the nose daily. (Patient taking differently: Place 2 sprays into the nose daily as needed (allergies).) 1 each 4   No current facility-administered medications for this visit.   No results found.  Review of Systems:   A ROS was performed including pertinent positives and negatives as documented in the HPI.   Musculoskeletal Exam:    There were no vitals taken for this visit.  Incisions are clean dry and intact.  No erythema or drainage.  Able to flex Of the right elbow and right wrist.  Sensation intact all distributions including axillary distribution 2+ radial pulse  Imaging:    None  I personally reviewed and interpreted the radiographs.   Assessment:   61 year old male 2 weeks status post right rotator cuff repair overall he is doing very well.  At this time I will begin passive and passive assisted range of motion.  He will work on this in [employer].  I will see him back in 4 weeks  Plan :    -Return to clinic in 4 weeks     I personally saw and evaluated the patient, and participated in the management and treatment plan.  Vanetta Mulders, MD Attending Physician, Orthopedic Surgery  This document was dictated using Dragon voice recognition software. A reasonable attempt at proof reading has been made to minimize errors.

## 2021-05-08 ENCOUNTER — Ambulatory Visit: Payer: 59 | Admitting: Physical Therapy

## 2021-05-08 ENCOUNTER — Encounter: Payer: Self-pay | Admitting: Physical Therapy

## 2021-05-08 DIAGNOSIS — M25511 Pain in right shoulder: Secondary | ICD-10-CM | POA: Diagnosis not present

## 2021-05-08 DIAGNOSIS — G8929 Other chronic pain: Secondary | ICD-10-CM

## 2021-05-08 NOTE — Therapy (Signed)
[facility] [address], [place], [address] Phone: [phone]   Fax:  [phone]  Physical Therapy Treatment  Patient Details  Name: Craig Finley MRN: [id number] Date of Birth: July 04, 1960 Referring Provider (PT): Vanetta Mulders   Encounter Date: 05/08/2021   PT End of Session - 05/08/21 1108     Visit Number 3    Number of Visits 18    Authorization - Visit Number 3    Authorization - Number of Visits 18    PT Start Time [id number]    PT Stop Time 1103    PT Time Calculation (min) 33 min    Activity Tolerance Patient tolerated treatment well    Behavior During Therapy [facility] for tasks assessed/performed             Past Medical History:  Diagnosis Date   Allergy    Blood in stool    Chest pain    a. 11/2019 Cor CTA: Ca2 = 0. Nl cors.   CKD (chronic kidney disease) stage 2, GFR 60-89 ml/min    GERD (gastroesophageal reflux disease)    Heart murmur    as a child:   Hypertension    Pulmonary nodules    a. 11/2019 Cor CTA: Small L lung nodules - largest 6mm - incidental finding; b. 12/2020 Chest CT: Sm bilat pulm nodules - unchanged.    Past Surgical History:  Procedure Laterality Date   ANAL FISSURE REPAIR  05/1998   some type of anal fissure procedure   ARTHOSCOPIC ROTAOR CUFF REPAIR Right 04/23/2021   Procedure: Right ARTHROSCOPIC ROTATOR CUFF REPAIR;  Surgeon: Vanetta Mulders, MD;  Location: [place];  Service: Orthopedics;  Laterality: Right;   COLONOSCOPY WITH PROPOFOL N/A 01/31/2020   Procedure: COLONOSCOPY WITH PROPOFOL;  Surgeon: Lin Landsman, MD;  Location: [facility] ENDOSCOPY;  Service: Gastroenterology;  Laterality: N/A;   ESOPHAGOGASTRODUODENOSCOPY (EGD) WITH PROPOFOL N/A 01/31/2020   Procedure: ESOPHAGOGASTRODUODENOSCOPY (EGD) WITH PROPOFOL;  Surgeon: Lin Landsman, MD;  Location: [facility] ENDOSCOPY;  Service: Gastroenterology;  Laterality: N/A;    There were no vitals filed  for this visit.   Subjective Assessment - 05/08/21 1035     Subjective pnt reports 2/10 NPS. Sleeping is really aggrevating and pnt is unable to sleep well.    Pertinent History 61 y.o. male patient presents with acute R shoulder pain S/P right arthroscopic supraspinatus repair on 04/23/21. pnt describes pain in R shoulder as a constant pressure and staying at a 1/10 NPS since surgery. Pnt has 3 steps entering into home with bilateral railing- pnt reports no trouble with this. Pnt reports numbness in R thumb and aggrevating factors include sleeping positioning and taking arm out of sling. pnt has had prior physical therapy for his shoulder that he reported helped him. pnt currently sleeping in a recliner and wants to have better sleep- the general "stiffness" is keeping him awake at night. Easing factors include NSAIDs and ice which he applies 2x a day for approx 30 min. pnt goals include going back to playing softball (prior playing regularly on local team) and wanting to sleep comfortably. pnt denys weight change, B/B changes, n/v, fever, night pain, or falls within the last 6 months.    Limitations Lifting;House hold activities    How long can you sit comfortably? unlimited    How long can you stand comfortably? unlimited    How long can you walk comfortably? unlimited    Diagnostic tests  xray, unremarkable aside from age-typical arthrosis    Patient Stated Goals pnt would like to sleep comfortably and return to softball    Currently in Pain? Yes    Pain Score 2     Pain Location Shoulder    Pain Orientation Right    Pain Descriptors / Indicators Dull    Pain Type Acute pain;Surgical pain    Pain Onset 1 to 4 weeks ago    Pain Frequency Constant                  Thereex: Elbow AROM flexion/extension 3 x 12- min cueing for full elbow extension Elbow AROM pronation/supination 3 x 12 Pendulums x12 - mod cueing to not actively assist    Manual Therapy: PROM flexion approx. 80  degrees PROM abd 70 degrees PROM ER at side 30 degrees  15 min   SPT updated HEP to include pendulums for increased passive shoulder mobility. Pendulums performed within session to ensure proper passive technique.         PT Education - 05/08/21 1107     Education Details HEP and thereex    Person(s) Educated Patient    Methods Explanation;Demonstration;Verbal cues    Comprehension Verbalized understanding;Returned demonstration              PT Short Term Goals - 04/26/21 1227       PT SHORT TERM GOAL #1   Title Pt will demonstrate independence with HEP to improve R shoulder function for increased ability to participate with ADLs.    Baseline 04/26/21 HEP given    Time 4    Period Weeks    Status New    Target Date 05/24/21               PT Long Term Goals - 04/26/21 1228       PT LONG TERM GOAL #1   Title Pnt will increase R shoulder abduction and flexion ROM to >170 degrees in order to complete functional ADLs    Baseline 04/26/21 unable to access    Time 8    Period Weeks    Status New    Target Date 06/21/21      PT LONG TERM GOAL #2   Title Pnt will be able to comeplete Apley scratch test on R shoulder and touch inferior border of left scapula to show clinically significant increase in IR to shower independently.    Baseline 04/26/21 unable to access    Time 8    Period Weeks    Status New    Target Date 06/21/21      PT LONG TERM GOAL #3   Title pnt will increase R grip dynomometer score by to 85# to demonstrate PLOFstrength evidenced by uninvolved side needed for heavy household tasks    Baseline 04/26/21 R: 25lbs    Time 8    Period Weeks    Status New    Target Date 06/21/21      PT LONG TERM GOAL #4   Title Pt will demonstrate gross R shoulder and elbow strength of 5/5 in order to demonstrate PLOF strength for heavy household ADLs and return to sport    Baseline 04/26/21 no test performed    Time 8    Period Weeks    Status New                    Plan - 05/08/21 1108     Clinical Impression Statement SPT  progressd therex to include full elbow AROM and manual therapy PROM for increased mobility in the right shoulder. Pnt maintained good motivation throughout session with returned demonstration of understadning. PT will continue to follow post surgical protocol and progress as able.    Personal Factors and Comorbidities Past/Current Experience;Age    Examination-Activity Limitations Carry;Lift;Bed Mobility;Sleep;Dressing;Reach Overhead;Hygiene/Grooming    Examination-Participation Restrictions Driving;Cleaning;Meal Prep;Yard Work;Community Activity;Laundry;Occupation    Stability/Clinical Decision Making Evolving/Moderate complexity    Clinical Decision Making Moderate    Rehab Potential Good    PT Frequency 2x / week    PT Duration 8 weeks    PT Treatment/Interventions ADLs/Self Care Home Management;Cryotherapy;Electrical Stimulation;Iontophoresis 4mg /ml Dexamethasone;Moist Heat;Traction;Ultrasound;Functional mobility training;Therapeutic activities;Neuromuscular re-education;Therapeutic exercise;Patient/family education;Manual techniques;Passive range of motion;Dry needling;Splinting;Scar mobilization;Spinal Manipulations;Joint Manipulations    PT Next Visit Plan ROM/strengthening    PT Home Exercise Plan Elbow flexion/extension, gripping, pendulums    Consulted and Agree with Plan of Care Patient             Patient will benefit from skilled therapeutic intervention in order to improve the following deficits and impairments:  Decreased activity tolerance, Decreased endurance, Decreased range of motion, Decreased strength, Hypomobility, Increased fascial restricitons, Impaired UE functional use, Pain, Decreased coordination, Decreased mobility, Postural dysfunction  Visit Diagnosis: Chronic right shoulder pain     Problem List Patient Active Problem List   Diagnosis Date Noted   Complete tear of right rotator  cuff 03/22/2021   Right shoulder pain 01/30/2021   Primary hypertension 01/30/2021   Non-seasonal allergic rhinitis 02/14/2020   Chest pain, non-cardiac 01/07/2020   Pulmonary nodules 01/07/2020   Essential hypertension 12/09/2019   Elevated serum creatinine 11/27/2019   Elevated BP without diagnosis of hypertension 11/27/2019   Breast pain, left 11/27/2019   Rectal bleeding 11/04/2019   Gastroesophageal reflux disease 11/04/2019   Chest pain 11/04/2019   SOB (shortness of breath) 11/04/2019   BMI 30.0-30.9,adult 11/04/2019   Encounter for HCV screening test for low risk patient 11/04/2019   Screening for HIV (human immunodeficiency virus) 11/04/2019   Encounter for screening and preventative care 11/04/2019   Tobacco chew use 11/04/2019   Claiborne Billings O'Daniel, SPT  Patrina Levering PT, DPT   [facility] [address] [place], [address] Phone: [phone]   Fax:  [phone]  Name: Craig Finley MRN: [id number] Date of Birth: 02/17/1961

## 2021-05-11 ENCOUNTER — Telehealth: Payer: Self-pay | Admitting: Orthopaedic Surgery

## 2021-05-11 NOTE — Telephone Encounter (Signed)
Pt submitted medical release form, short term disability forms, and $25.00 cash payment to [employer]. Accepted 05/11/21

## 2021-05-15 ENCOUNTER — Encounter: Payer: Self-pay | Admitting: Physical Therapy

## 2021-05-15 ENCOUNTER — Other Ambulatory Visit: Payer: Self-pay

## 2021-05-15 ENCOUNTER — Ambulatory Visit: Payer: 59 | Admitting: Physical Therapy

## 2021-05-15 DIAGNOSIS — G8929 Other chronic pain: Secondary | ICD-10-CM

## 2021-05-15 DIAGNOSIS — M25511 Pain in right shoulder: Secondary | ICD-10-CM | POA: Diagnosis not present

## 2021-05-15 NOTE — Therapy (Signed)
[facility] [address], [place], [address] Phone: [phone]   Fax:  [phone]  Physical Therapy Treatment  Patient Details  Name: Craig Finley MRN: [id number] Date of Birth: Dec 02, 1960 Referring Provider (PT): Vanetta Mulders   Encounter Date: 05/15/2021   PT End of Session - 05/15/21 1115     Visit Number 4    Number of Visits 18    Date for PT Re-Evaluation 06/22/21    Authorization Type [employer]    Authorization - Visit Number 4    Authorization - Number of Visits 18    Progress Note Due on Visit 10    PT Start Time 1030    PT Stop Time 1108    PT Time Calculation (min) 38 min    Activity Tolerance Patient tolerated treatment well    Behavior During Therapy [facility] for tasks assessed/performed             Past Medical History:  Diagnosis Date   Allergy    Blood in stool    Chest pain    a. 11/2019 Cor CTA: Ca2 = 0. Nl cors.   CKD (chronic kidney disease) stage 2, GFR 60-89 ml/min    GERD (gastroesophageal reflux disease)    Heart murmur    as a child:   Hypertension    Pulmonary nodules    a. 11/2019 Cor CTA: Small L lung nodules - largest 48mm - incidental finding; b. 12/2020 Chest CT: Sm bilat pulm nodules - unchanged.    Past Surgical History:  Procedure Laterality Date   ANAL FISSURE REPAIR  05/1998   some type of anal fissure procedure   ARTHOSCOPIC ROTAOR CUFF REPAIR Right 04/23/2021   Procedure: Right ARTHROSCOPIC ROTATOR CUFF REPAIR;  Surgeon: Vanetta Mulders, MD;  Location: [place];  Service: Orthopedics;  Laterality: Right;   COLONOSCOPY WITH PROPOFOL N/A 01/31/2020   Procedure: COLONOSCOPY WITH PROPOFOL;  Surgeon: Lin Landsman, MD;  Location: [facility] ENDOSCOPY;  Service: Gastroenterology;  Laterality: N/A;   ESOPHAGOGASTRODUODENOSCOPY (EGD) WITH PROPOFOL N/A 01/31/2020   Procedure: ESOPHAGOGASTRODUODENOSCOPY (EGD) WITH PROPOFOL;  Surgeon: Lin Landsman,  MD;  Location: [facility] ENDOSCOPY;  Service: Gastroenterology;  Laterality: N/A;    There were no vitals filed for this visit.   Subjective Assessment - 05/15/21 1033     Subjective Pnt reports 4/10 pain in right shoulder and still unable to sleep    Pertinent History 61 y.o. male patient presents with acute R shoulder pain S/P right arthroscopic supraspinatus repair on 04/23/21. pnt describes pain in R shoulder as a constant pressure and staying at a 1/10 NPS since surgery. Pnt has 3 steps entering into home with bilateral railing- pnt reports no trouble with this. Pnt reports numbness in R thumb and aggrevating factors include sleeping positioning and taking arm out of sling. pnt has had prior physical therapy for his shoulder that he reported helped him. pnt currently sleeping in a recliner and wants to have better sleep- the general "stiffness" is keeping him awake at night. Easing factors include NSAIDs and ice which he applies 2x a day for approx 30 min. pnt goals include going back to playing softball (prior playing regularly on local team) and wanting to sleep comfortably. pnt denys weight change, B/B changes, n/v, fever, night pain, or falls within the last 6 months.    Limitations Lifting;House hold activities    How long can you sit comfortably? unlimited    How  long can you stand comfortably? unlimited    How long can you walk comfortably? unlimited    Diagnostic tests xray, unremarkable aside from age-typical arthrosis    Patient Stated Goals pnt would like to sleep comfortably and return to softball    Currently in Pain? Yes    Pain Score 4     Pain Location Shoulder    Pain Orientation Right    Pain Descriptors / Indicators Dull    Pain Type Acute pain;Surgical pain    Pain Onset 1 to 4 weeks ago    Pain Frequency Constant    Aggravating Factors  sleeping, taking arm out of sling    Pain Relieving Factors ice, NSAIDs    Effect of Pain on Daily Activities limits recreational  activity                Therex: Elbow flexion/extension x 12- good carryover from last session Elbow pronation/supination x 12- good carryover from last session Hand Grips with green foam 3 x 8- verbal cueing to hold for 5 seconds Finger pinches with green foam block 3 x 8- min verbal cue to hold for 5 seconds  Dark Blue Putty Gripping for 2 min  17 min total   Manual Therapy: Shoulder Flexion PROM 110 degrees Shoulder Abduction PROM approx 80 degrees Shoulder ER at side PROM approx 40 degrees 20 min total                        PT Education - 05/15/21 1101     Education Details therex    Person(s) Educated Patient    Methods Explanation;Demonstration;Verbal cues    Comprehension Verbalized understanding;Returned demonstration              PT Short Term Goals - 04/26/21 1227       PT SHORT TERM GOAL #1   Title Pt will demonstrate independence with HEP to improve R shoulder function for increased ability to participate with ADLs.    Baseline 04/26/21 HEP given    Time 4    Period Weeks    Status New    Target Date 05/24/21               PT Long Term Goals - 04/26/21 1228       PT LONG TERM GOAL #1   Title Pnt will increase R shoulder abduction and flexion ROM to >170 degrees in order to complete functional ADLs    Baseline 04/26/21 unable to access    Time 8    Period Weeks    Status New    Target Date 06/21/21      PT LONG TERM GOAL #2   Title Pnt will be able to comeplete Apley scratch test on R shoulder and touch inferior border of left scapula to show clinically significant increase in IR to shower independently.    Baseline 04/26/21 unable to access    Time 8    Period Weeks    Status New    Target Date 06/21/21      PT LONG TERM GOAL #3   Title pnt will increase R grip dynomometer score by to 85# to demonstrate PLOFstrength evidenced by uninvolved side needed for heavy household tasks    Baseline 04/26/21 R: 25lbs    Time  8    Period Weeks    Status New    Target Date 06/21/21      PT LONG TERM GOAL #4  Title Pt will demonstrate gross R shoulder and elbow strength of 5/5 in order to demonstrate PLOF strength for heavy household ADLs and return to sport    Baseline 04/26/21 no test performed    Time 8    Period Weeks    Status New                   Plan - 05/15/21 1116     Clinical Impression Statement Pnt is currently 4 weeks post opt and presents with minimal pain in right shoulder (4/10 on NPS.) SPT continued therex from last session including AROM elbow motion, grip strength with putty and foam, and gross PROM shoulder motion as per sugical protocol. Pnt tolerated treatment well with no aggravation in symptoms. Pnt maintained good motivation throughout session and is eager to start more strengthing exercises. SPT reviewed surgical protocols and timeline for progression, and pnt verbalized understanding of SPT direction and protocol. Pnt maintianed good motivation  throughout session, and PT will progress to [facility] and scapular strengthing next session.    Personal Factors and Comorbidities Past/Current Experience;Age    Examination-Activity Limitations Carry;Lift;Bed Mobility;Sleep;Dressing;Reach Overhead;Hygiene/Grooming    Examination-Participation Restrictions Driving;Cleaning;Meal Prep;Yard Work;Community Activity;Laundry;Occupation    Stability/Clinical Decision Making Evolving/Moderate complexity    Clinical Decision Making Moderate    Rehab Potential Good    PT Frequency 2x / week    PT Duration 8 weeks    PT Treatment/Interventions ADLs/Self Care Home Management;Cryotherapy;Electrical Stimulation;Iontophoresis 4mg /ml Dexamethasone;Moist Heat;Traction;Ultrasound;Functional mobility training;Therapeutic activities;Neuromuscular re-education;Therapeutic exercise;Patient/family education;Manual techniques;Passive range of motion;Dry needling;Splinting;Scar mobilization;Spinal Manipulations;Joint  Manipulations    PT Next Visit Plan ROM/strengthening    PT Home Exercise Plan Elbow flexion/extension, gripping, pendulums    Consulted and Agree with Plan of Care Patient             Patient will benefit from skilled therapeutic intervention in order to improve the following deficits and impairments:  Decreased activity tolerance, Decreased endurance, Decreased range of motion, Decreased strength, Hypomobility, Increased fascial restricitons, Impaired UE functional use, Pain, Decreased coordination, Decreased mobility, Postural dysfunction  Visit Diagnosis: Chronic right shoulder pain     Problem List Patient Active Problem List   Diagnosis Date Noted   Complete tear of right rotator cuff 03/22/2021   Right shoulder pain 01/30/2021   Primary hypertension 01/30/2021   Non-seasonal allergic rhinitis 02/14/2020   Chest pain, non-cardiac 01/07/2020   Pulmonary nodules 01/07/2020   Essential hypertension 12/09/2019   Elevated serum creatinine 11/27/2019   Elevated BP without diagnosis of hypertension 11/27/2019   Breast pain, left 11/27/2019   Rectal bleeding 11/04/2019   Gastroesophageal reflux disease 11/04/2019   Chest pain 11/04/2019   SOB (shortness of breath) 11/04/2019   BMI 30.0-30.9,adult 11/04/2019   Encounter for HCV screening test for low risk patient 11/04/2019   Screening for HIV (human immunodeficiency virus) 11/04/2019   Encounter for screening and preventative care 11/04/2019   Tobacco chew use 11/04/2019   Claiborne Billings O'Daniel, SPT  Patrina Levering PT, DPT   [facility] [address] [place], [address] Phone: [phone]   Fax:  [phone]  Name: Makeen Greenough MRN: [id number] Date of Birth: 1960-09-22

## 2021-05-22 ENCOUNTER — Ambulatory Visit: Payer: 59 | Admitting: Physical Therapy

## 2021-05-22 ENCOUNTER — Encounter: Payer: Self-pay | Admitting: Physical Therapy

## 2021-05-22 ENCOUNTER — Other Ambulatory Visit: Payer: Self-pay

## 2021-05-22 DIAGNOSIS — M25511 Pain in right shoulder: Secondary | ICD-10-CM | POA: Diagnosis not present

## 2021-05-22 DIAGNOSIS — G8929 Other chronic pain: Secondary | ICD-10-CM

## 2021-05-22 NOTE — Therapy (Signed)
[facility] [address] [place], [address] Phone: [phone]   Fax:  [phone]  Physical Therapy Treatment  Patient Details  Name: Craig Finley MRN: [id number] Date of Birth: [birth year] Referring Provider (PT): Huel Cote   Encounter Date: 05/22/2021   PT End of Session - 05/22/21 1112     Visit Number 5    Number of Visits 17    Date for PT Re-Evaluation 06/22/21    Authorization Type [employer] Generic    Authorization - Visit Number --    Authorization - Number of Visits --    Progress Note Due on Visit 10    PT Start Time 1040    PT Stop Time 1115    PT Time Calculation (min) 35 min    Activity Tolerance Patient tolerated treatment well    Behavior During Therapy [facility] for tasks assessed/performed             Past Medical History:  Diagnosis Date   Allergy    Blood in stool    Chest pain    a. 11/2019 Cor CTA: Ca2 = 0. Nl cors.   CKD (chronic kidney disease) stage 2, GFR 60-89 ml/min    GERD (gastroesophageal reflux disease)    Heart murmur    as a child:   Hypertension    Pulmonary nodules    a. 11/2019 Cor CTA: Small L lung nodules - largest 27mm - incidental finding; b. 12/2020 Chest CT: Sm bilat pulm nodules - unchanged.    Past Surgical History:  Procedure Laterality Date   ANAL FISSURE REPAIR  05/1998   some type of anal fissure procedure   ARTHOSCOPIC ROTAOR CUFF REPAIR Right 04/23/2021   Procedure: Right ARTHROSCOPIC ROTATOR CUFF REPAIR;  Surgeon: Huel Cote, MD;  Location: [facility] OR;  Service: Orthopedics;  Laterality: Right;   COLONOSCOPY WITH PROPOFOL N/A 01/31/2020   Procedure: COLONOSCOPY WITH PROPOFOL;  Surgeon: Toney Reil, MD;  Location: [facility] ENDOSCOPY;  Service: Gastroenterology;  Laterality: N/A;   ESOPHAGOGASTRODUODENOSCOPY (EGD) WITH PROPOFOL N/A 01/31/2020   Procedure: ESOPHAGOGASTRODUODENOSCOPY (EGD) WITH PROPOFOL;  Surgeon: Toney Reil, MD;  Location: [facility] ENDOSCOPY;  Service: Gastroenterology;  Laterality: N/A;    There were no vitals filed for this visit.   Subjective Assessment - 05/22/21 1052     Subjective Pnt reports 3/10 pain in right shoulder and being compliant with HEP.    Pertinent History 61 y.o. male patient presents with acute R shoulder pain S/P right arthroscopic supraspinatus repair on 04/23/21. pnt describes pain in R shoulder as a constant pressure and staying at a 1/10 NPS since surgery. Pnt has 3 steps entering into home with bilateral railing- pnt reports no trouble with this. Pnt reports numbness in R thumb and aggrevating factors include sleeping positioning and taking arm out of sling. pnt has had prior physical therapy for his shoulder that he reported helped him. pnt currently sleeping in a recliner and wants to have better sleep- the general "stiffness" is keeping him awake at night. Easing factors include NSAIDs and ice which he applies 2x a day for approx 30 min. pnt goals include going back to playing softball (prior playing regularly on local team) and wanting to sleep comfortably. pnt denys weight change, B/B changes, n/v, fever, night pain, or falls within the last 6 months.    Limitations Lifting;House hold activities    How long can you sit comfortably? unlimited    How  long can you stand comfortably? unlimited    How long can you walk comfortably? unlimited    Diagnostic tests xray, unremarkable aside from age-typical arthrosis    Patient Stated Goals pnt would like to sleep comfortably and return to softball    Currently in Pain? Yes    Pain Score 3     Pain Location Shoulder    Pain Orientation Right    Pain Descriptors / Indicators Dull    Pain Type Acute pain;Surgical pain    Pain Radiating Towards none    Pain Onset 1 to 4 weeks ago    Pain Frequency Constant              Manual  PROM flexion- approx. 130 degrees with end range stretching PROM abduction- approx. 80  degrees with end range stretching PROM ER- approx. 40 at side  10 min Total   Therex Seated AAROM shoulder flexion with assistance from left arm 3 x 8- mod cueing for true active assist  Seated AAROM shoulder abduction with assistance from left arm 3 x 8- mod cueing for true active assist  Scapular retraction seated 2 x 8 Seated shoulder shrugs 3 x 8         PT Education - 05/22/21 1112     Education Details therex form and surgical protocol    Person(s) Educated Patient    Methods Explanation;Demonstration;Verbal cues    Comprehension Verbalized understanding;Returned demonstration              PT Short Term Goals - 04/26/21 1227       PT SHORT TERM GOAL #1   Title Pt will demonstrate independence with HEP to improve R shoulder function for increased ability to participate with ADLs.    Baseline 04/26/21 HEP given    Time 4    Period Weeks    Status New    Target Date 05/24/21               PT Long Term Goals - 04/26/21 1228       PT LONG TERM GOAL #1   Title Pnt will increase R shoulder abduction and flexion ROM to >170 degrees in order to complete functional ADLs    Baseline 04/26/21 unable to access    Time 8    Period Weeks    Status New    Target Date 06/21/21      PT LONG TERM GOAL #2   Title Pnt will be able to comeplete Apley scratch test on R shoulder and touch inferior border of left scapula to show clinically significant increase in IR to shower independently.    Baseline 04/26/21 unable to access    Time 8    Period Weeks    Status New    Target Date 06/21/21      PT LONG TERM GOAL #3   Title pnt will increase R grip dynomometer score by to 85# to demonstrate PLOFstrength evidenced by uninvolved side needed for heavy household tasks    Baseline 04/26/21 R: 25lbs    Time 8    Period Weeks    Status New    Target Date 06/21/21      PT LONG TERM GOAL #4   Title Pt will demonstrate gross R shoulder and elbow strength of 5/5 in order to  demonstrate PLOF strength for heavy household ADLs and return to sport    Baseline 04/26/21 no test performed    Time 8    Period  Weeks    Status New                   Plan - 05/22/21 1120     Clinical Impression Statement Pnt was 10 minutes late to session and presented with 3/10 pain on NPS in right shoulder and is 5 weeks post op. SPT performed manual therapy to increase multidirectional shoulder ROM with mild passive stretching at end range. Pnt stated he has started [facility] independently at home, and SPT supervised this motion within session and advised pnt to decrease his use of the post-op sling per his comfort level. Pnt verbalized understanding. Therex progressed to include light nonresistance parascapular strengthing as per surgical protocol. Pnt is highly motivated to progress to more active assisted motions. PT will continue as able    Personal Factors and Comorbidities Past/Current Experience;Age    Examination-Activity Limitations Carry;Lift;Bed Mobility;Sleep;Dressing;Reach Overhead;Hygiene/Grooming    Examination-Participation Restrictions Driving;Cleaning;Meal Prep;Yard Work;Community Activity;Laundry;Occupation    Stability/Clinical Decision Making Evolving/Moderate complexity    Clinical Decision Making Moderate    Rehab Potential Good    PT Frequency 2x / week    PT Duration 8 weeks    PT Treatment/Interventions ADLs/Self Care Home Management;Cryotherapy;Electrical Stimulation;Iontophoresis 4mg /ml Dexamethasone;Moist Heat;Traction;Ultrasound;Functional mobility training;Therapeutic activities;Neuromuscular re-education;Therapeutic exercise;Patient/family education;Manual techniques;Passive range of motion;Dry needling;Splinting;Scar mobilization;Spinal Manipulations;Joint Manipulations    PT Next Visit Plan ROM, AAROM    PT Home Exercise Plan Elbow flexion/extension, gripping, pendulums    Consulted and Agree with Plan of Care Patient             Patient will  benefit from skilled therapeutic intervention in order to improve the following deficits and impairments:  Decreased activity tolerance, Decreased endurance, Decreased range of motion, Decreased strength, Hypomobility, Increased fascial restricitons, Impaired UE functional use, Pain, Decreased coordination, Decreased mobility, Postural dysfunction  Visit Diagnosis: Chronic right shoulder pain     Problem List Patient Active Problem List   Diagnosis Date Noted   Complete tear of right rotator cuff 03/22/2021   Right shoulder pain 01/30/2021   Primary hypertension 01/30/2021   Non-seasonal allergic rhinitis 02/14/2020   Chest pain, non-cardiac 01/07/2020   Pulmonary nodules 01/07/2020   Essential hypertension 12/09/2019   Elevated serum creatinine 11/27/2019   Elevated BP without diagnosis of hypertension 11/27/2019   Breast pain, left 11/27/2019   Rectal bleeding 11/04/2019   Gastroesophageal reflux disease 11/04/2019   Chest pain 11/04/2019   SOB (shortness of breath) 11/04/2019   BMI 30.0-30.9,adult 11/04/2019   Encounter for HCV screening test for low risk patient 11/04/2019   Screening for HIV (human immunodeficiency virus) 11/04/2019   Encounter for screening and preventative care 11/04/2019   Tobacco chew use 11/04/2019   Claiborne Billings O'Daniel, SPT  Patrina Levering PT, DPT   [place] [facility]IC[facility] [address] [place], [address] Phone: [phone]   Fax:  [phone]  Name: Jmari Dimler MRN: [id number] Date of Birth: 06-15-1960

## 2021-05-23 ENCOUNTER — Encounter (HOSPITAL_BASED_OUTPATIENT_CLINIC_OR_DEPARTMENT_OTHER): Payer: Self-pay | Admitting: Orthopaedic Surgery

## 2021-05-23 ENCOUNTER — Telehealth: Payer: Self-pay | Admitting: Orthopaedic Surgery

## 2021-05-23 NOTE — Telephone Encounter (Signed)
Pt states that she is not going to be able to go back to work on 03/06. She said it has been only 6 weeks since surgery so she needs another note stating that she can be out from work.  ? ?Cb [phone] ?

## 2021-05-29 ENCOUNTER — Other Ambulatory Visit: Payer: Self-pay

## 2021-05-29 ENCOUNTER — Ambulatory Visit: Payer: 59 | Attending: Internal Medicine | Admitting: Physical Therapy

## 2021-05-29 DIAGNOSIS — M25511 Pain in right shoulder: Secondary | ICD-10-CM | POA: Diagnosis not present

## 2021-05-29 DIAGNOSIS — G8929 Other chronic pain: Secondary | ICD-10-CM | POA: Diagnosis present

## 2021-05-29 NOTE — Therapy (Signed)
[place] [facility] [address] [place], [address] Phone: [phone]   Fax:  [phone]  Physical Therapy Treatment  Patient Details  Name: Craig Finley MRN: [id number] Date of Birth: [birth year] Referring Provider (PT): Vanetta Mulders   Encounter Date: 05/29/2021   PT End of Session - 05/29/21 1605     Visit Number 6    Number of Visits 17    Date for PT Re-Evaluation 06/22/21    Authorization Type [employer] Generic    Progress Note Due on Visit 10    PT Start Time 1521    PT Stop Time 1602    PT Time Calculation (min) 41 min    Activity Tolerance Patient tolerated treatment well    Behavior During Therapy [facility] for tasks assessed/performed             Past Medical History:  Diagnosis Date   Allergy    Blood in stool    Chest pain    a. 11/2019 Cor CTA: Ca2 = 0. Nl cors.   CKD (chronic kidney disease) stage 2, GFR 60-89 ml/min    GERD (gastroesophageal reflux disease)    Heart murmur    as a child:   Hypertension    Pulmonary nodules    a. 11/2019 Cor CTA: Small L lung nodules - largest 60mm - incidental finding; b. 12/2020 Chest CT: Sm bilat pulm nodules - unchanged.    Past Surgical History:  Procedure Laterality Date   ANAL FISSURE REPAIR  05/1998   some type of anal fissure procedure   ARTHOSCOPIC ROTAOR CUFF REPAIR Right 04/23/2021   Procedure: Right ARTHROSCOPIC ROTATOR CUFF REPAIR;  Surgeon: Vanetta Mulders, MD;  Location: [place];  Service: Orthopedics;  Laterality: Right;   COLONOSCOPY WITH PROPOFOL N/A 01/31/2020   Procedure: COLONOSCOPY WITH PROPOFOL;  Surgeon: Lin Landsman, MD;  Location: [facility] ENDOSCOPY;  Service: Gastroenterology;  Laterality: N/A;   ESOPHAGOGASTRODUODENOSCOPY (EGD) WITH PROPOFOL N/A 01/31/2020   Procedure: ESOPHAGOGASTRODUODENOSCOPY (EGD) WITH PROPOFOL;  Surgeon: Lin Landsman, MD;  Location: [facility] ENDOSCOPY;  Service: Gastroenterology;  Laterality:  N/A;    There were no vitals filed for this visit.   Subjective Assessment - 05/29/21 1523     Subjective Patient states he is well today. Reports consistent 3/10 pain. States he did not wear the sling for lost of the day yesterday and thinks it may have aggravated some pain.    Pertinent History 61 y.o. male patient presents with acute R shoulder pain S/P right arthroscopic supraspinatus repair on 04/23/21. pnt describes pain in R shoulder as a constant pressure and staying at a 1/10 NPS since surgery. Pnt has 3 steps entering into home with bilateral railing- pnt reports no trouble with this. Pnt reports numbness in R thumb and aggrevating factors include sleeping positioning and taking arm out of sling. pnt has had prior physical therapy for his shoulder that he reported helped him. pnt currently sleeping in a recliner and wants to have better sleep- the general "stiffness" is keeping him awake at night. Easing factors include NSAIDs and ice which he applies 2x a day for approx 30 min. pnt goals include going back to playing softball (prior playing regularly on local team) and wanting to sleep comfortably. pnt denys weight change, B/B changes, n/v, fever, night pain, or falls within the last 6 months.    Limitations Lifting;House hold activities    How long can you sit comfortably? unlimited  How long can you stand comfortably? unlimited    How long can you walk comfortably? unlimited    Diagnostic tests xray, unremarkable aside from age-typical arthrosis    Patient Stated Goals pnt would like to sleep comfortably and return to softball    Currently in Pain? Yes    Pain Score 3     Pain Location Shoulder    Pain Orientation Right    Pain Onset 1 to 4 weeks ago            **R SHOULDER**  INTERVENTIONS  Manual  PROM flexion, abduction and ER x8 minutes     Therex Pulleys: AAROM flexion and abduction, 3 minutes each Supine AAROM using cane:  -flexion, 2x10  -abduction, 2x10   -ER, 2x10 Supine scapular retraction, 2x10    Clinical Impression: Pt is pleasant and motivated throughout session. Pt is now 6 weeks post-op. Per protocol, AAROM using pulleys and the cane was initiated. Pt was able to achieve greater ROM via AAROM with less guarding than initial range during PROM at beginning of session. Pt reported discomfort at end range of movements however no sharp pain. He tolerated increase in activity well. Pulleys were provided to for patient to take home and use with HEP.  Education on desired range and indications to stop exercise. PT plans to increase frequency of PT to 2x/week now that pt is able to participate in [facility] - pt agreeable. 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            PT Short Term Goals - 04/26/21 1227       PT SHORT TERM GOAL #1   Title Pt will demonstrate independence with HEP to improve R shoulder function for increased ability to participate with ADLs.    Baseline 04/26/21 HEP given    Time 4    Period Weeks    Status New    Target Date 05/24/21               PT Long Term Goals - 04/26/21 1228       PT LONG TERM GOAL #1   Title Pnt will increase R shoulder abduction and flexion ROM to >170 degrees in order to complete functional ADLs    Baseline 04/26/21 unable to access    Time 8    Period Weeks    Status New    Target Date 06/21/21      PT LONG TERM GOAL #2   Title Pnt will be able to comeplete Apley scratch test on R shoulder and touch inferior border of left scapula to show clinically significant increase in IR to shower independently.    Baseline 04/26/21 unable to access    Time 8    Period Weeks    Status New    Target Date 06/21/21      PT LONG TERM GOAL #3   Title pnt will increase R grip dynomometer score by to 85# to demonstrate PLOFstrength evidenced by uninvolved side needed for heavy household  tasks    Baseline 04/26/21 R: 25lbs    Time 8    Period Weeks    Status New    Target Date 06/21/21      PT LONG TERM GOAL #4   Title Pt will demonstrate gross R shoulder and elbow strength of 5/5 in order to demonstrate PLOF strength for heavy household  ADLs and return to sport    Baseline 04/26/21 no test performed    Time 8    Period Weeks    Status New                   Plan - 05/29/21 1607     Clinical Impression Statement Pt is pleasant and motivated throughout session. Pt is now 6 weeks post-op. Per protocol, AAROM using pulleys and the cane was initiated. Pt was able to achieve greater ROM via AAROM with less guarding than initial range during PROM at beginning of session. Pt reported discomfort at end range of movements however no sharp pain. He tolerated increase in activity well. Pulleys were provided to for patient to take home and use with HEP.  Education on desired range and indications to stop exercise. PT plans to increase frequency of PT to 2x/week now that pt is able to participate in [facility] - pt agreeable. 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    Personal Factors and Comorbidities Past/Current Experience;Age    Examination-Activity Limitations Carry;Lift;Bed Mobility;Sleep;Dressing;Reach Overhead;Hygiene/Grooming    Examination-Participation Restrictions Driving;Cleaning;Meal Prep;Yard Work;Community Activity;Laundry;Occupation    Stability/Clinical Decision Making Evolving/Moderate complexity    Rehab Potential Good    PT Frequency 2x / week    PT Duration 8 weeks    PT Treatment/Interventions ADLs/Self Care Home Management;Cryotherapy;Electrical Stimulation;Iontophoresis 4mg /ml Dexamethasone;Moist Heat;Traction;Ultrasound;Functional mobility training;Therapeutic activities;Neuromuscular re-education;Therapeutic exercise;Patient/family education;Manual  techniques;Passive range of motion;Dry needling;Splinting;Scar mobilization;Spinal Manipulations;Joint Manipulations    PT Next Visit Plan ROM, AAROM    PT Home Exercise Plan Elbow flexion/extension, gripping, pendulums    Consulted and Agree with Plan of Care Patient             Patient will benefit from skilled therapeutic intervention in order to improve the following deficits and impairments:  Decreased activity tolerance, Decreased endurance, Decreased range of motion, Decreased strength, Hypomobility, Increased fascial restricitons, Impaired UE functional use, Pain, Decreased coordination, Decreased mobility, Postural dysfunction  Visit Diagnosis: Chronic right shoulder pain     Problem List Patient Active Problem List   Diagnosis Date Noted   Complete tear of right rotator cuff 03/22/2021   Right shoulder pain 01/30/2021   Primary hypertension 01/30/2021   Non-seasonal allergic rhinitis 02/14/2020   Chest pain, non-cardiac 01/07/2020   Pulmonary nodules 01/07/2020   Essential hypertension 12/09/2019   Elevated serum creatinine 11/27/2019   Elevated BP without diagnosis of hypertension 11/27/2019   Breast pain, left 11/27/2019   Rectal bleeding 11/04/2019   Gastroesophageal reflux disease 11/04/2019   Chest pain 11/04/2019   SOB (shortness of breath) 11/04/2019   BMI 30.0-30.9,adult 11/04/2019   Encounter for HCV screening test for low risk patient 11/04/2019   Screening for HIV (human immunodeficiency virus) 11/04/2019   Encounter for screening and preventative care 11/04/2019   Tobacco chew use 11/04/2019    Patrina Levering PT, DPT   [place] [facility] [address] [place], [address] Phone: [phone]   Fax:  [phone]  Name: Craig Finley MRN: [id number] Date of Birth: 05-10-1960

## 2021-05-31 ENCOUNTER — Ambulatory Visit: Payer: 59 | Admitting: Physical Therapy

## 2021-05-31 ENCOUNTER — Encounter: Payer: Self-pay | Admitting: Physical Therapy

## 2021-05-31 DIAGNOSIS — M25511 Pain in right shoulder: Secondary | ICD-10-CM

## 2021-05-31 NOTE — Therapy (Signed)
[facility] [address], [place], [address] Phone: [phone]   Fax:  [phone]  Physical Therapy Treatment  Patient Details  Name: Craig Finley MRN: [id number] Date of Birth: Aug 01, 1960 Referring Provider (PT): Vanetta Mulders   Encounter Date: 05/31/2021   PT End of Session - 05/31/21 1533     Visit Number 7    Number of Visits 17    Date for PT Re-Evaluation 06/22/21    Authorization Type [employer]    Progress Note Due on Visit 10    PT Start Time 1300    PT Stop Time 1345    PT Time Calculation (min) 45 min    Activity Tolerance Patient tolerated treatment well    Behavior During Therapy [facility] for tasks assessed/performed             Past Medical History:  Diagnosis Date   Allergy    Blood in stool    Chest pain    a. 11/2019 Cor CTA: Ca2 = 0. Nl cors.   CKD (chronic kidney disease) stage 2, GFR 60-89 ml/min    GERD (gastroesophageal reflux disease)    Heart murmur    as a child:   Hypertension    Pulmonary nodules    a. 11/2019 Cor CTA: Small L lung nodules - largest 75mm - incidental finding; b. 12/2020 Chest CT: Sm bilat pulm nodules - unchanged.    Past Surgical History:  Procedure Laterality Date   ANAL FISSURE REPAIR  05/1998   some type of anal fissure procedure   ARTHOSCOPIC ROTAOR CUFF REPAIR Right 04/23/2021   Procedure: Right ARTHROSCOPIC ROTATOR CUFF REPAIR;  Surgeon: Vanetta Mulders, MD;  Location: [facility];  Service: Orthopedics;  Laterality: Right;   COLONOSCOPY WITH PROPOFOL N/A 01/31/2020   Procedure: COLONOSCOPY WITH PROPOFOL;  Surgeon: Lin Landsman, MD;  Location: [facility] ENDOSCOPY;  Service: Gastroenterology;  Laterality: N/A;   ESOPHAGOGASTRODUODENOSCOPY (EGD) WITH PROPOFOL N/A 01/31/2020   Procedure: ESOPHAGOGASTRODUODENOSCOPY (EGD) WITH PROPOFOL;  Surgeon: Lin Landsman, MD;  Location: [facility] ENDOSCOPY;  Service: Gastroenterology;  Laterality:  N/A;    There were no vitals filed for this visit.   Subjective Assessment - 05/31/21 1310     Subjective Patient states he is well today. States he may have overdone the exercises with his shoulder as he has performed pulleys multiple times since last session and AAROM cane exercises on both wednesday and this morning. Reports consisten 3/10 pain. Has difficulty falling alseep due to finding a comfortable position.    Pertinent History 61 y.o. male patient presents with acute R shoulder pain S/P right arthroscopic supraspinatus repair on 04/23/21. pnt describes pain in R shoulder as a constant pressure and staying at a 1/10 NPS since surgery. Pnt has 3 steps entering into home with bilateral railing- pnt reports no trouble with this. Pnt reports numbness in R thumb and aggrevating factors include sleeping positioning and taking arm out of sling. pnt has had prior physical therapy for his shoulder that he reported helped him. pnt currently sleeping in a recliner and wants to have better sleep- the general "stiffness" is keeping him awake at night. Easing factors include NSAIDs and ice which he applies 2x a day for approx 30 min. pnt goals include going back to playing softball (prior playing regularly on local team) and wanting to sleep comfortably. pnt denys weight change, B/B changes, n/v, fever, night pain, or falls within the last 6 months.  Limitations Lifting;House hold activities    How long can you sit comfortably? unlimited    How long can you stand comfortably? unlimited    How long can you walk comfortably? unlimited    Diagnostic tests xray, unremarkable aside from age-typical arthrosis    Patient Stated Goals pnt would like to sleep comfortably and return to softball    Currently in Pain? Yes    Pain Score 3     Pain Location Shoulder    Pain Orientation Right    Pain Descriptors / Indicators Dull    Pain Type Acute pain    Pain Onset 1 to 4 weeks ago               **R  SHOULDER**   INTERVENTIONS   Manual  PROM flexion, abduction, ER, D1 and D2  x15 minutes     Therex Pulleys: AAROM flexion and abduction, 4 minutes each Supine AAROM using cane:  -chest press, 3x10 Supine scapular retraction with 5 second hold, 3x10 Bicep curl 2#, 3x10   HEP including AAROM exercises created; handout provided to patient.  Access Code: [email or web address]  Exercises Seated Shoulder Flexion AAROM with Pulley Behind - 1 x daily - 7 x weekly - 3 minutes duration Seated Shoulder Abduction AAROM with Pulley Behind - 1 x daily - 7 x weekly - 3 minutes duration Supine Shoulder Flexion Extension AAROM with Dowel - 1 x daily - 7 x weekly - 3 sets - 10 reps Supine Shoulder Abduction AAROM with Dowel - 1 x daily - 7 x weekly - 3 sets - 10 reps Supine Shoulder External Rotation with Dowel - 1 x daily - 7 x weekly - 3 sets - 10 reps Supine Shoulder Press AAROM in Abduction with Dowel - 1 x daily - 7 x weekly - 3 sets - 10 reps Seated Scapular Retraction - 1 x daily - 7 x weekly - 3 sets - 10 reps - 5 seconds hold Standing Bicep Curls Neutral with Dumbbells - 1 x daily - 7 x weekly - 3 sets - 10 reps - 2# weight       Clinical Impression: Pt is pleasant and motivated throughout session. AAROM using pulley's and cane was continued. Pt did perform all cane exercises earlier this morning; different exercises were performed including scapular and biceps strengthening as to not overdo the shoulder. PT educated pt on frequency of performing HEP. 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 Flexion and scaption ROM are progressing nicely. Abduction ROM remains most limited. 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         PT Short Term Goals -  04/26/21 1227       PT SHORT TERM GOAL #1   Title Pt will demonstrate independence with HEP to improve R shoulder function for increased ability to participate with ADLs.    Baseline 04/26/21 HEP given    Time 4    Period Weeks    Status New    Target Date 05/24/21               PT Long Term Goals - 04/26/21 1228       PT LONG TERM GOAL #1   Title Pnt will  increase R shoulder abduction and flexion ROM to >170 degrees in order to complete functional ADLs    Baseline 04/26/21 unable to access    Time 8    Period Weeks    Status New    Target Date 06/21/21      PT LONG TERM GOAL #2   Title Pnt will be able to comeplete Apley scratch test on R shoulder and touch inferior border of left scapula to show clinically significant increase in IR to shower independently.    Baseline 04/26/21 unable to access    Time 8    Period Weeks    Status New    Target Date 06/21/21      PT LONG TERM GOAL #3   Title pnt will increase R grip dynomometer score by to 85# to demonstrate PLOFstrength evidenced by uninvolved side needed for heavy household tasks    Baseline 04/26/21 R: 25lbs    Time 8    Period Weeks    Status New    Target Date 06/21/21      PT LONG TERM GOAL #4   Title Pt will demonstrate gross R shoulder and elbow strength of 5/5 in order to demonstrate PLOF strength for heavy household ADLs and return to sport    Baseline 04/26/21 no test performed    Time 8    Period Weeks    Status New                   Plan - 05/31/21 1535     Clinical Impression Statement Pt is pleasant and motivated throughout session. AAROM using pulley's and cane was continued. Pt did perform all cane exercises earlier this morning; different exercises were performed including scapular and biceps strengthening as to not overdo the shoulder. PT educated pt on frequency of performing HEP. 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 Flexion and scaption ROM are progressing nicely. Abduction ROM remains most limited. 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    Personal Factors and Comorbidities Past/Current Experience;Age    Examination-Activity Limitations Carry;Lift;Bed Mobility;Sleep;Dressing;Reach Overhead;Hygiene/Grooming    Examination-Participation Restrictions Driving;Cleaning;Meal Prep;Yard Work;Community Activity;Laundry;Occupation    Stability/Clinical Decision Making Evolving/Moderate complexity    Rehab Potential Good    PT Frequency 2x / week    PT Duration 8 weeks    PT Treatment/Interventions ADLs/Self Care Home Management;Cryotherapy;Electrical Stimulation;Iontophoresis 4mg /ml Dexamethasone;Moist Heat;Traction;Ultrasound;Functional mobility training;Therapeutic activities;Neuromuscular re-education;Therapeutic exercise;Patient/family education;Manual techniques;Passive range of motion;Dry needling;Splinting;Scar mobilization;Spinal Manipulations;Joint Manipulations    PT Next Visit Plan ROM, AAROM    PT Home Exercise Plan Elbow flexion/extension, gripping, pendulums    Consulted and Agree with Plan of Care Patient             Patient will benefit from skilled therapeutic intervention in order to improve the following deficits and impairments:  Decreased activity tolerance, Decreased endurance, Decreased range of motion, Decreased strength, Hypomobility, Increased fascial restricitons, Impaired UE functional use, Pain, Decreased coordination, Decreased mobility, Postural dysfunction  Visit Diagnosis: Chronic right shoulder pain     Problem List Patient Active Problem List   Diagnosis Date Noted   Complete tear of right rotator cuff 03/22/2021   Right shoulder pain 01/30/2021   Primary hypertension 01/30/2021  Non-seasonal allergic rhinitis 02/14/2020   Chest pain, non-cardiac 01/07/2020   Pulmonary nodules 01/07/2020   Essential hypertension 12/09/2019   Elevated serum creatinine 11/27/2019   Elevated BP without diagnosis of hypertension 11/27/2019   Breast pain, left 11/27/2019   Rectal bleeding 11/04/2019   Gastroesophageal reflux disease 11/04/2019   Chest pain 11/04/2019   SOB (shortness of breath) 11/04/2019   BMI 30.0-30.9,adult 11/04/2019   Encounter for HCV screening test for low risk patient 11/04/2019   Screening for HIV (human immunodeficiency virus) 11/04/2019   Encounter for screening and preventative care 11/04/2019   Tobacco chew use 11/04/2019    Patrina Levering PT, DPT   [facility] [address], [place], [address] Phone: [phone]   Fax:  [phone]  Name: Glendell Wartman MRN: [id number] Date of Birth: 1960/09/11

## 2021-06-04 ENCOUNTER — Telehealth: Payer: Self-pay | Admitting: Orthopaedic Surgery

## 2021-06-04 ENCOUNTER — Other Ambulatory Visit: Payer: Self-pay

## 2021-06-04 ENCOUNTER — Ambulatory Visit (INDEPENDENT_AMBULATORY_CARE_PROVIDER_SITE_OTHER): Payer: 59 | Admitting: Orthopaedic Surgery

## 2021-06-04 ENCOUNTER — Other Ambulatory Visit (HOSPITAL_BASED_OUTPATIENT_CLINIC_OR_DEPARTMENT_OTHER): Payer: Self-pay

## 2021-06-04 DIAGNOSIS — S46011A Strain of muscle(s) and tendon(s) of the rotator cuff of right shoulder, initial encounter: Secondary | ICD-10-CM

## 2021-06-04 MED ORDER — ACETAMINOPHEN 500 MG PO TABS
500.0000 mg | ORAL_TABLET | Freq: Three times a day (TID) | ORAL | 0 refills | Status: AC
  Filled 2021-06-04: qty 30, 10d supply, fill #0

## 2021-06-04 MED ORDER — IBUPROFEN 800 MG PO TABS
800.0000 mg | ORAL_TABLET | Freq: Three times a day (TID) | ORAL | 0 refills | Status: AC
  Filled 2021-06-04: qty 30, 10d supply, fill #0

## 2021-06-04 NOTE — Progress Notes (Signed)
? ?                            ? ? ?Post Operative Evaluation ?  ? ?Procedure/Date of Surgery: Right shoulder rotator cuff repair done on April 23, 8169 ? ? ?61 year old male status post right rotator cuff repair overall presents as some stiffness today.  He is still having soreness and pain at night.  He is working with physical therapy and mentions progress to twice a week.  He has been working on some passive range of motion exercises although has been also working on biceps type exercises ? ? ?PMH/PSH/Family History/Social History/Meds/Allergies:   ? ?Past Medical History:  ?Diagnosis Date  ? Allergy   ? Blood in stool   ? Chest pain   ? a. 11/2019 Cor CTA: Ca2 = 0. Nl cors.  ? CKD (chronic kidney disease) stage 2, GFR 60-89 ml/min   ? GERD (gastroesophageal reflux disease)   ? Heart murmur   ? as a child:  ? Hypertension   ? Pulmonary nodules   ? a. 11/2019 Cor CTA: Small L lung nodules - largest 72mm - incidental finding; b. 12/2020 Chest CT: Sm bilat pulm nodules - unchanged.  ? ?Past Surgical History:  ?Procedure Laterality Date  ? ANAL FISSURE REPAIR  05/1998  ? some type of anal fissure procedure  ? ARTHOSCOPIC ROTAOR CUFF REPAIR Right 04/23/2021  ? Procedure: Right ARTHROSCOPIC ROTATOR CUFF REPAIR;  Surgeon: Vanetta Mulders, MD;  Location: [place];  Service: Orthopedics;  Laterality: Right;  ? COLONOSCOPY WITH PROPOFOL N/A 01/31/2020  ? Procedure: COLONOSCOPY WITH PROPOFOL;  Surgeon: Lin Landsman, MD;  Location: [facility] ENDOSCOPY;  Service: Gastroenterology;  Laterality: N/A;  ? ESOPHAGOGASTRODUODENOSCOPY (EGD) WITH PROPOFOL N/A 01/31/2020  ? Procedure: ESOPHAGOGASTRODUODENOSCOPY (EGD) WITH PROPOFOL;  Surgeon: Lin Landsman, MD;  Location: [facility] ENDOSCOPY;  Service: Gastroenterology;  Laterality: N/A;  ? ?Social History  ? ?Socioeconomic History  ? Marital status: Married  ?  Spouse name: Not on file  ? Number of children: Not on file  ? Years of education: Not on file  ? Highest education level: Some  college, no degree  ?Occupational History  ? Occupation: trucker  ?Tobacco Use  ? Smoking status: Never  ? Smokeless tobacco: Former  ?  Types: Snuff  ?  Quit date: 04/02/2021  ? Tobacco comments:  ?  dipping x 25-30 years  ?Vaping Use  ? Vaping Use: Never used  ?Substance and Sexual Activity  ? Alcohol use: Yes  ?  Comment: drinks 4-7 beers some days and none others. He drinks 24 12 oz can  beers per   ? Drug use: Never  ? Sexual activity: Yes  ?Other Topics Concern  ? Not on file  ?Social History Narrative  ? 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 ?Social Determinants of Health  ? ?Financial Resource Strain: Not on file  ?Food Insecurity: Not on file  ?Transportation Needs: Not on file  ?Physical Activity: Not on file  ?Stress: Not on file  ?Social Connections: Not on file  ? ?Family History  ?Problem Relation Age of Onset  ? Arthritis Mother   ? Asthma Mother   ? Hypertension Mother   ? Hearing loss Father   ? Alcohol abuse Sister   ? Cancer Sister   ? Depression Sister   ? Drug abuse Sister   ? Early death Sister   ? Heart  attack Sister   ? Hypertension Sister   ? Miscarriages / Stillbirths Sister   ? Asthma Sister   ? Depression Sister   ? Heart disease Maternal Uncle   ? Heart disease Maternal Uncle   ? Alcohol abuse Maternal Grandmother   ? Cancer Maternal Grandmother   ? Cancer Maternal Grandfather   ? Asthma Daughter   ? Asthma Son   ? ?No Known Allergies ?Current Outpatient Medications  ?Medication Sig Dispense Refill  ? acetaminophen (TYLENOL) 500 MG tablet Take 1,000 mg by mouth every 8 (eight) hours as needed for moderate pain.    ? amLODipine (NORVASC) 5 MG tablet Take 1 tablet (5 mg total) by mouth daily. 90 tablet 3  ? Fexofenadine-Pseudoephedrine (ALLEGRA-D PO) Take 1 tablet by mouth daily as needed (allergies).    ? omeprazole (PRILOSEC) 20 MG capsule Take 1 capsule (20 mg total) by mouth daily. (Patient taking differently: Take 20 mg by mouth every other day.) 30 capsule 3  ?  triamcinolone (NASACORT) 55 MCG/ACT AERO nasal inhaler Place 2 sprays into the nose daily. (Patient taking differently: Place 2 sprays into the nose daily as needed (allergies).) 1 each 4  ? ?No current facility-administered medications for this visit.  ? ?No results found. ? ?Review of Systems:   ?A ROS was performed including pertinent positives and negatives as documented in the HPI. ? ? ?Musculoskeletal Exam:   ? ?There were no vitals taken for this visit. ? ?Incisions are clean dry and intact.  No erythema or drainage.  Passive range of motion about right shoulder is to forward elevation 90.  External rotation at the side is to 45 sensation intact all distributions including axillary distribution 2+ radial pulse ? ?Imaging:   ? ?None ? ?I personally reviewed and interpreted the radiographs. ? ? ?Assessment:   ?61 year old male 6 weeks status post right shoulder rotator cuff repair.  Overall I have discussed that I would like his forward elevation to be better as he is currently at 90 but I like him to be closer to 120 degrees.  I will plan to see him back in 2 weeks and at that time if he is not in 120 degrees we will discuss a steroid injection in order to help improve his range of motion.  I have advised that I would like him to specifically take this request to his therapist that we can instill the importance of range of motion at this time as he is quite stiff prior to proceeding with significant strengthening ? ?Plan :   ? ?-Return to clinic in 2 weeks ? ? ? ? ?I personally saw and evaluated the patient, and participated in the management and treatment plan. ? ?Vanetta Mulders, MD ?Attending Physician, Orthopedic Surgery ? ?This document was dictated using Systems analyst. A reasonable attempt at proof reading has been made to minimize errors. ?

## 2021-06-04 NOTE — Telephone Encounter (Signed)
06/04/21 [facility] note faxed [employer] [phone] ?

## 2021-06-04 NOTE — Addendum Note (Signed)
Addended by: Benancio Deeds on: 06/04/2021 11:12 AM ? ? Modules accepted: Orders ? ?

## 2021-06-05 ENCOUNTER — Ambulatory Visit: Payer: 59 | Admitting: Physical Therapy

## 2021-06-05 DIAGNOSIS — M25511 Pain in right shoulder: Secondary | ICD-10-CM | POA: Diagnosis not present

## 2021-06-05 DIAGNOSIS — G8929 Other chronic pain: Secondary | ICD-10-CM

## 2021-06-05 NOTE — Therapy (Signed)
[place] [facility] ?[address] ?[place], [place], [address] ?Phone: [phone]   Fax:  [phone] ? ?Physical Therapy Treatment ? ?Patient Details  ?Name: Craig Finley [facility] ?MRN: [id number] ?Date of Birth: [birth year] ?Referring Provider (PT): Vanetta Mulders ? ? ?Encounter Date: 06/05/2021 ? ? PT End of Session - 06/05/21 1256   ? ? Visit Number 8   ? Number of Visits 17   ? Date for PT Re-Evaluation 06/22/21   ? Authorization Type [employer] Generic   ? Progress Note Due on Visit 10   ? PT Start Time 1137   ? PT Stop Time 1220   ? PT Time Calculation (min) 43 min   ? Activity Tolerance Patient tolerated treatment well   ? Behavior During Therapy [facility] for tasks assessed/performed   ? ?  ?  ? ?  ? ? ?Past Medical History:  ?Diagnosis Date  ? Allergy   ? Blood in stool   ? Chest pain   ? a. 11/2019 Cor CTA: Ca2 = 0. Nl cors.  ? CKD (chronic kidney disease) stage 2, GFR 60-89 ml/min   ? GERD (gastroesophageal reflux disease)   ? Heart murmur   ? as a child:  ? Hypertension   ? Pulmonary nodules   ? a. 11/2019 Cor CTA: Small L lung nodules - largest 52m - incidental finding; b. 12/2020 Chest CT: Sm bilat pulm nodules - unchanged.  ? ? ?Past Surgical History:  ?Procedure Laterality Date  ? ANAL FISSURE REPAIR  05/1998  ? some type of anal fissure procedure  ? ARTHOSCOPIC ROTAOR CUFF REPAIR Right 04/23/2021  ? Procedure: Right ARTHROSCOPIC ROTATOR CUFF REPAIR;  Surgeon: BVanetta Mulders MD;  [place] [facility]  Service: Orthopedics;  Laterality: Right;  ? COLONOSCOPY WITH PROPOFOL N/A 01/31/2020  ? Procedure: COLONOSCOPY WITH PROPOFOL;  Surgeon: VLin Landsman MD;  [facility]  Service: Gastroenterology;  Laterality: N/A;  ? ESOPHAGOGASTRODUODENOSCOPY (EGD) WITH PROPOFOL N/A 01/31/2020  ? Procedure: ESOPHAGOGASTRODUODENOSCOPY (EGD) WITH PROPOFOL;  Surgeon: VLin Landsman MD;  [facility];  Service: Gastroenterology;  Laterality:  N/A;  ? ? ?There were no vitals filed for this visit. ? ? Subjective Assessment - 06/05/21 1253   ? ? Subjective Patient states he is well today. He had a follow up with his surgeon yesterday who would like his to have more forward flexion.   ? Pertinent History 61y.o. male patient presents with acute R shoulder pain S/P right arthroscopic supraspinatus repair on 04/23/21. pnt describes pain in R shoulder as a constant pressure and staying at a 1/10 NPS since surgery. Pnt has 3 steps entering into home with bilateral railing- pnt reports no trouble with this. Pnt reports numbness in R thumb and aggrevating factors include sleeping positioning and taking arm out of sling. pnt has had prior physical therapy for his shoulder that he reported helped him. pnt currently sleeping in a recliner and wants to have better sleep- the general "stiffness" is keeping him awake at night. Easing factors include NSAIDs and ice which he applies 2x a day for approx 30 min. pnt goals include going back to playing softball (prior playing regularly on local team) and wanting to sleep comfortably. pnt denys weight change, B/B changes, n/v, fever, night pain, or falls within the last 6 months.   ? Limitations Lifting;House hold activities   ? How long can you sit comfortably? unlimited   ? How long can you stand comfortably?  unlimited   ? How long can you walk comfortably? unlimited   ? Diagnostic tests xray, unremarkable aside from age-typical arthrosis   ? Patient Stated Goals pnt would like to sleep comfortably and return to softball   ? Currently in Pain? Yes   ? Pain Score 3    ? Pain Location Shoulder   ? Pain Orientation Right   ? Pain Descriptors / Indicators Dull   ? Pain Onset 1 to 4 weeks ago   ? ?  ?  ? ?  ? ? ? ?**R SHOULDER** ?  ?INTERVENTIONS ?  ?Manual  ?PROM flexion, abduction, ER, D1 and D2  x10 minutes   ?  ?Therex ?Pulleys: AAROM flexion and abduction (straight arm), 4 minutes each ?AAROM using cane: ? -flexion in  supine, 3x10 ? -abduction in standing, 3x10. Used Geologist, engineering for Warden/ranger. ? -chest press in supine with scapular protraction, 3x10 ? ? ?Next session ?-measurements  ?  ?  ?Access Code: [email or web address] ?  ?Added standing AAROM abduction with mirror feedback. Modified hand placement during AAROM shoulder flexion to a more narrow grip for pure flexion.  ?  ?Standing Shoulder Abduction AAROM with Dowel - 1 x daily - 7 x weekly - 3 sets - 10 reps ? ? ? ?  ?Clinical Impression: Pt is pleasant and motivated throughout session. Pt met with surgeon who has asked PT to focus on forward flexion ROM. PROM with short holds at end range flexion and abduction to increase available ROM. AAROM using pulley's and cane was continued with modifications to obtain pure flexion rather than scaption. Flexion and scaption ROM are progressing nicely. Abduction ROM remains most limited. 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  ? ? ? ? ? ? ? ? ? ? PT Short Term Goals - 04/26/21 1227   ? ?  ? PT SHORT TERM GOAL #1  ? Title Pt will demonstrate independence with HEP to improve R shoulder function for increased ability to participate with ADLs.   ? Baseline 04/26/21 HEP given   ? Time 4   ? Period Weeks   ? Status New   ? Target Date 05/24/21   ? ?  ?  ? ?  ? ? ? ? PT Long Term Goals - 04/26/21 1228   ? ?  ? PT LONG TERM GOAL #1  ? Title Pnt will increase R shoulder abduction and flexion ROM to >170 degrees in order to complete functional ADLs   ? Baseline 04/26/21 unable to access   ? Time 8   ? Period Weeks   ? Status New   ? Target Date 06/21/21   ?  ? PT LONG TERM GOAL #2  ? Title Pnt will be able to comeplete Apley scratch test on R shoulder and touch inferior border of left scapula to show clinically significant increase in IR to shower independently.   ? Baseline 04/26/21 unable to access   ? Time 8   ? Period Weeks   ? Status New   ?  Target Date 06/21/21   ?  ? PT LONG TERM GOAL #3  ? Title pnt will increase R grip dynomometer score by to 85# to demonstrate PLOFstrength evidenced by uninvolved side needed for heavy household tasks   ? Baseline 04/26/21 R: 25lbs   ? Time 8   ? Period Weeks   ? Status  New   ? Target Date 06/21/21   ?  ? PT LONG TERM GOAL #4  ? Title Pt will demonstrate gross R shoulder and elbow strength of 5/5 in order to demonstrate PLOF strength for heavy household ADLs and return to sport   ? Baseline 04/26/21 no test performed   ? Time 8   ? Period Weeks   ? Status New   ? ?  ?  ? ?  ? ? ? ? ? ? ? ? Plan - 06/05/21 1256   ? ? Clinical Impression Statement Pt is pleasant and motivated throughout session. Pt met with surgeon who has asked PT to focus on forward flexion ROM. PROM with short holds at end range flexion and abduction to increase available ROM. AAROM using pulley's and cane was continued with modifications to obtain pure flexion rather than scaption. Flexion and scaption ROM are progressing nicely. Abduction ROM remains most limited. 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   ? Personal Factors and Comorbidities Past/Current Experience;Age   ? Examination-Activity Limitations Carry;Lift;Bed Mobility;Sleep;Dressing;Reach Overhead;Hygiene/Grooming   ? Examination-Participation Restrictions Driving;Cleaning;Meal Prep;Yard Work;Community Activity;Laundry;Occupation   ? Stability/Clinical Decision Making Evolving/Moderate complexity   ? Rehab Potential Good   ? PT Frequency 2x / week   ? PT Duration 8 weeks   ? PT Treatment/Interventions ADLs/Self Care Home Management;Cryotherapy;Electrical Stimulation;Iontophoresis 55m/ml Dexamethasone;Moist Heat;Traction;Ultrasound;Functional mobility training;Therapeutic activities;Neuromuscular re-education;Therapeutic exercise;Patient/family education;Manual techniques;Passive  range of motion;Dry needling;Splinting;Scar mobilization;Spinal Manipulations;Joint Manipulations   ? Consulted and Agree with Plan of Care Patient   ? ?  ?  ? ?  ? ? ?Patient will benefit from skilled therapeutic

## 2021-06-08 ENCOUNTER — Ambulatory Visit: Payer: 59

## 2021-06-08 ENCOUNTER — Other Ambulatory Visit: Payer: Self-pay

## 2021-06-08 DIAGNOSIS — G8929 Other chronic pain: Secondary | ICD-10-CM

## 2021-06-08 DIAGNOSIS — M25511 Pain in right shoulder: Secondary | ICD-10-CM | POA: Diagnosis not present

## 2021-06-08 NOTE — Therapy (Signed)
[place] ?[facility] [facility] ?[address] ?[place], [place], [address] ?Phone: [phone]   Fax:  [phone] ? ?Physical Therapy Treatment ? ?Patient Details  ?Name: Craig Finley [facility] ?MRN: [id number] ?Date of Birth: [birth year] ?Referring Provider (PT): Vanetta Mulders ? ? ?Encounter Date: 06/08/2021 ? ? PT End of Session - 06/08/21 0912   ? ? Visit Number 9   ? Number of Visits 17   ? Date for PT Re-Evaluation 06/22/21   ? Authorization Type [employer] Generic   ? Progress Note Due on Visit 10   ? PT Start Time [phone]   ? PT Stop Time [id number]   ? PT Time Calculation (min) 43 min   ? Activity Tolerance Patient tolerated treatment well   ? Behavior During Therapy [facility] for tasks assessed/performed   ? ?  ?  ? ?  ? ? ?Past Medical History:  ?Diagnosis Date  ? Allergy   ? Blood in stool   ? Chest pain   ? a. 11/2019 Cor CTA: Ca2 = 0. Nl cors.  ? CKD (chronic kidney disease) stage 2, GFR 60-89 ml/min   ? GERD (gastroesophageal reflux disease)   ? Heart murmur   ? as a child:  ? Hypertension   ? Pulmonary nodules   ? a. 11/2019 Cor CTA: Small L lung nodules - largest 75mm - incidental finding; b. 12/2020 Chest CT: Sm bilat pulm nodules - unchanged.  ? ? ?Past Surgical History:  ?Procedure Laterality Date  ? ANAL FISSURE REPAIR  05/1998  ? some type of anal fissure procedure  ? ARTHOSCOPIC ROTAOR CUFF REPAIR Right 04/23/2021  ? Procedure: Right ARTHROSCOPIC ROTATOR CUFF REPAIR;  Surgeon: Vanetta Mulders, MD;  Location: [place];  Service: Orthopedics;  Laterality: Right;  ? COLONOSCOPY WITH PROPOFOL N/A 01/31/2020  ? Procedure: COLONOSCOPY WITH PROPOFOL;  Surgeon: Lin Landsman, MD;  [facility];  Service: Gastroenterology;  Laterality: N/A;  ? ESOPHAGOGASTRODUODENOSCOPY (EGD) WITH PROPOFOL N/A 01/31/2020  ? Procedure: ESOPHAGOGASTRODUODENOSCOPY (EGD) WITH PROPOFOL;  Surgeon: Lin Landsman, MD;  Location: [facility];  Service: Gastroenterology;  Laterality:  N/A;  ? ? ?There were no vitals filed for this visit. ? ? Subjective Assessment - 06/08/21 0836   ? ? Subjective Pt reports pain at a 3/10 NPS. No other concerns at this time.   ? Pertinent History 61 y.o. male patient presents with acute R shoulder pain S/P right arthroscopic supraspinatus repair on 04/23/21. pnt describes pain in R shoulder as a constant pressure and staying at a 1/10 NPS since surgery. Pnt has 3 steps entering into home with bilateral railing- pnt reports no trouble with this. Pnt reports numbness in R thumb and aggrevating factors include sleeping positioning and taking arm out of sling. pnt has had prior physical therapy for his shoulder that he reported helped him. pnt currently sleeping in a recliner and wants to have better sleep- the general "stiffness" is keeping him awake at night. Easing factors include NSAIDs and ice which he applies 2x a day for approx 30 min. pnt goals include going back to playing softball (prior playing regularly on local team) and wanting to sleep comfortably. pnt denys weight change, B/B changes, n/v, fever, night pain, or falls within the last 6 months.   ? Limitations Lifting;House hold activities   ? How long can you sit comfortably? unlimited   ? How long can you stand comfortably? unlimited   ? How long can you walk comfortably? unlimited   ?  Diagnostic tests xray, unremarkable aside from age-typical arthrosis   ? Patient Stated Goals pnt would like to sleep comfortably and return to softball   ? Currently in Pain? Yes   ? Pain Score 3    ? Pain Orientation Right   ? Pain Descriptors / Indicators Dull   ? Pain Type Acute pain   ? Pain Onset 1 to 4 weeks ago   ? ?  ?  ? ?  ? ? ? ?There.ex:  ? ?PROM flexion, abduction, ER for 12 minutes with pt in supine.  ? ?Supine AAROM flexion with dowel: x20, 5 sec holds  ? ?Supine AROM flexion in scapular plane per protocol: x10 ? ?Standing R shoulder isometrics: ER/IR, x10, 8 sec holds. Good understanding of new  exercise. ? ?Seated R shoulder abduction AAROM with dowel: x15 ? ? ? PT Education - 06/08/21 0911   ? ? Education Details form/technique with exercise and protocol   ? Person(s) Educated Patient   ? Methods Explanation;Demonstration;Verbal cues;Tactile cues   ? Comprehension Verbalized understanding;Returned demonstration   ? ?  ?  ? ?  ? ? ? PT Short Term Goals - 04/26/21 1227   ? ?  ? PT SHORT TERM GOAL #1  ? Title Pt will demonstrate independence with HEP to improve R shoulder function for increased ability to participate with ADLs.   ? Baseline 04/26/21 HEP given   ? Time 4   ? Period Weeks   ? Status New   ? Target Date 05/24/21   ? ?  ?  ? ?  ? ? ? ? PT Long Term Goals - 04/26/21 1228   ? ?  ? PT LONG TERM GOAL #1  ? Title Pnt will increase R shoulder abduction and flexion ROM to >170 degrees in order to complete functional ADLs   ? Baseline 04/26/21 unable to access   ? Time 8   ? Period Weeks   ? Status New   ? Target Date 06/21/21   ?  ? PT LONG TERM GOAL #2  ? Title Pnt will be able to comeplete Apley scratch test on R shoulder and touch inferior border of left scapula to show clinically significant increase in IR to shower independently.   ? Baseline 04/26/21 unable to access   ? Time 8   ? Period Weeks   ? Status New   ? Target Date 06/21/21   ?  ? PT LONG TERM GOAL #3  ? Title pnt will increase R grip dynomometer score by to 85# to demonstrate PLOFstrength evidenced by uninvolved side needed for heavy household tasks   ? Baseline 04/26/21 R: 25lbs   ? Time 8   ? Period Weeks   ? Status New   ? Target Date 06/21/21   ?  ? PT LONG TERM GOAL #4  ? Title Pt will demonstrate gross R shoulder and elbow strength of 5/5 in order to demonstrate PLOF strength for heavy household ADLs and return to sport   ? Baseline 04/26/21 no test performed   ? Time 8   ? Period Weeks   ? Status New   ? ?  ?  ? ?  ? ? ? ? ? ? ? ? Plan - 06/08/21 0903   ? ? Clinical Impression Statement Pt at 6 week mark of protocol. Use of session with  PROM near end ranges of motion with flexion approaching full range. Abduction remains most limited at about 95-100 degrees passively.  Continuing with upright AAROM motions. Pt tolerating scapular plane AROM in supine without aggravation with good understanding of gentle rotator cuff ER/IR isometrics. Pt will be at 10th visit next session so will require progress note to assess progress towards goals.   ? Personal Factors and Comorbidities Past/Current Experience;Age   ? Examination-Activity Limitations Carry;Lift;Bed Mobility;Sleep;Dressing;Reach Overhead;Hygiene/Grooming   ? Examination-Participation Restrictions Driving;Cleaning;Meal Prep;Yard Work;Community Activity;Laundry;Occupation   ? Stability/Clinical Decision Making Evolving/Moderate complexity   ? Rehab Potential Good   ? PT Frequency 2x / week   ? PT Duration 8 weeks   ? PT Treatment/Interventions ADLs/Self Care Home Management;Cryotherapy;Electrical Stimulation;Iontophoresis 4mg /ml Dexamethasone;Moist Heat;Traction;Ultrasound;Functional mobility training;Therapeutic activities;Neuromuscular re-education;Therapeutic exercise;Patient/family education;Manual techniques;Passive range of motion;Dry needling;Splinting;Scar mobilization;Spinal Manipulations;Joint Manipulations   ? PT Next Visit Plan Progress note, weeks 6-8 protocol   ? Consulted and Agree with Plan of Care Patient   ? ?  ?  ? ?  ? ? ?Patient will benefit from skilled therapeutic intervention in order to improve the following deficits and impairments:  Decreased activity tolerance, Decreased endurance, Decreased range of motion, Decreased strength, Hypomobility, Increased fascial restricitons, Impaired UE functional use, Pain, Decreased coordination, Decreased mobility, Postural dysfunction ? ?Visit Diagnosis: ?Chronic right shoulder pain ? ? ? ? ?Problem List ?Patient Active Problem List  ? Diagnosis Date Noted  ? Complete tear of right rotator cuff 03/22/2021  ? Right shoulder pain  01/30/2021  ? Primary hypertension 01/30/2021  ? Non-seasonal allergic rhinitis 02/14/2020  ? Chest pain, non-cardiac 01/07/2020  ? Pulmonary nodules 01/07/2020  ? Essential hypertension 12/09/2019  ? Elevated serum creati

## 2021-06-12 ENCOUNTER — Ambulatory Visit: Payer: 59

## 2021-06-12 ENCOUNTER — Other Ambulatory Visit: Payer: Self-pay

## 2021-06-12 DIAGNOSIS — M25511 Pain in right shoulder: Secondary | ICD-10-CM | POA: Diagnosis not present

## 2021-06-12 DIAGNOSIS — G8929 Other chronic pain: Secondary | ICD-10-CM

## 2021-06-12 NOTE — Therapy (Signed)
?[facility] ?[address] ?[place], [place], [address] ?Phone: [phone]   Fax:  [phone] ? ?Physical Therapy Treatment/Reassessment ?Physical Therapy Progress Note ? ? ?Dates of reporting period  04/26/21   to   06/12/21 ? ? ?Patient Details  ?Name: Craig Finley ?MRN: [id number] ?Date of Birth: 1960/04/15 ?Referring Provider (PT): Vanetta Mulders ? ? ?Encounter Date: 06/12/2021 ? ? PT End of Session - 06/12/21 1720   ? ? Visit Number 10   ? Number of Visits 17   ? Date for PT Re-Evaluation 07/10/21   ? Authorization Type [employer] Generic   ? Authorization Time Period 04/26/21-06/22/21   ? Progress Note Due on Visit 20   ? PT Start Time [id number]   ? PT Stop Time [id number]   ? PT Time Calculation (min) 40 min   ? Activity Tolerance Patient tolerated treatment well;No increased pain   ? Behavior During Therapy [facility] for tasks assessed/performed   ? ?  ?  ? ?  ? ? ?Past Medical History:  ?Diagnosis Date  ? Allergy   ? Blood in stool   ? Chest pain   ? a. 11/2019 Cor CTA: Ca2 = 0. Nl cors.  ? CKD (chronic kidney disease) stage 2, GFR 60-89 ml/min   ? GERD (gastroesophageal reflux disease)   ? Heart murmur   ? as a child:  ? Hypertension   ? Pulmonary nodules   ? a. 11/2019 Cor CTA: Small L lung nodules - largest 35mm - incidental finding; b. 12/2020 Chest CT: Sm bilat pulm nodules - unchanged.  ? ? ?Past Surgical History:  ?Procedure Laterality Date  ? ANAL FISSURE REPAIR  05/1998  ? some type of anal fissure procedure  ? ARTHOSCOPIC ROTAOR CUFF REPAIR Right 04/23/2021  ? Procedure: Right ARTHROSCOPIC ROTATOR CUFF REPAIR;  Surgeon: Vanetta Mulders, MD;  Location: [facility];  Service: Orthopedics;  Laterality: Right;  ? COLONOSCOPY WITH PROPOFOL N/A 01/31/2020  ? Procedure: COLONOSCOPY WITH PROPOFOL;  Surgeon: Lin Landsman, MD;  Location: [facility] ENDOSCOPY;  Service: Gastroenterology;  Laterality: N/A;  ? ESOPHAGOGASTRODUODENOSCOPY (EGD) WITH PROPOFOL N/A 01/31/2020  ?  Procedure: ESOPHAGOGASTRODUODENOSCOPY (EGD) WITH PROPOFOL;  Surgeon: Lin Landsman, MD;  Location: [facility] ENDOSCOPY;  Service: Gastroenterology;  Laterality: N/A;  ? ? ?There were no vitals filed for this visit. ? ? Subjective Assessment - 06/12/21 1608   ? ? Subjective Pt doing well today, feels like he is steadily making progress. He still has pain when trying to sleep at night. His functional use of the RUE is still very limited. Pt eager to improve his arm function to return to work. For pain he conitnues to ice, minimal help from acetomenophen or NSAID therapy.   ? Limitations Lifting;House hold activities   ? How long can you sit comfortably? unlimited   ? How long can you stand comfortably? unlimited   ? How long can you walk comfortably? unlimited   ? Diagnostic tests xray, unremarkable aside from age-typical arthrosis   ? Patient Stated Goals pnt would like to sleep comfortably and return to softball   ? Currently in Pain? Yes   ? Pain Score 3    ? Pain Location --   rt proximla anterior shoulder  ? ?  ?  ? ?  ? ? ? ? ? [facility] PT Assessment - 06/12/21 0001   ? ?  ? Observation/Other Assessments  ? Focus on Therapeutic Outcomes (FOTO)  45  13 at eval  ?  ? ROM / Strength  ? AROM / PROM / Strength AROM;PROM   ?  ? AROM  ? AROM Assessment Site Shoulder   ? Right/Left Shoulder Right;Left   ? Right Shoulder Flexion 110 Degrees    ? Right Shoulder ABduction 94 Degrees    ? Right Shoulder Internal Rotation --    Struggles to get to midline (S2) level  ? Right Shoulder External Rotation --    C5  ?  ? PROM  ? PROM Assessment Site Shoulder   ? Right/Left Shoulder Right;Left   ? Right Shoulder Flexion 126 Degrees    ? Right Shoulder ABduction 85 Degrees    ? Right Shoulder Internal Rotation 30 Degrees    ? Right Shoulder External Rotation 60 Degrees    ? ?  ?  ? ?  ? ? ?INTERVENTION:  ?-RUE scaption distraction LAD 2x60 sec  ?-RUE pulleys ABDCT walkout 1x60sec stretch  ?-RUE scaption hip hinge pec stretch  2x45sec ? ? ? ? ? PT Short Term Goals - 06/12/21 1641   ? ?  ? PT SHORT TERM GOAL #1  ? Title Pt will demonstrate independence with HEP to improve R shoulder function for increased ability to participate with ADLs.   ? Baseline 04/26/21 HEP given   ? Time 4   ? Period Weeks   ? Status Achieved   ? Target Date 05/24/21   ? ?  ?  ? ?  ? ? ? ? PT Long Term Goals - 06/12/21 1642   ? ?  ? PT LONG TERM GOAL #1  ? Title Pt will increase R shoulder abduction and flexion ROM to >170 degrees in order to complete functional ADL   ? Baseline 04/26/21 unable to assess; 06/12/21: 85-95 degrees   ? Time 8   ? Period Weeks   ? Status On-going   ? Target Date 06/21/21   ?  ? PT LONG TERM GOAL #2  ? Title Pt will be able to complete Apley scratch test on Rt shoulder and touch inferior border of left scapula to show clinically significant increase in IR to shower independently.   ? Baseline 04/26/21 unable to access; 3/21: ROM stil very limited, max effort to bring Rt thumb to central spine.   ? Time 8   ? Period Weeks   ? Status On-going   ? Target Date 06/21/21   ?  ? PT LONG TERM GOAL #3  ? Title Pt will increase R grip dynomometer score by to 85# to demonstrate PLOF strength evidenced by uninvolved side needed for heavy household tasks.   ? Baseline 04/26/21 R: 25lbs   ? Time 8   ? Period Weeks   ? Status On-going   ? Target Date 06/21/21   ?  ? PT LONG TERM GOAL #4  ? Title Pt will demonstrate gross R shoulder and elbow strength of 5/5 in order to demonstrate PLOF strength for heavy household ADLs and return to sport   ? Baseline 04/26/21 no test performed   ? Time 8   ? Period Weeks   ? Status On-going   ? Target Date 03/20/21   ? ?  ?  ? ?  ? ? ? ? ? ? ? ? Plan - 06/12/21 1723   ? ? Clinical Impression Statement Reassessment today on 10th visit, noted substantial improvement in FOTO score without surprise given early postoperative restricitons. ROM continues to improve overall, however remains  fairly limited with capsular restirctions at  end range. Pt still lacks >35% ROM. Pain is otherwise well controlled with excepetion to labored acitve effort beyond availabel range. Extensive soft tissue assessment today to r/o contributing spasm, pec major/minor and deltoid largely uncontributing, questionable tightness in subscapularis which responds well to combination of sustained release and stretch. Updated HEP to include 2 introductory low-load-long-duration stretching. Will continue to work on mor efocal ROM work in next session and commence moderate strength training within available range.   ? Personal Factors and Comorbidities Past/Current Experience;Age   ? Examination-Activity Limitations Carry;Lift;Bed Mobility;Sleep;Dressing;Reach Overhead;Hygiene/Grooming   ? Examination-Participation Restrictions Driving;Cleaning;Meal Prep;Yard Work;Community Activity;Laundry;Occupation   ? Stability/Clinical Decision Making Evolving/Moderate complexity   ? Clinical Decision Making Moderate   ? Rehab Potential Good   ? PT Frequency 2x / week   ? PT Duration 8 weeks   ? PT Treatment/Interventions ADLs/Self Care Home Management;Cryotherapy;Electrical Stimulation;Iontophoresis 4mg /ml Dexamethasone;Moist Heat;Traction;Ultrasound;Functional mobility training;Therapeutic activities;Neuromuscular re-education;Therapeutic exercise;Patient/family education;Manual techniques;Passive range of motion;Dry needling;Splinting;Scar mobilization;Spinal Manipulations;Joint Manipulations   ? PT Next Visit Plan Update LLLD stretches for shoulder and FU on recently given   ? PT Home Exercise Plan Elbow flexion/extension, gripping, pendulums; pulley walkouts for shoulder ABD/distract stretch, wide countertop hip hinge pec/scaption stretch   ? Consulted and Agree with Plan of Care Patient   ? ?  ?  ? ?  ? ? ?Patient will benefit from skilled therapeutic intervention in order to improve the following deficits and impairments:  Decreased activity tolerance, Decreased endurance,  Decreased range of motion, Decreased strength, Hypomobility, Increased fascial restricitons, Impaired UE functional use, Pain, Decreased coordination, Decreased mobility, Postural dysfunction ? ?Visit Diagnosis: ?Ch

## 2021-06-14 ENCOUNTER — Ambulatory Visit: Payer: 59

## 2021-06-14 ENCOUNTER — Other Ambulatory Visit: Payer: Self-pay

## 2021-06-14 DIAGNOSIS — G8929 Other chronic pain: Secondary | ICD-10-CM

## 2021-06-14 DIAGNOSIS — M25511 Pain in right shoulder: Secondary | ICD-10-CM | POA: Diagnosis not present

## 2021-06-14 NOTE — Therapy (Signed)
[facility] ?[address] ?[place], [place], [address] ?Phone: [phone]   Fax:  [phone] ? ?Physical Therapy Treatment ? ?Patient Details  ?Name: Craig Finley ?MRN: [id number] ?Date of Birth: 1960/06/05 ?Referring Provider (PT): Vanetta Mulders ? ? ?Encounter Date: 06/14/2021 ? ? PT End of Session - 06/14/21 1612   ? ? Visit Number 11   ? Number of Visits 17   ? Date for PT Re-Evaluation 07/10/21   ? Authorization Type [employer] Generic   ? Authorization Time Period 04/26/21-06/22/21   ? Progress Note Due on Visit 20   ? PT Start Time 1602   ? PT Stop Time 1642   ? PT Time Calculation (min) 40 min   ? Activity Tolerance Patient tolerated treatment well;No increased pain   ? Behavior During Therapy [facility] for tasks assessed/performed   ? ?  ?  ? ?  ? ? ?Past Medical History:  ?Diagnosis Date  ? Allergy   ? Blood in stool   ? Chest pain   ? a. 11/2019 Cor CTA: Ca2 = 0. Nl cors.  ? CKD (chronic kidney disease) stage 2, GFR 60-89 ml/min   ? GERD (gastroesophageal reflux disease)   ? Heart murmur   ? as a child:  ? Hypertension   ? Pulmonary nodules   ? a. 11/2019 Cor CTA: Small L lung nodules - largest 44mm - incidental finding; b. 12/2020 Chest CT: Sm bilat pulm nodules - unchanged.  ? ? ?Past Surgical History:  ?Procedure Laterality Date  ? ANAL FISSURE REPAIR  05/1998  ? some type of anal fissure procedure  ? ARTHOSCOPIC ROTAOR CUFF REPAIR Right 04/23/2021  ? Procedure: Right ARTHROSCOPIC ROTATOR CUFF REPAIR;  Surgeon: Vanetta Mulders, MD;  Location: [facility];  Service: Orthopedics;  Laterality: Right;  ? COLONOSCOPY WITH PROPOFOL N/A 01/31/2020  ? Procedure: COLONOSCOPY WITH PROPOFOL;  Surgeon: Lin Landsman, MD;  Location: [facility] ENDOSCOPY;  Service: Gastroenterology;  Laterality: N/A;  ? ESOPHAGOGASTRODUODENOSCOPY (EGD) WITH PROPOFOL N/A 01/31/2020  ? Procedure: ESOPHAGOGASTRODUODENOSCOPY (EGD) WITH PROPOFOL;  Surgeon: Lin Landsman, MD;   Location: [facility] ENDOSCOPY;  Service: Gastroenterology;  Laterality: N/A;  ? ? ?There were no vitals filed for this visit. ? ? Subjective Assessment - 06/14/21 1606   ? ? Subjective Pt doing well today, slept well last night. Pt reports no adverse pain after last session and extension myofascial release. Pt reports success trying out the new AA/ROM stretch for home. He conitnues to work [employer] as well. Pt also tried some walkouts with pullies to achieve improved abduction ROM stretch.   ? Pertinent History Craig Finley is a 54yoM who is referred to OPPT for rehabilitzation   ? Currently in Pain? Yes   ? Pain Score 2    Rt shoulder nagging ache  ? ?  ?  ? ?  ? ? ? ?INTERVENTION THIS DATE: ?-big blue rollouts wide 15x3secH (AA/ROM flexion bilat)  ?-big blue rollouts Right abduction 15x3secH  ?-seated Rt shoulder ER table slides 15x3secH  ? ?-LAD at 30* scpation 2x60sec ?-lateral distaction Finley axial towel 2x30sec ?-posterior glide in 60* ABD grade 3 x30sec (2x)  ?-Rt shoulder flexion P/ROM stretch 3x60sec Finley manual scapular anchor (palpable release of muscle and improved range)  ?-Rt shoulder Abduction + axial distraction stretch 1x3 minutes (neutral rotation)  ?-Rt shoulder ER stretch 2x32minutes ? ? ? ? ? ? ? PT Education - 06/14/21 1614   ? ? Education Details  reviewed updated HEP stretches for home   ? Person(s) Educated Patient   ? Methods Explanation;Demonstration   ? Comprehension Verbalized understanding   ? ?  ?  ? ?  ? ? ? PT Short Term Goals - 06/12/21 1641   ? ?  ? PT SHORT TERM GOAL #1  ? Title Pt will demonstrate independence with HEP to improve R shoulder function for increased ability to participate with ADLs.   ? Baseline 04/26/21 HEP given   ? Time 4   ? Period Weeks   ? Status Achieved   ? Target Date 05/24/21   ? ?  ?  ? ?  ? ? ? ? PT Long Term Goals - 06/12/21 1642   ? ?  ? PT LONG TERM GOAL #1  ? Title Pt will increase R shoulder abduction and flexion ROM to >170 degrees in order to complete functional  ADL   ? Baseline 04/26/21 unable to assess; 06/12/21: 85-95 degrees   ? Time 8   ? Period Weeks   ? Status On-going   ? Target Date 06/21/21   ?  ? PT LONG TERM GOAL #2  ? Title Pt will be able to complete Apley scratch test on Rt shoulder and touch inferior border of left scapula to show clinically significant increase in IR to shower independently.   ? Baseline 04/26/21 unable to access; 3/21: ROM stil very limited, max effort to bring Rt thumb to central spine.   ? Time 8   ? Period Weeks   ? Status On-going   ? Target Date 06/21/21   ?  ? PT LONG TERM GOAL #3  ? Title Pt will increase R grip dynomometer score by to 85# to demonstrate PLOF strength evidenced by uninvolved side needed for heavy household tasks.   ? Baseline 04/26/21 R: 25lbs   ? Time 8   ? Period Weeks   ? Status On-going   ? Target Date 06/21/21   ?  ? PT LONG TERM GOAL #4  ? Title Pt will demonstrate gross R shoulder and elbow strength of 5/5 in order to demonstrate PLOF strength for heavy household ADLs and return to sport   ? Baseline 04/26/21 no test performed   ? Time 8   ? Period Weeks   ? Status On-going   ? Target Date 03/20/21   ? ?  ?  ? ?  ? ? ? ? ? ? ? ? Plan - 06/14/21 1614   ? ? Clinical Impression Statement Continued with AA/ROM in Flexion, ABDCT, ER, gentle joint mobilization for paian mangement and capsular mobility. Pt able to compete session without any adverse effect or increased pain.   ? Personal Factors and Comorbidities Past/Current Experience;Age   ? Examination-Activity Limitations Carry;Lift;Bed Mobility;Sleep;Dressing;Reach Overhead;Hygiene/Grooming   ? Examination-Participation Restrictions Driving;Cleaning;Meal Prep;Yard Work;Community Activity;Laundry;Occupation   ? Stability/Clinical Decision Making Evolving/Moderate complexity   ? Clinical Decision Making Moderate   ? Rehab Potential Good   ? PT Frequency 2x / week   ? PT Duration 8 weeks   ? PT Treatment/Interventions ADLs/Self Care Home Management;Cryotherapy;Electrical  Stimulation;Iontophoresis 4mg /ml Dexamethasone;Moist Heat;Traction;Ultrasound;Functional mobility training;Therapeutic activities;Neuromuscular re-education;Therapeutic exercise;Patient/family education;Manual techniques;Passive range of motion;Dry needling;Splinting;Scar mobilization;Spinal Manipulations;Joint Manipulations   ? PT Next Visit Plan Update LLLD stretches for shoulder and FU on recently given   ? PT Home Exercise Plan Elbow flexion/extension, gripping, pendulums; pulley walkouts for shoulder ABD/distract stretch, wide countertop hip hinge pec/scaption stretch   ? Consulted and Agree with Plan  of Care Patient   ? ?  ?  ? ?  ? ? ?Patient will benefit from skilled therapeutic intervention in order to improve the following deficits and impairments:  Decreased activity tolerance, Decreased endurance, Decreased range of motion, Decreased strength, Hypomobility, Increased fascial restricitons, Impaired UE functional use, Pain, Decreased coordination, Decreased mobility, Postural dysfunction ? ?Visit Diagnosis: ?Chronic right shoulder pain ? ? ? ? ?Problem List ?Patient Active Problem List  ? Diagnosis Date Noted  ? Complete tear of right rotator cuff 03/22/2021  ? Right shoulder pain 01/30/2021  ? Primary hypertension 01/30/2021  ? Non-seasonal allergic rhinitis 02/14/2020  ? Chest pain, non-cardiac 01/07/2020  ? Pulmonary nodules 01/07/2020  ? Essential hypertension 12/09/2019  ? Elevated serum creatinine 11/27/2019  ? Elevated BP without diagnosis of hypertension 11/27/2019  ? Breast pain, left 11/27/2019  ? Rectal bleeding 11/04/2019  ? Gastroesophageal reflux disease 11/04/2019  ? Chest pain 11/04/2019  ? SOB (shortness of breath) 11/04/2019  ? BMI 30.0-30.9,adult 11/04/2019  ? Encounter for HCV screening test for low risk patient 11/04/2019  ? Screening for HIV (human immunodeficiency virus) 11/04/2019  ? Encounter for screening and preventative care 11/04/2019  ? Tobacco chew use 11/04/2019  ? ?4:20 PM,  06/14/21 ?Etta Grandchild, PT, DPT ?Physical Therapist - [place] ?[phone] (Office) ? ? ?Craig Finley, PT ?06/14/2021, 4:18 PM ? ?[place] [facility] [facility]

## 2021-06-18 ENCOUNTER — Other Ambulatory Visit: Payer: Self-pay

## 2021-06-18 ENCOUNTER — Ambulatory Visit (INDEPENDENT_AMBULATORY_CARE_PROVIDER_SITE_OTHER): Payer: 59 | Admitting: Orthopaedic Surgery

## 2021-06-18 DIAGNOSIS — S46011A Strain of muscle(s) and tendon(s) of the rotator cuff of right shoulder, initial encounter: Secondary | ICD-10-CM

## 2021-06-18 NOTE — Progress Notes (Signed)
? ?                            ? ? ?Post Operative Evaluation ?  ? ?Procedure/Date of Surgery: Right shoulder rotator cuff repair done on April 23, 2021 ? ?Presents today for follow-up of his right shoulder.  He has been going to physical therapy twice weekly and working on his range of motion.  Presents here today for range of motion check. ? ?PMH/PSH/Family History/Social History/Meds/Allergies:   ? ?Past Medical History:  ?Diagnosis Date  ? Allergy   ? Blood in stool   ? Chest pain   ? a. 11/2019 Cor CTA: Ca2 = 0. Nl cors.  ? CKD (chronic kidney disease) stage 2, GFR 60-89 ml/min   ? GERD (gastroesophageal reflux disease)   ? Heart murmur   ? as a child:  ? Hypertension   ? Pulmonary nodules   ? a. 11/2019 Cor CTA: Small L lung nodules - largest 4mm - incidental finding; b. 12/2020 Chest CT: Sm bilat pulm nodules - unchanged.  ? ?Past Surgical History:  ?Procedure Laterality Date  ? ANAL FISSURE REPAIR  05/1998  ? some type of anal fissure procedure  ? ARTHOSCOPIC ROTAOR CUFF REPAIR Right 04/23/2021  ? Procedure: Right ARTHROSCOPIC ROTATOR CUFF REPAIR;  Surgeon: Huel CoteBokshan, Darrick Greenlaw, MD;  [facility];  Service: Orthopedics;  Laterality: Right;  ? COLONOSCOPY WITH PROPOFOL N/A 01/31/2020  ? Procedure: COLONOSCOPY WITH PROPOFOL;  Surgeon: Toney ReilVanga, Rohini Reddy, MD;  [facility];  Service: Gastroenterology;  Laterality: N/A;  ? ESOPHAGOGASTRODUODENOSCOPY (EGD) WITH PROPOFOL N/A 01/31/2020  ? Procedure: ESOPHAGOGASTRODUODENOSCOPY (EGD) WITH PROPOFOL;  Surgeon: Toney ReilVanga, Rohini Reddy, MD;  [facility];  Service: Gastroenterology;  Laterality: N/A;  ? ?Social History  ? ?Socioeconomic History  ? Marital status: Married  ?  Spouse name: Not on file  ? Number of children: Not on file  ? Years of education: Not on file  ? Highest education level: Some college, no degree  ?Occupational History  ? Occupation: trucker  ?Tobacco Use  ? Smoking status: Never  ? Smokeless tobacco: Former  ?  Types: Snuff  ?   Quit date: 04/02/2021  ? Tobacco comments:  ?  dipping x 25-30 years  ?Vaping Use  ? Vaping Use: Never used  ?Substance and Sexual Activity  ? Alcohol use: Yes  ?  Comment: drinks 4-7 beers some days and none others. He drinks 24 12 oz can  beers per   ? Drug use: Never  ? Sexual activity: Yes  ?Other Topics Concern  ? Not on file  ?Social History Narrative  ? 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 ?Social Determinants of Health  ? ?Financial Resource Strain: Not on file  ?Food Insecurity: Not on file  ?Transportation Needs: Not on file  ?Physical Activity: Not on file  ?Stress: Not on file  ?Social Connections: Not on file  ? ?Family History  ?Problem Relation Age of Onset  ? Arthritis Mother   ? Asthma Mother   ? Hypertension Mother   ? Hearing loss Father   ? Alcohol abuse Sister   ? Cancer Sister   ? Depression Sister   ? Drug abuse Sister   ? Early death Sister   ? Heart attack Sister   ? Hypertension Sister   ? Miscarriages / Stillbirths Sister   ? Asthma Sister   ? Depression Sister   ?  Heart disease Maternal Uncle   ? Heart disease Maternal Uncle   ? Alcohol abuse Maternal Grandmother   ? Cancer Maternal Grandmother   ? Cancer Maternal Grandfather   ? Asthma Daughter   ? Asthma Son   ? ?No Known Allergies ?Current Outpatient Medications  ?Medication Sig Dispense Refill  ? acetaminophen (TYLENOL) 500 MG tablet Take 1,000 mg by mouth every 8 (eight) hours as needed for moderate pain.    ? amLODipine (NORVASC) 5 MG tablet Take 1 tablet (5 mg total) by mouth daily. 90 tablet 3  ? Fexofenadine-Pseudoephedrine (ALLEGRA-D PO) Take 1 tablet by mouth daily as needed (allergies).    ? omeprazole (PRILOSEC) 20 MG capsule Take 1 capsule (20 mg total) by mouth daily. (Patient taking differently: Take 20 mg by mouth every other day.) 30 capsule 3  ? triamcinolone (NASACORT) 55 MCG/ACT AERO nasal inhaler Place 2 sprays into the nose daily. (Patient taking differently: Place 2 sprays into the nose daily as  needed (allergies).) 1 each 4  ? ?No current facility-administered medications for this visit.  ? ?No results found. ? ?Review of Systems:   ?A ROS was performed including pertinent positives and negatives as documented in the HPI. ? ? ?Musculoskeletal Exam:   ? ?There were no vitals taken for this visit. ? ?Incisions are clean dry and intact.  No erythema or drainage.  Passive forward elevation is to 130 degrees.  External rotation at the side is to 50 sensation intact all distributions including axillary distribution 2+ radial pulse ? ?Imaging:   ? ?None ? ?I personally reviewed and interpreted the radiographs. ? ? ?Assessment:   ?61 year old male 8 weeks status post right shoulder rotator cuff repair. ?Presents today for follow-up.  Overall his motion is much improved.  I would like him to continue to work on passive range of motion as well as active range of motion and continue to progress per protocol. ? ?Plan :   ? ?-Return to clinic in 4 weeks, work note provided until that time ? ? ? ? ?I personally saw and evaluated the patient, and participated in the management and treatment plan. ? ?Vanetta Mulders, MD ?Attending Physician, Orthopedic Surgery ? ?This document was dictated using Systems analyst. A reasonable attempt at proof reading has been made to minimize errors. ?

## 2021-06-19 ENCOUNTER — Ambulatory Visit: Payer: 59

## 2021-06-19 DIAGNOSIS — G8929 Other chronic pain: Secondary | ICD-10-CM

## 2021-06-19 DIAGNOSIS — M25511 Pain in right shoulder: Secondary | ICD-10-CM | POA: Diagnosis not present

## 2021-06-19 NOTE — Therapy (Signed)
?[facility] ?[address] ?[place], [place], [address] ?Phone: [phone]   Fax:  [phone] ? ?Physical Therapy Treatment ? ?Patient Details  ?Name: Craig Finley [facility] ?MRN: [id number] ?Date of Birth: [birth year] ?Referring Provider (PT): Vanetta Mulders ? ? ?Encounter Date: 06/19/2021 ? ? PT End of Session - 06/19/21 1309   ? ? Visit Number 12   ? Number of Visits 17   ? Date for PT Re-Evaluation 07/10/21   ? Authorization Type [employer] Generic   ? Authorization Time Period 04/26/21-06/22/21   ? Progress Note Due on Visit 20   ? PT Start Time 1300   ? PT Stop Time 1340   ? PT Time Calculation (min) 40 min   ? Activity Tolerance Patient tolerated treatment well;No increased pain   ? Behavior During Therapy [facility] for tasks assessed/performed   ? ?  ?  ? ?  ? ? ?Past Medical History:  ?Diagnosis Date  ? Allergy   ? Blood in stool   ? Chest pain   ? a. 11/2019 Cor CTA: Ca2 = 0. Nl cors.  ? CKD (chronic kidney disease) stage 2, GFR 60-89 ml/min   ? GERD (gastroesophageal reflux disease)   ? Heart murmur   ? as a child:  ? Hypertension   ? Pulmonary nodules   ? a. 11/2019 Cor CTA: Small L lung nodules - largest 90mm - incidental finding; b. 12/2020 Chest CT: Sm bilat pulm nodules - unchanged.  ? ? ?Past Surgical History:  ?Procedure Laterality Date  ? ANAL FISSURE REPAIR  05/1998  ? some type of anal fissure procedure  ? ARTHOSCOPIC ROTAOR CUFF REPAIR Right 04/23/2021  ? Procedure: Right ARTHROSCOPIC ROTATOR CUFF REPAIR;  Surgeon: Vanetta Mulders, MD;  Location: [place];  Service: Orthopedics;  Laterality: Right;  ? COLONOSCOPY WITH PROPOFOL N/A 01/31/2020  ? Procedure: COLONOSCOPY WITH PROPOFOL;  Surgeon: Lin Landsman, MD;  Location: [facility] ENDOSCOPY;  Service: Gastroenterology;  Laterality: N/A;  ? ESOPHAGOGASTRODUODENOSCOPY (EGD) WITH PROPOFOL N/A 01/31/2020  ? Procedure: ESOPHAGOGASTRODUODENOSCOPY (EGD) WITH PROPOFOL;  Surgeon: Lin Landsman, MD;   Location: [facility] ENDOSCOPY;  Service: Gastroenterology;  Laterality: N/A;  ? ? ?There were no vitals filed for this visit. ? ? Subjective Assessment - 06/19/21 1307   ? ? Subjective Pt doing well in general still having fluctuations in sorenss/pressure, that he manages well with stretches at home. He saw the orthopedist last week and had a good visit.   ? Pertinent History Craig Finley is a 43yoM who is referred to OPPT for rehabilitzation   ? Currently in Pain? Yes   ? Pain Score 1    2-3/10 Right  ? ?  ?  ? ?  ? ? ?INTERVENTION THIS DATE: ?-big blue rollouts wide 15x3secH (AA/ROM flexion bilat)  ?-big blue rollouts Right abduction 15x3secH  ?-seated Rt shoulder ER table slides 5x30secH  ?-TRX Strap RUE ABDCT walkout and lean distraction stretch 2x45sec ?-seated  RUE ADD isometric 10x3secH c ball  ? ?Supine manual therapy for P/ROM ?-shoulder flexion with scapular anchor ?-ABDCT with lateral LAD ?-isometric stabilization at 90 degrees with protraction 5x10sec  ?-rt anterior deltoid release  ? ?-supine isometric BUE IR on [place] physioball 15x3secH  ?-lateral glide GHJ in 90 degrees flexion with belt  ? ?Lt sidelying Rt GHJ ER in supination  ?Lt sidelying Rt shoulder ABDCT AA/ROM with minA (avoiding 90 due to P/ROM restriction, as not to teach abdnormal motor patterns)  ? ? ? ? ? ?  PT Education - 06/19/21 1311   ? ? Education Details reviewed modified stretches   ? Person(s) Educated Patient   ? Methods Explanation;Demonstration   ? Comprehension Verbalized understanding;Returned demonstration   ? ?  ?  ? ?  ? ? ? PT Short Term Goals - 06/12/21 1641   ? ?  ? PT SHORT TERM GOAL #1  ? Title Pt will demonstrate independence with HEP to improve R shoulder function for increased ability to participate with ADLs.   ? Baseline 04/26/21 HEP given   ? Time 4   ? Period Weeks   ? Status Achieved   ? Target Date 05/24/21   ? ?  ?  ? ?  ? ? ? ? PT Long Term Goals - 06/12/21 1642   ? ?  ? PT LONG TERM GOAL #1  ? Title Pt will  increase R shoulder abduction and flexion ROM to >170 degrees in order to complete functional ADL   ? Baseline 04/26/21 unable to assess; 06/12/21: 85-95 degrees   ? Time 8   ? Period Weeks   ? Status On-going   ? Target Date 06/21/21   ?  ? PT LONG TERM GOAL #2  ? Title Pt will be able to complete Apley scratch test on Rt shoulder and touch inferior border of left scapula to show clinically significant increase in IR to shower independently.   ? Baseline 04/26/21 unable to access; 3/21: ROM stil very limited, max effort to bring Rt thumb to central spine.   ? Time 8   ? Period Weeks   ? Status On-going   ? Target Date 06/21/21   ?  ? PT LONG TERM GOAL #3  ? Title Pt will increase R grip dynomometer score by to 85# to demonstrate PLOF strength evidenced by uninvolved side needed for heavy household tasks.   ? Baseline 04/26/21 R: 25lbs   ? Time 8   ? Period Weeks   ? Status On-going   ? Target Date 06/21/21   ?  ? PT LONG TERM GOAL #4  ? Title Pt will demonstrate gross R shoulder and elbow strength of 5/5 in order to demonstrate PLOF strength for heavy household ADLs and return to sport   ? Baseline 04/26/21 no test performed   ? Time 8   ? Period Weeks   ? Status On-going   ? Target Date 03/20/21   ? ?  ?  ? ?  ? ? ? ? ? ? ? ? Plan - 06/19/21 1311   ? ? Clinical Impression Statement Targeted ROM work and gentle activation this date. Flexion improving quite well, ABDCT still limited due to teres minor restriction. Pt does entire session without pain exacerbation.  ? ?  ? Personal Factors and Comorbidities Past/Current Experience;Age   ? Examination-Activity Limitations Carry;Lift;Bed Mobility;Sleep;Dressing;Reach Overhead;Hygiene/Grooming   ? Examination-Participation Restrictions Driving;Cleaning;Meal Prep;Yard Work;Community Activity;Laundry;Occupation   ? Stability/Clinical Decision Making Evolving/Moderate complexity   ? Clinical Decision Making Moderate   ? Rehab Potential Good   ? PT Frequency 2x / week   ? PT Duration  8 weeks   ? PT Treatment/Interventions ADLs/Self Care Home Management;Cryotherapy;Electrical Stimulation;Iontophoresis 4mg /ml Dexamethasone;Moist Heat;Traction;Ultrasound;Functional mobility training;Therapeutic activities;Neuromuscular re-education;Therapeutic exercise;Patient/family education;Manual techniques;Passive range of motion;Dry needling;Splinting;Scar mobilization;Spinal Manipulations;Joint Manipulations   ? PT Next Visit Plan Update LLLD stretches for shoulder and FU on recently given   ? PT Home Exercise Plan Elbow flexion/extension, gripping, pendulums; pulley walkouts for shoulder ABD/distract stretch, wide countertop hip hinge  pec/scaption stretch   ? Consulted and Agree with Plan of Care Patient   ? ?  ?  ? ?  ? ? ?Patient will benefit from skilled therapeutic intervention in order to improve the following deficits and impairments:  Decreased activity tolerance, Decreased endurance, Decreased range of motion, Decreased strength, Hypomobility, Increased fascial restricitons, Impaired UE functional use, Pain, Decreased coordination, Decreased mobility, Postural dysfunction ? ?Visit Diagnosis: ?Chronic right shoulder pain ? ? ? ? ?Problem List ?Patient Active Problem List  ? Diagnosis Date Noted  ? Complete tear of right rotator cuff 03/22/2021  ? Right shoulder pain 01/30/2021  ? Primary hypertension 01/30/2021  ? Non-seasonal allergic rhinitis 02/14/2020  ? Chest pain, non-cardiac 01/07/2020  ? Pulmonary nodules 01/07/2020  ? Essential hypertension 12/09/2019  ? Elevated serum creatinine 11/27/2019  ? Elevated BP without diagnosis of hypertension 11/27/2019  ? Breast pain, left 11/27/2019  ? Rectal bleeding 11/04/2019  ? Gastroesophageal reflux disease 11/04/2019  ? Chest pain 11/04/2019  ? SOB (shortness of breath) 11/04/2019  ? BMI 30.0-30.9,adult 11/04/2019  ? Encounter for HCV screening test for low risk patient 11/04/2019  ? Screening for HIV (human immunodeficiency virus) 11/04/2019  ?  Encounter for screening and preventative care 11/04/2019  ? Tobacco chew use 11/04/2019  ? ?1:46 PM, 06/19/21 ?Etta Grandchild, PT, DPT ?Physical Therapist - [place] ?[phone] (Office) ? ? ?Jameon Deller C,

## 2021-06-21 ENCOUNTER — Encounter: Payer: Self-pay | Admitting: Internal Medicine

## 2021-06-21 ENCOUNTER — Ambulatory Visit (INDEPENDENT_AMBULATORY_CARE_PROVIDER_SITE_OTHER): Payer: 59 | Admitting: Internal Medicine

## 2021-06-21 VITALS — BP 131/83 | HR 65 | Temp 98.7°F | Ht 66.73 in | Wt 210.6 lb

## 2021-06-21 DIAGNOSIS — R17 Unspecified jaundice: Secondary | ICD-10-CM | POA: Insufficient documentation

## 2021-06-21 DIAGNOSIS — F1010 Alcohol abuse, uncomplicated: Secondary | ICD-10-CM | POA: Diagnosis not present

## 2021-06-21 NOTE — Progress Notes (Signed)
? ?BP 131/83   Pulse 65   Temp 98.7 ?F (37.1 ?C) (Oral)   Ht 5' 6.73" (1.695 m)   Wt 210 lb 9.6 oz (95.5 kg)   SpO2 99%   BMI 33.25 kg/m?   ? ?Subjective:  ? ? Patient ID: Craig Finley, male    DOB: 18-Nov-1960, 61 y.o.   MRN: [id number] ? ?No chief complaint on file. ? ? ?HPI: ?Craig Finley is a 61 y.o. male ? ?Elevated biulirubin ? ?Hypertension ?This is a chronic problem.  ? ?No chief complaint on file. ? ? ?Relevant past medical, surgical, family and social history reviewed and updated as indicated. Interim medical history since our last visit reviewed. ?Allergies and medications reviewed and updated. ? ?Review of Systems ? ?Per HPI unless specifically indicated above ? ?   ?Objective:  ?  ?BP 131/83   Pulse 65   Temp 98.7 ?F (37.1 ?C) (Oral)   Ht 5' 6.73" (1.695 m)   Wt 210 lb 9.6 oz (95.5 kg)   SpO2 99%   BMI 33.25 kg/m?   ?Wt Readings from Last 3 Encounters:  ?06/21/21 210 lb 9.6 oz (95.5 kg)  ?04/23/21 205 lb (93 kg)  ?04/10/21 206 lb (93.4 kg)  ?  ?Physical Exam ?Constitutional:   ?   Appearance: Normal appearance. He is obese.  ?Cardiovascular:  ?   Rate and Rhythm: Normal rate and regular rhythm.  ?   Heart sounds: No murmur heard. ?  No friction rub.  ?Musculoskeletal:     ?   General: No swelling, tenderness, deformity or signs of injury.  ?Skin: ?   Coloration: Skin is jaundiced. Skin is not pale.  ?   Findings: No bruising, erythema, lesion or rash.  ?   Comments: Eyes look a little yellow.  ?Neurological:  ?   Mental Status: He is alert.  ? ? ?Results for orders placed or performed during the hospital encounter of 04/23/21  ?CBC per protocol  ?Result Value Ref Range  ? WBC 5.5 4.0 - 10.5 K/uL  ? RBC 5.47 4.22 - 5.81 MIL/uL  ? Hemoglobin 16.5 13.0 - 17.0 g/dL  ? HCT 48.5 39.0 - 52.0 %  ? MCV 88.7 80.0 - 100.0 fL  ? MCH 30.2 26.0 - 34.0 pg  ? MCHC 34.0 30.0 - 36.0 g/dL  ? RDW 11.9 11.5 - 15.5 %  ? Platelets 218 150 - 400 K/uL  ? nRBC 0.0 0.0 - 0.2 %  ?Comprehensive  metabolic panel per protocol  ?Result Value Ref Range  ? Sodium 137 135 - 145 mmol/L  ? Potassium 4.6 3.5 - 5.1 mmol/L  ? Chloride 103 98 - 111 mmol/L  ? CO2 23 22 - 32 mmol/L  ? Glucose, Bld 111 (H) 70 - 99 mg/dL  ? BUN 11 6 - 20 mg/dL  ? Creatinine, Ser 1.21 0.61 - 1.24 mg/dL  ? Calcium 9.1 8.9 - 10.3 mg/dL  ? Total Protein 7.3 6.5 - 8.1 g/dL  ? Albumin 4.2 3.5 - 5.0 g/dL  ? AST 48 (H) 15 - 41 U/L  ? ALT 31 0 - 44 U/L  ? Alkaline Phosphatase 43 38 - 126 U/L  ? Total Bilirubin 3.1 (H) 0.3 - 1.2 mg/dL  ? GFR, Estimated >60 >60 mL/min  ? Anion gap 11 5 - 15  ? ?   ? ? ?Current Outpatient Medications:  ?  acetaminophen (TYLENOL) 500 MG tablet, Take 1,000 mg by mouth every 8 (eight) hours as  needed for moderate pain., Disp: , Rfl:  ?  amLODipine (NORVASC) 5 MG tablet, Take 1 tablet (5 mg total) by mouth daily., Disp: 90 tablet, Rfl: 3 ?  Fexofenadine-Pseudoephedrine (ALLEGRA-D PO), Take 1 tablet by mouth daily as needed (allergies)., Disp: , Rfl:  ?  omeprazole (PRILOSEC) 20 MG capsule, Take 1 capsule (20 mg total) by mouth daily. (Patient taking differently: Take 20 mg by mouth every other day.), Disp: 30 capsule, Rfl: 3 ?  triamcinolone (NASACORT) 55 MCG/ACT AERO nasal inhaler, Place 2 sprays into the nose daily. (Patient taking differently: Place 2 sprays into the nose daily as needed (allergies).), Disp: 1 each, Rfl: 4  ? ? ?Assessment & Plan:  ?Elevated bilirubin : ? Sec to ETOH abuse.  ?Check hepatitis panel. ?Check [place] RUQ  ? ?Etoh abuse :  ?Goes 1 week without but sometimes 3-6 beers  ? ?Right shoulder rotator cuff repair @ [facility] he has seen dr. Steward Drone ortho for such  ?Has had PT  ? ?Htn is on norvsac for such ?Continue current meds.  Medication compliance emphasised. pt advised to keep Bp logs. Pt verbalised understanding of the same. Pt to have a low salt diet . Exercise to reach a goal of at least 150 mins a week.  lifestyle modifications explained and pt understands importance of the above. ?Under good  control on current regimen. Continue current regimen. Continue to monitor. Call with any concerns. Refills given. Labs drawn today. ? ?Problem List Items Addressed This Visit   ? ?  ? Other  ? Elevated bilirubin - Primary  ? Relevant Orders  ? Comprehensive metabolic panel  ? Bilirubin, Direct  ? Hepatitis A antibody, IgM  ? Hepatitis B Surface AntiGEN  ? Hepatitis B Surface AntiBODY  ? Hepatitis C Antibody  ? US Abdomen Limited RUQ (LIVER/GB)  ? ETOH abuse  ? Relevant Orders  ? US Abdomen Limited RUQ (LIVER/GB)  ?  ? ?Orders Placed This Encounter  ?Procedures  ? US Abdomen Limited RUQ (LIVER/GB)  ? Comprehensive metabolic panel  ? Bilirubin, Direct  ? Hepatitis A antibody, IgM  ? Hepatitis B Surface AntiGEN  ? Hepatitis B Surface AntiBODY  ? Hepatitis C Antibody  ?  ? ?No orders of the defined types were placed in this encounter. ?  ? ?Follow up plan: ?Return in about 3 weeks (around 07/12/2021). ? ? ?

## 2021-06-22 ENCOUNTER — Ambulatory Visit: Payer: 59

## 2021-06-22 DIAGNOSIS — G8929 Other chronic pain: Secondary | ICD-10-CM

## 2021-06-22 DIAGNOSIS — M25511 Pain in right shoulder: Secondary | ICD-10-CM | POA: Diagnosis not present

## 2021-06-22 LAB — COMPREHENSIVE METABOLIC PANEL
ALT: 40 IU/L (ref 0–44)
AST: 29 IU/L (ref 0–40)
Albumin/Globulin Ratio: 1.8 (ref 1.2–2.2)
Albumin: 4.6 g/dL (ref 3.8–4.9)
Alkaline Phosphatase: 54 IU/L (ref 44–121)
BUN/Creatinine Ratio: 12 (ref 10–24)
BUN: 15 mg/dL (ref 8–27)
Bilirubin Total: 0.6 mg/dL (ref 0.0–1.2)
CO2: 24 mmol/L (ref 20–29)
Calcium: 9.6 mg/dL (ref 8.6–10.2)
Chloride: 102 mmol/L (ref 96–106)
Creatinine, Ser: 1.24 mg/dL (ref 0.76–1.27)
Globulin, Total: 2.6 g/dL (ref 1.5–4.5)
Glucose: 88 mg/dL (ref 70–99)
Potassium: 4 mmol/L (ref 3.5–5.2)
Sodium: 141 mmol/L (ref 134–144)
Total Protein: 7.2 g/dL (ref 6.0–8.5)
eGFR: 67 mL/min/{1.73_m2} (ref >59)

## 2021-06-22 LAB — HEPATITIS A ANTIBODY, IGM: Hep A IgM: NEGATIVE

## 2021-06-22 LAB — HEPATITIS B SURFACE ANTIBODY,QUALITATIVE: Hep B Surface Ab, Qual: NONREACTIVE

## 2021-06-22 LAB — HEPATITIS B SURFACE ANTIGEN: Hepatitis B Surface Ag: NEGATIVE

## 2021-06-22 LAB — HEPATITIS C ANTIBODY: Hep C Virus Ab: NONREACTIVE

## 2021-06-22 LAB — BILIRUBIN, DIRECT: Bilirubin, Direct: 0.17 mg/dL (ref 0.00–0.40)

## 2021-06-22 NOTE — Therapy (Signed)
[facility] [place] [facility] ?[address] ?[place], [place], [address] ?Phone: [phone]   Fax:  [phone] ? ?Physical Therapy Treatment ? ?Patient Details  ?Name: Craig Finley [facility] ?MRN: [id number] ?Date of Birth: [birth year] ?Referring Provider (PT): Vanetta Mulders ? ? ?Encounter Date: 06/22/2021 ? ? PT End of Session - 06/22/21 1013   ? ? Visit Number 13   ? Number of Visits 41   ? Date for PT Re-Evaluation 09/14/21   ? Authorization Type [employer] Generic   ? Authorization Time Period 04/26/21-06/22/21, 3/31/223-09/14/21   ? Progress Note Due on Visit 20   ? PT Start Time 1002   ? PT Stop Time [id number]   ? PT Time Calculation (min) 40 min   ? Activity Tolerance Patient tolerated treatment well;No increased pain   ? Behavior During Therapy [facility] for tasks assessed/performed   ? ?  ?  ? ?  ? ? ?Past Medical History:  ?Diagnosis Date  ? Allergy   ? Blood in stool   ? Chest pain   ? a. 11/2019 Cor CTA: Ca2 = 0. Nl cors.  ? CKD (chronic kidney disease) stage 2, GFR 60-89 ml/min   ? GERD (gastroesophageal reflux disease)   ? Heart murmur   ? as a child:  ? Hypertension   ? Pulmonary nodules   ? a. 11/2019 Cor CTA: Small L lung nodules - largest 86mm - incidental finding; b. 12/2020 Chest CT: Sm bilat pulm nodules - unchanged.  ? ? ?Past Surgical History:  ?Procedure Laterality Date  ? ANAL FISSURE REPAIR  05/1998  ? some type of anal fissure procedure  ? ARTHOSCOPIC ROTAOR CUFF REPAIR Right 04/23/2021  ? Procedure: Right ARTHROSCOPIC ROTATOR CUFF REPAIR;  Surgeon: Vanetta Mulders, MD;  Location: [place];  Service: Orthopedics;  Laterality: Right;  ? COLONOSCOPY WITH PROPOFOL N/A 01/31/2020  ? Procedure: COLONOSCOPY WITH PROPOFOL;  Surgeon: Lin Landsman, MD;  Location: [facility] ENDOSCOPY;  Service: Gastroenterology;  Laterality: N/A;  ? ESOPHAGOGASTRODUODENOSCOPY (EGD) WITH PROPOFOL N/A 01/31/2020  ? Procedure: ESOPHAGOGASTRODUODENOSCOPY (EGD) WITH PROPOFOL;  Surgeon: Lin Landsman, MD;  Location: [facility] ENDOSCOPY;  Service: Gastroenterology;  Laterality: N/A;  ? ? ?There were no vitals filed for this visit. ? ? Subjective Assessment - 06/22/21 1012   ? ? Subjective Pt feels good, still some discomfort at night. He conitnues to work on stretches at home.   ? Pertinent History Craig Finley is a 89yoM who is referred to OPPT for rehabilitzation s/p Rt shoulder arthroscopic supraspinatus repair.   ? Currently in Pain? No/denies   ? ?  ?  ? ?  ? ? ?INTERVENTION THIS DATE: ?-big blue rollouts wide 20x3secH ,3x45sec (AA/ROM flexion bilat)  ?-big blue rollouts Right abduction 15x3secH, 2x45sec ? ?-supine LLLD Rt shoulder ABDCT x5 minutes, heat under posterior shoulder (arm supported to promote more scapular plane)  ?-LAD in ABDCT 3x60sec ?-Supine Rue A/ROM in available range (125 degrees)  ?-Side lying RUE abdct A/ROM 1x12 dyskinesis after 80 degrees  ?-seated RUE ER A/ROM in 45* scaption, 45* flexion x15 ?-isotmetric Rt shoulder add pillow squeeze 1x12x3secH  ?-Rt elbow flexion 1x12  ? ? ? ? ? ? PT Education - 06/22/21 1027   ? ? Education Details reviewed home stretches   ? Person(s) Educated Patient   ? Methods Explanation   ? Comprehension Verbalized understanding;Returned demonstration   ? ?  ?  ? ?  ? ? ? PT Short Term Goals -  06/12/21 1641   ? ?  ? PT SHORT TERM GOAL #1  ? Title Pt will demonstrate independence with HEP to improve R shoulder function for increased ability to participate with ADLs.   ? Baseline 04/26/21 HEP given   ? Time 4   ? Period Weeks   ? Status Achieved   ? Target Date 05/24/21   ? ?  ?  ? ?  ? ? ? ? PT Long Term Goals - 06/12/21 1642   ? ?  ? PT LONG TERM GOAL #1  ? Title Pt will increase R shoulder abduction and flexion ROM to >170 degrees in order to complete functional ADL   ? Baseline 04/26/21 unable to assess; 06/12/21: 85-95 degrees   ? Time 8   ? Period Weeks   ? Status On-going   ? Target Date 06/21/21   ?  ? PT LONG TERM GOAL #2  ? Title Pt will be able to  complete Apley scratch test on Rt shoulder and touch inferior border of left scapula to show clinically significant increase in IR to shower independently.   ? Baseline 04/26/21 unable to access; 3/21: ROM stil very limited, max effort to bring Rt thumb to central spine.   ? Time 8   ? Period Weeks   ? Status On-going   ? Target Date 06/21/21   ?  ? PT LONG TERM GOAL #3  ? Title Pt will increase R grip dynomometer score by to 85# to demonstrate PLOF strength evidenced by uninvolved side needed for heavy household tasks.   ? Baseline 04/26/21 R: 25lbs   ? Time 8   ? Period Weeks   ? Status On-going   ? Target Date 06/21/21   ?  ? PT LONG TERM GOAL #4  ? Title Pt will demonstrate gross R shoulder and elbow strength of 5/5 in order to demonstrate PLOF strength for heavy household ADLs and return to sport   ? Baseline 04/26/21 no test performed   ? Time 8   ? Period Weeks   ? Status On-going   ? Target Date 03/20/21   ? ?  ?  ? ?  ? ? ? ? ? ? ? ? Plan - 06/22/21 1027   ? ? Clinical Impression Statement Continued with gentle ROM work this date, moving up in volume and more LLLD stretching coupled with joint/capsular stretch. Pain unprovoked largely. Scapular and humerus still rigidly associated in most gross movements of ABDCT most notable at 80 degrees ABDCT. Pt progressing well in general. Followed protocol provided by surgeon.   ? Personal Factors and Comorbidities Past/Current Experience;Age   ? Examination-Activity Limitations Carry;Lift;Bed Mobility;Sleep;Dressing;Reach Overhead;Hygiene/Grooming   ? Examination-Participation Restrictions Driving;Cleaning;Meal Prep;Yard Work;Community Activity;Laundry;Occupation   ? Stability/Clinical Decision Making Evolving/Moderate complexity   ? Clinical Decision Making Moderate   ? Rehab Potential Good   ? PT Frequency 2x / week   ? PT Duration 8 weeks   ? PT Treatment/Interventions ADLs/Self Care Home Management;Cryotherapy;Electrical Stimulation;Iontophoresis 4mg /ml  Dexamethasone;Moist Heat;Traction;Ultrasound;Functional mobility training;Therapeutic activities;Neuromuscular re-education;Therapeutic exercise;Patient/family education;Manual techniques;Passive range of motion;Dry needling;Splinting;Scar mobilization;Spinal Manipulations;Joint Manipulations   ? PT Next Visit Plan Update LLLD stretches for shoulder and FU on recently given   ? PT Home Exercise Plan Elbow flexion/extension, gripping, pendulums; pulley walkouts for shoulder ABD/distract stretch, wide countertop hip hinge pec/scaption stretch   ? Consulted and Agree with Plan of Care Patient   ? ?  ?  ? ?  ? ? ?Patient will benefit from  skilled therapeutic intervention in order to improve the following deficits and impairments:  Decreased activity tolerance, Decreased endurance, Decreased range of motion, Decreased strength, Hypomobility, Increased fascial restricitons, Impaired UE functional use, Pain, Decreased coordination, Decreased mobility, Postural dysfunction ? ?Visit Diagnosis: ?Chronic right shoulder pain ? ? ? ? ?Problem List ?Patient Active Problem List  ? Diagnosis Date Noted  ? Elevated bilirubin 06/21/2021  ? ETOH abuse 06/21/2021  ? Complete tear of right rotator cuff 03/22/2021  ? Right shoulder pain 01/30/2021  ? Primary hypertension 01/30/2021  ? Non-seasonal allergic rhinitis 02/14/2020  ? Chest pain, non-cardiac 01/07/2020  ? Pulmonary nodules 01/07/2020  ? Essential hypertension 12/09/2019  ? Elevated serum creatinine 11/27/2019  ? Elevated BP without diagnosis of hypertension 11/27/2019  ? Breast pain, left 11/27/2019  ? Rectal bleeding 11/04/2019  ? Gastroesophageal reflux disease 11/04/2019  ? Chest pain 11/04/2019  ? SOB (shortness of breath) 11/04/2019  ? BMI 30.0-30.9,adult 11/04/2019  ? Encounter for HCV screening test for low risk patient 11/04/2019  ? Screening for HIV (human immunodeficiency virus) 11/04/2019  ? Encounter for screening and preventative care 11/04/2019  ? Tobacco chew  use 11/04/2019  ? ?10:44 AM, 06/22/21 ?Etta Grandchild, PT, DPT ?Physical Therapist - [place] ?[phone] (Office) ? ? ?Craig Finley, PT ?06/22/2021, 10:30 AM ? ?Craig Finley ?[place]

## 2021-06-22 NOTE — Addendum Note (Signed)
Addended by: Rosamaria Lints on: 06/22/2021 12:25 PM ? ? Modules accepted: Orders ? ?

## 2021-06-25 ENCOUNTER — Encounter: Payer: 59 | Admitting: Physical Therapy

## 2021-06-26 ENCOUNTER — Ambulatory Visit
Admission: RE | Admit: 2021-06-26 | Discharge: 2021-06-26 | Disposition: A | Discharge status: Home / Self Care | Payer: 59 | Source: Ambulatory Visit | Attending: Internal Medicine | Admitting: Internal Medicine

## 2021-06-26 DIAGNOSIS — F1010 Alcohol abuse, uncomplicated: Secondary | ICD-10-CM | POA: Diagnosis present

## 2021-06-26 DIAGNOSIS — R17 Unspecified jaundice: Secondary | ICD-10-CM | POA: Diagnosis present

## 2021-06-27 ENCOUNTER — Ambulatory Visit: Payer: 59 | Attending: Internal Medicine

## 2021-06-27 ENCOUNTER — Telehealth: Payer: Self-pay

## 2021-06-27 DIAGNOSIS — R160 Hepatomegaly, not elsewhere classified: Secondary | ICD-10-CM

## 2021-06-27 DIAGNOSIS — G8929 Other chronic pain: Secondary | ICD-10-CM | POA: Insufficient documentation

## 2021-06-27 DIAGNOSIS — M25511 Pain in right shoulder: Secondary | ICD-10-CM | POA: Insufficient documentation

## 2021-06-27 NOTE — Telephone Encounter (Signed)
-----   Message from Loura Pardon, MD sent at 06/27/2021  9:21 AM EDT ----- ?Can we pl check is I have alread referred pt to GI needs a fu with them if he hasnt been referred already thnx. Diagnosis -  Hepatic mass  ?

## 2021-06-27 NOTE — Progress Notes (Signed)
Can we pl check is I have alread referred pt to GI needs a fu with them if he hasnt been referred already thnx. Diagnosis -  Hepatic mass

## 2021-06-27 NOTE — Therapy (Signed)
[facility] ?[address] ?[place], [place], [address] ?Phone: [phone]   Fax:  [phone] ? ?Physical Therapy Treatment ? ?Patient Details  ?Name: Craig Finley ?MRN: [id number] ?Date of Birth: 1961/03/09 ?Referring Provider (PT): Vanetta Mulders ? ? ?Encounter Date: 06/27/2021 ? ? PT End of Session - 06/27/21 1007   ? ? Visit Number 14   ? Number of Visits 41   ? Date for PT Re-Evaluation 09/14/21   ? Authorization Type [employer] Generic   ? Authorization Time Period 04/26/21-06/22/21, 3/31/223-09/14/21   ? Progress Note Due on Visit 20   ? PT Start Time 1002   ? PT Stop Time [id number]   ? PT Time Calculation (min) 40 min   ? Activity Tolerance Patient tolerated treatment well;No increased pain   ? Behavior During Therapy [facility] for tasks assessed/performed   ? ?  ?  ? ?  ? ? ?Past Medical History:  ?Diagnosis Date  ? Allergy   ? Blood in stool   ? Chest pain   ? a. 11/2019 Cor CTA: Ca2 = 0. Nl cors.  ? CKD (chronic kidney disease) stage 2, GFR 60-89 ml/min   ? GERD (gastroesophageal reflux disease)   ? Heart murmur   ? as a child:  ? Hypertension   ? Pulmonary nodules   ? a. 11/2019 Cor CTA: Small L lung nodules - largest 16mm - incidental finding; b. 12/2020 Chest CT: Sm bilat pulm nodules - unchanged.  ? ? ?Past Surgical History:  ?Procedure Laterality Date  ? ANAL FISSURE REPAIR  05/1998  ? some type of anal fissure procedure  ? ARTHOSCOPIC ROTAOR CUFF REPAIR Right 04/23/2021  ? Procedure: Right ARTHROSCOPIC ROTATOR CUFF REPAIR;  Surgeon: Vanetta Mulders, MD;  Location: [facility];  Service: Orthopedics;  Laterality: Right;  ? COLONOSCOPY WITH PROPOFOL N/A 01/31/2020  ? Procedure: COLONOSCOPY WITH PROPOFOL;  Surgeon: Lin Landsman, MD;  Location: [facility] ENDOSCOPY;  Service: Gastroenterology;  Laterality: N/A;  ? ESOPHAGOGASTRODUODENOSCOPY (EGD) WITH PROPOFOL N/A 01/31/2020  ? Procedure: ESOPHAGOGASTRODUODENOSCOPY (EGD) WITH PROPOFOL;  Surgeon: Lin Landsman, MD;  Location: [facility] ENDOSCOPY;  Service: Gastroenterology;  Laterality: N/A;  ? ? ?There were no vitals filed for this visit. ? ? Subjective Assessment - 06/27/21 1007   ? ? Subjective Pt doing well today. Sunday night into Monday his shoulder pain was quite aggravated and distruptive to sleep. HEP going well.   ? Pertinent History Craig Finley is a 16yoM who is referred to OPPT for rehabilitzation s/p Rt shoulder arthroscopic supraspinatus repair.   ? Currently in Pain? No/denies   ? ?  ?  ? ?  ? ? ?INTERVENTION THIS DATE: ? ?-big blue rollouts wide 20x3secH ,3x45sec (AA/ROM flexion bilat)  ?-big blue rollouts Right abduction 15x3secH, 2x45sec ?-supine LLLD Rt shoulder ABDCT treadmill arm hang on rollystool 2x45sec ? ?MFR, ART, axial traction- infraspinatus, teres major, teres major, anterior deltoid, coracobrachialis (very twitchy to light pressure)  ?Supine RUE ER stretching 3x60sec (ABDCT to 65, horizontal ABDCT to 70), follows by A/ROM GHJ only 15x ? ?-isotmetric Rt shoulder add pillow squeeze 1x12x3secH, isometric IR into Left fist gentle/low exertion 12x3secH  ? ? ? ? ? ? PT Education - 06/27/21 1010   ? ? Education Details continued with ROM work   ? Person(s) Educated Patient   ? Methods Explanation   ? Comprehension Verbalized understanding   ? ?  ?  ? ?  ? ? ? PT  Short Term Goals - 06/12/21 1641   ? ?  ? PT SHORT TERM GOAL #1  ? Title Pt will demonstrate independence with HEP to improve R shoulder function for increased ability to participate with ADLs.   ? Baseline 04/26/21 HEP given   ? Time 4   ? Period Weeks   ? Status Achieved   ? Target Date 05/24/21   ? ?  ?  ? ?  ? ? ? ? PT Long Term Goals - 06/12/21 1642   ? ?  ? PT LONG TERM GOAL #1  ? Title Pt will increase R shoulder abduction and flexion ROM to >170 degrees in order to complete functional ADL   ? Baseline 04/26/21 unable to assess; 06/12/21: 85-95 degrees   ? Time 8   ? Period Weeks   ? Status On-going   ? Target Date 06/21/21   ?  ? PT  LONG TERM GOAL #2  ? Title Pt will be able to complete Apley scratch test on Rt shoulder and touch inferior border of left scapula to show clinically significant increase in IR to shower independently.   ? Baseline 04/26/21 unable to access; 3/21: ROM stil very limited, max effort to bring Rt thumb to central spine.   ? Time 8   ? Period Weeks   ? Status On-going   ? Target Date 06/21/21   ?  ? PT LONG TERM GOAL #3  ? Title Pt will increase R grip dynomometer score by to 85# to demonstrate PLOF strength evidenced by uninvolved side needed for heavy household tasks.   ? Baseline 04/26/21 R: 25lbs   ? Time 8   ? Period Weeks   ? Status On-going   ? Target Date 06/21/21   ?  ? PT LONG TERM GOAL #4  ? Title Pt will demonstrate gross R shoulder and elbow strength of 5/5 in order to demonstrate PLOF strength for heavy household ADLs and return to sport   ? Baseline 04/26/21 no test performed   ? Time 8   ? Period Weeks   ? Status On-going   ? Target Date 03/20/21   ? ?  ?  ? ?  ? ? ? ? ? ? ? ? Plan - 06/27/21 1038   ? ? Clinical Impression Statement ROM limitations remain largely unchanged, however limiting more aggressive stretch strategies to later in protocol. PT tolerates session well in general. Manual therapies to maximize humerus on scapula movement. New postures for self stretch show improved ABDCT range with similar discomfort and more stretch sensation.   ? Personal Factors and Comorbidities Past/Current Experience;Age   ? Examination-Activity Limitations Carry;Lift;Bed Mobility;Sleep;Dressing;Reach Overhead;Hygiene/Grooming   ? Examination-Participation Restrictions Driving;Cleaning;Meal Prep;Yard Work;Community Activity;Laundry;Occupation   ? Stability/Clinical Decision Making Evolving/Moderate complexity   ? Clinical Decision Making Moderate   ? Rehab Potential Good   ? PT Frequency 2x / week   ? PT Duration 8 weeks   ? PT Treatment/Interventions ADLs/Self Care Home Management;Cryotherapy;Electrical  Stimulation;Iontophoresis 4mg /ml Dexamethasone;Moist Heat;Traction;Ultrasound;Functional mobility training;Therapeutic activities;Neuromuscular re-education;Therapeutic exercise;Patient/family education;Manual techniques;Passive range of motion;Dry needling;Splinting;Scar mobilization;Spinal Manipulations;Joint Manipulations   ? PT Next Visit Plan Update LLLD stretches for shoulder and FU on recently given   ? PT Home Exercise Plan Elbow flexion/extension, gripping, pendulums; pulley walkouts for shoulder ABD/distract stretch, wide countertop hip hinge pec/scaption stretch   ? Consulted and Agree with Plan of Care Patient   ? ?  ?  ? ?  ? ? ?Patient will benefit from skilled  therapeutic intervention in order to improve the following deficits and impairments:  Decreased activity tolerance, Decreased endurance, Decreased range of motion, Decreased strength, Hypomobility, Increased fascial restricitons, Impaired UE functional use, Pain, Decreased coordination, Decreased mobility, Postural dysfunction ? ?Visit Diagnosis: ?Chronic right shoulder pain ? ? ? ? ?Problem List ?Patient Active Problem List  ? Diagnosis Date Noted  ? Elevated bilirubin 06/21/2021  ? ETOH abuse 06/21/2021  ? Complete tear of right rotator cuff 03/22/2021  ? Right shoulder pain 01/30/2021  ? Primary hypertension 01/30/2021  ? Non-seasonal allergic rhinitis 02/14/2020  ? Chest pain, non-cardiac 01/07/2020  ? Pulmonary nodules 01/07/2020  ? Essential hypertension 12/09/2019  ? Elevated serum creatinine 11/27/2019  ? Elevated BP without diagnosis of hypertension 11/27/2019  ? Breast pain, left 11/27/2019  ? Rectal bleeding 11/04/2019  ? Gastroesophageal reflux disease 11/04/2019  ? Chest pain 11/04/2019  ? SOB (shortness of breath) 11/04/2019  ? BMI 30.0-30.9,adult 11/04/2019  ? Encounter for HCV screening test for low risk patient 11/04/2019  ? Screening for HIV (human immunodeficiency virus) 11/04/2019  ? Encounter for screening and preventative  care 11/04/2019  ? Tobacco chew use 11/04/2019  ?10:48 AM, 06/27/21 ?Etta Grandchild, PT, DPT ?Physical Therapist - [place] ?[phone] (Office) ? ? ? ?Spencer Cardinal C, PT ?06/27/2021, 10:39 AM ? ?[place] ?[facility]

## 2021-07-03 ENCOUNTER — Ambulatory Visit: Payer: 59

## 2021-07-03 DIAGNOSIS — M25511 Pain in right shoulder: Secondary | ICD-10-CM | POA: Diagnosis not present

## 2021-07-03 DIAGNOSIS — G8929 Other chronic pain: Secondary | ICD-10-CM

## 2021-07-03 NOTE — Therapy (Signed)
[facility] ?[address] ?[place], [place], [address] ?Phone: [phone]   Fax:  [phone] ? ?Physical Therapy Treatment ? ?Patient Details  ?Name: Craig Finley ?MRN: [id number] ?Date of Birth: December 19, 1960 ?Referring Provider (PT): Vanetta Mulders ? ? ?Encounter Date: 07/03/2021 ? ? PT End of Session - 07/03/21 1232   ? ? Visit Number 15   ? Number of Visits 41   ? Date for PT Re-Evaluation 09/14/21   ? Authorization Type [employer] Generic   ? Authorization Time Period 04/26/21-06/22/21, 3/31/223-09/14/21   ? PT Start Time 1145   ? PT Stop Time 1225   ? PT Time Calculation (min) 40 min   ? Activity Tolerance Patient tolerated treatment well;No increased pain   ? Behavior During Therapy [facility] for tasks assessed/performed   ? ?  ?  ? ?  ? ? ?Past Medical History:  ?Diagnosis Date  ? Allergy   ? Blood in stool   ? Chest pain   ? a. 11/2019 Cor CTA: Ca2 = 0. Nl cors.  ? CKD (chronic kidney disease) stage 2, GFR 60-89 ml/min   ? GERD (gastroesophageal reflux disease)   ? Heart murmur   ? as a child:  ? Hypertension   ? Pulmonary nodules   ? a. 11/2019 Cor CTA: Small L lung nodules - largest 53mm - incidental finding; b. 12/2020 Chest CT: Sm bilat pulm nodules - unchanged.  ? ? ?Past Surgical History:  ?Procedure Laterality Date  ? ANAL FISSURE REPAIR  05/1998  ? some type of anal fissure procedure  ? ARTHOSCOPIC ROTAOR CUFF REPAIR Right 04/23/2021  ? Procedure: Right ARTHROSCOPIC ROTATOR CUFF REPAIR;  Surgeon: Vanetta Mulders, MD;  Location: [facility];  Service: Orthopedics;  Laterality: Right;  ? COLONOSCOPY WITH PROPOFOL N/A 01/31/2020  ? Procedure: COLONOSCOPY WITH PROPOFOL;  Surgeon: Lin Landsman, MD;  Location: [facility] ENDOSCOPY;  Service: Gastroenterology;  Laterality: N/A;  ? ESOPHAGOGASTRODUODENOSCOPY (EGD) WITH PROPOFOL N/A 01/31/2020  ? Procedure: ESOPHAGOGASTRODUODENOSCOPY (EGD) WITH PROPOFOL;  Surgeon: Lin Landsman, MD;  Location: [facility]  ENDOSCOPY;  Service: Gastroenterology;  Laterality: N/A;  ? ? ?There were no vitals filed for this visit. ? ? Subjective Assessment - 07/03/21 1138   ? ? Subjective Pt doing well today, same mild achiness in shoulder. HEP going well in general.   ? Pertinent History Tel Catton is a 22yoM who is referred to OPPT for rehabilitzation s/p Rt shoulder arthroscopic supraspinatus repair.   ? Currently in Pain? Yes   ? Pain Score 2    operative shoulder  ? ?  ?  ? ?  ? ? ?Intervention this date:  ?-AA/ROM flexion pulleys 3x45sec, 1x15x2secH ?-isometric adduction into towel ?-isometric flexion, ER, IR, extension into towel roll ?-table slide ABDCT stretch  ? ?--------------------------------------------------------- ?INTERVENTION 06/27/21: ?  ?-big blue rollouts wide 20x3secH ,3x45sec (AA/ROM flexion bilat)  ?-big blue rollouts Right abduction 15x3secH, 2x45sec ?-supine LLLD Rt shoulder ABDCT treadmill arm hang on rollystool 2x45sec ?  ?MFR, ART, axial traction- infraspinatus, teres major, teres major, anterior deltoid, coracobrachialis (very twitchy to light pressure)  ?Supine RUE ER stretching 3x60sec (ABDCT to 65, horizontal ABDCT to 70), follows by A/ROM GHJ only 15x ?  ?-isotmetric Rt shoulder add pillow squeeze 1x12x3secH, isometric IR into Left fist gentle/low exertion 12x3secH  ? ?-------------------------------------------------------- ?INTERVENTION 06/22/21 ?-big blue rollouts wide 20x3secH ,3x45sec (AA/ROM flexion bilat)  ?-big blue rollouts Right abduction 15x3secH, 2x45sec ?  ?-supine LLLD Rt shoulder ABDCT  x5 minutes, heat under posterior shoulder (arm supported to promote more scapular plane)  ?-LAD in ABDCT 3x60sec ?-Supine Rue A/ROM in available range (125 degrees)  ?-Side lying RUE abdct A/ROM 1x12 dyskinesis after 80 degrees  ?-seated RUE ER A/ROM in 45* scaption, 45* flexion x15 ?-isotmetric Rt shoulder add pillow squeeze 1x12x3secH  ?-Rt elbow flexion 1x12  ? ? ? ?Access Code: [email or web address] (updated  07/03/21) ?URL: [email or web address] ?Date: 07/03/2021 ?Prepared by: Rebbeca Paul ? ?Exercises ?- Seated Shoulder Flexion AAROM with Pulley Behind  - 1 x daily - 7 x weekly - 3 sets - 10 reps ?- Standing Isometric Shoulder Adduction with Ball  - 1 x daily - 7 x weekly - 3 sets - 10 reps - 3s hold ?- Isometric Shoulder Extension at Wall  - 1 x daily - 7 x weekly - 3 sets - 10 reps - 3s hold ?- Standing Isometric Shoulder Internal Rotation at Doorway  - 1 x daily - 7 x weekly - 3 sets - 10 reps - 3s hold ?- Standing Isometric Shoulder External Rotation with Doorway  - 1 x daily - 7 x weekly - 3 sets - 10 reps - 3s hold ?- Isometric Shoulder Flexion at Wall  - 1 x daily - 7 x weekly - 3 sets - 10 reps - 3 hold ?- Seated Shoulder Abduction Towel Slide at Table Top with Forearm in Neutral  - 3 x daily - 7 x weekly - 1 sets - 3 reps - 90 hold ? ? ? ? PT Education - 07/03/21 1233   ? ? Education Details Reviewed updated HEP and protocol details   ? Person(s) Educated Patient   ? Methods Explanation;Handout   ? Comprehension Verbalized understanding;Returned demonstration   ? ?  ?  ? ?  ? ? ? PT Short Term Goals - 06/12/21 1641   ? ?  ? PT SHORT TERM GOAL #1  ? Title Pt will demonstrate independence with HEP to improve R shoulder function for increased ability to participate with ADLs.   ? Baseline 04/26/21 HEP given   ? Time 4   ? Period Weeks   ? Status Achieved   ? Target Date 05/24/21   ? ?  ?  ? ?  ? ? ? ? PT Long Term Goals - 06/12/21 1642   ? ?  ? PT LONG TERM GOAL #1  ? Title Pt will increase R shoulder abduction and flexion ROM to >170 degrees in order to complete functional ADL   ? Baseline 04/26/21 unable to assess; 06/12/21: 85-95 degrees   ? Time 8   ? Period Weeks   ? Status On-going   ? Target Date 06/21/21   ?  ? PT LONG TERM GOAL #2  ? Title Pt will be able to complete Apley scratch test on Rt shoulder and touch inferior border of left scapula to show clinically significant increase in IR to  shower independently.   ? Baseline 04/26/21 unable to access; 3/21: ROM stil very limited, max effort to bring Rt thumb to central spine.   ? Time 8   ? Period Weeks   ? Status On-going   ? Target Date 06/21/21   ?  ? PT LONG TERM GOAL #3  ? Title Pt will increase R grip dynomometer score by to 85# to demonstrate PLOF strength evidenced by uninvolved side needed for heavy household tasks.   ? Baseline 04/26/21 R: 25lbs   ? Time 8   ?  Period Weeks   ? Status On-going   ? Target Date 06/21/21   ?  ? PT LONG TERM GOAL #4  ? Title Pt will demonstrate gross R shoulder and elbow strength of 5/5 in order to demonstrate PLOF strength for heavy household ADLs and return to sport   ? Baseline 04/26/21 no test performed   ? Time 8   ? Period Weeks   ? Status On-going   ? Target Date 03/20/21   ? ?  ?  ? ?  ? ? ? ? ? ? ? ? Plan - 07/03/21 1234   ? ? Clinical Impression Statement This date makes 10 weeks 1 days postop. Majority of session spent updating HEP per protocol and performing with extensiv educaitonal cues. At present pulleys appear to be best way to acheive end range flexion/overhead reach and table slides are best targeting true ABDCT ROM. No pain exacerbation after session.   ? Personal Factors and Comorbidities Past/Current Experience;Age   ? Examination-Activity Limitations Carry;Lift;Bed Mobility;Sleep;Dressing;Reach Overhead;Hygiene/Grooming   ? Examination-Participation Restrictions Driving;Cleaning;Meal Prep;Yard Work;Community Activity;Laundry;Occupation   ? Stability/Clinical Decision Making Evolving/Moderate complexity   ? Clinical Decision Making Moderate   ? Rehab Potential Good   ? PT Frequency 2x / week   ? PT Duration 8 weeks   ? PT Treatment/Interventions ADLs/Self Care Home Management;Cryotherapy;Electrical Stimulation;Iontophoresis 4mg /ml Dexamethasone;Moist Heat;Traction;Ultrasound;Functional mobility training;Therapeutic activities;Neuromuscular re-education;Therapeutic exercise;Patient/family  education;Manual techniques;Passive range of motion;Dry needling;Splinting;Scar mobilization;Spinal Manipulations;Joint Manipulations   ? PT Next Visit Plan Talk through any hiccups with new HEP last session. begine A/ROM here.

## 2021-07-05 ENCOUNTER — Ambulatory Visit: Payer: 59

## 2021-07-05 DIAGNOSIS — M25511 Pain in right shoulder: Secondary | ICD-10-CM | POA: Diagnosis not present

## 2021-07-05 DIAGNOSIS — G8929 Other chronic pain: Secondary | ICD-10-CM

## 2021-07-05 NOTE — Therapy (Signed)
[facility] ?[address] ?[place], [place], [address] ?Phone: [phone]   Fax:  [phone] ? ?Physical Therapy Treatment ? ?Patient Details  ?Name: [facility] ?MRN: [id number] ?Date of Birth: 12/13/1960 ?Referring Provider (PT): Vanetta Mulders ? ? ?Encounter Date: 07/05/2021 ? ? PT End of Session - 07/05/21 1140   ? ? Visit Number 16   ? Number of Visits 41   ? Date for PT Re-Evaluation 09/14/21   ? Authorization Type [employer] Generic- VL of 30 per calendar year   ? Authorization Time Period 04/26/21-06/22/21, 3/31/223-09/14/21   ? Authorization - Visit Number 15   ? Authorization - Number of Visits 30   ? Progress Note Due on Visit 20   ? PT Start Time 1136   ? PT Stop Time [id number]   ? PT Time Calculation (min) 38 min   ? Activity Tolerance Patient tolerated treatment well;No increased pain   ? Behavior During Therapy [facility] for tasks assessed/performed   ? ?  ?  ? ?  ? ? ?Past Medical History:  ?Diagnosis Date  ? Allergy   ? Blood in stool   ? Chest pain   ? a. 11/2019 Cor CTA: Ca2 = 0. Nl cors.  ? CKD (chronic kidney disease) stage 2, GFR 60-89 ml/min   ? GERD (gastroesophageal reflux disease)   ? Heart murmur   ? as a child:  ? Hypertension   ? Pulmonary nodules   ? a. 11/2019 Cor CTA: Small L lung nodules - largest 39mm - incidental finding; b. 12/2020 Chest CT: Sm bilat pulm nodules - unchanged.  ? ? ?Past Surgical History:  ?Procedure Laterality Date  ? ANAL FISSURE REPAIR  05/1998  ? some type of anal fissure procedure  ? ARTHOSCOPIC ROTAOR CUFF REPAIR Right 04/23/2021  ? Procedure: Right ARTHROSCOPIC ROTATOR CUFF REPAIR;  Surgeon: Vanetta Mulders, MD;  Location: [facility];  Service: Orthopedics;  Laterality: Right;  ? COLONOSCOPY WITH PROPOFOL N/A 01/31/2020  ? Procedure: COLONOSCOPY WITH PROPOFOL;  Surgeon: Lin Landsman, MD;  Location: [facility] ENDOSCOPY;  Service: Gastroenterology;  Laterality: N/A;  ? ESOPHAGOGASTRODUODENOSCOPY (EGD) WITH  PROPOFOL N/A 01/31/2020  ? Procedure: ESOPHAGOGASTRODUODENOSCOPY (EGD) WITH PROPOFOL;  Surgeon: Lin Landsman, MD;  Location: [facility] ENDOSCOPY;  Service: Gastroenterology;  Laterality: N/A;  ? ? ?There were no vitals filed for this visit. ? ? Subjective Assessment - 07/05/21 1139   ? ? Subjective Pt says HEP updates are gong well, no issues. Shoulder remains tigher than painful. He is anxious to begin higher level strengthening to regain function.   ? Pertinent History Craig Finley is a 65yoM who is referred to OPPT for rehabilitzation s/p Rt shoulder arthroscopic supraspinatus repair.   ? Currently in Pain? No/denies   ? ?  ?  ? ?  ? ? ? ?INTERVENTION THIS DATE: ?-overhead reach on pulleys 1x15, 2x45sec, blue ball abduction 2x45sec ?-supine lateral glide, axial stretch (no subjective stretch) ?-inferior glide at end range abduction 1x30sec grade 4  ? ?-Sidelying RUE shoulder ABDCT A/ROM 0-90 1x15 ?-sidelying RUE shoulder row 1x15 ?-Sidelying RUE shoulder ABDCT A/ROM 0-90 1x15 ?-sidelying RUE shoulder row 1x15 ?-Sidelying RUE shoulder ABDCT A/ROM 0-90 1x15 ?-sidelying RUE shoulder row 1x15 ? ?-Supine RUE scaption 0-90 1x15 ?-supine RUE IR/ER A/ROM in 1x20 (45 degrees ABDCT, 0 degrees flexion)  ?-Supine RUE scaption to abduction to scaption to neutral 0-90 1x15 ?-supine RUE IR/ER A/ROM in 1x20 (45 degrees ABDCT, 0 degrees flexion)  ?-  Supine RUE scaption to abduction to scaption to neutral 0-90 1x15 ?-supine RUE IR/ER A/ROM in 1x20 (45 degrees ABDCT, 0 degrees flexion)  ? ?-caudal glide Rt GHJ 3x30sec Grade 4, 75 degrees ABDCT  ? ? ? ? ? ? ? ? ? PT Education - 07/05/21 1144   ? ? Education Details new A/ROM activiites and form   ? Person(s) Educated Patient   ? Methods Explanation   ? Comprehension Verbalized understanding   ? ?  ?  ? ?  ? ? ? PT Short Term Goals - 06/12/21 1641   ? ?  ? PT SHORT TERM GOAL #1  ? Title Pt will demonstrate independence with HEP to improve R shoulder function for increased ability to  participate with ADLs.   ? Baseline 04/26/21 HEP given   ? Time 4   ? Period Weeks   ? Status Achieved   ? Target Date 05/24/21   ? ?  ?  ? ?  ? ? ? ? PT Long Term Goals - 06/12/21 1642   ? ?  ? PT LONG TERM GOAL #1  ? Title Pt will increase R shoulder abduction and flexion ROM to >170 degrees in order to complete functional ADL   ? Baseline 04/26/21 unable to assess; 06/12/21: 85-95 degrees   ? Time 8   ? Period Weeks   ? Status On-going   ? Target Date 06/21/21   ?  ? PT LONG TERM GOAL #2  ? Title Pt will be able to complete Apley scratch test on Rt shoulder and touch inferior border of left scapula to show clinically significant increase in IR to shower independently.   ? Baseline 04/26/21 unable to access; 3/21: ROM stil very limited, max effort to bring Rt thumb to central spine.   ? Time 8   ? Period Weeks   ? Status On-going   ? Target Date 06/21/21   ?  ? PT LONG TERM GOAL #3  ? Title Pt will increase R grip dynomometer score by to 85# to demonstrate PLOF strength evidenced by uninvolved side needed for heavy household tasks.   ? Baseline 04/26/21 R: 25lbs   ? Time 8   ? Period Weeks   ? Status On-going   ? Target Date 06/21/21   ?  ? PT LONG TERM GOAL #4  ? Title Pt will demonstrate gross R shoulder and elbow strength of 5/5 in order to demonstrate PLOF strength for heavy household ADLs and return to sport   ? Baseline 04/26/21 no test performed   ? Time 8   ? Period Weeks   ? Status On-going   ? Target Date 03/20/21   ? ?  ?  ? ?  ? ? ? ? ? ? ? ? Plan - 07/05/21 1152   ? ? Clinical Impression Statement Isometric HEP established last session, now in full effect. ROM remains a heavy focus. Today transitioning to more A/ROM in appropriate ranges and positions. ROM limitations remain apparent. No pain exacerbation.   ? Personal Factors and Comorbidities Past/Current Experience;Age   ? Examination-Activity Limitations Carry;Lift;Bed Mobility;Sleep;Dressing;Reach Overhead;Hygiene/Grooming   ? Examination-Participation  Restrictions Driving;Cleaning;Meal Prep;Yard Work;Community Activity;Laundry;Occupation   ? Stability/Clinical Decision Making Evolving/Moderate complexity   ? Clinical Decision Making Moderate   ? Rehab Potential Good   ? PT Frequency 2x / week   ? PT Duration 8 weeks   ? PT Treatment/Interventions ADLs/Self Care Home Management;Cryotherapy;Electrical Stimulation;Iontophoresis 4mg /ml Dexamethasone;Moist Heat;Traction;Ultrasound;Functional mobility training;Therapeutic activities;Neuromuscular re-education;Therapeutic  exercise;Patient/family education;Manual techniques;Passive range of motion;Dry needling;Splinting;Scar mobilization;Spinal Manipulations;Joint Manipulations   ? PT Next Visit Plan Continue to transitition toward A/ROM, continue focus on ROM work   ? PT Home Exercise Plan Access Code: [email or web address] (updated 4/11)   ? Consulted and Agree with Plan of Care Patient   ? ?  ?  ? ?  ? ? ?Patient will benefit from skilled therapeutic intervention in order to improve the following deficits and impairments:  Decreased activity tolerance, Decreased endurance, Decreased range of motion, Decreased strength, Hypomobility, Increased fascial restricitons, Impaired UE functional use, Pain, Decreased coordination, Decreased mobility, Postural dysfunction ? ?Visit Diagnosis: ?Chronic right shoulder pain ? ? ? ? ?Problem List ?Patient Active Problem List  ? Diagnosis Date Noted  ? Elevated bilirubin 06/21/2021  ? ETOH abuse 06/21/2021  ? Complete tear of right rotator cuff 03/22/2021  ? Right shoulder pain 01/30/2021  ? Primary hypertension 01/30/2021  ? Non-seasonal allergic rhinitis 02/14/2020  ? Chest pain, non-cardiac 01/07/2020  ? Pulmonary nodules 01/07/2020  ? Essential hypertension 12/09/2019  ? Elevated serum creatinine 11/27/2019  ? Elevated BP without diagnosis of hypertension 11/27/2019  ? Breast pain, left 11/27/2019  ? Rectal bleeding 11/04/2019  ? Gastroesophageal reflux disease 11/04/2019  ? Chest pain  11/04/2019  ? SOB (shortness of breath) 11/04/2019  ? BMI 30.0-30.9,adult 11/04/2019  ? Encounter for HCV screening test for low risk patient 11/04/2019  ? Screening for HIV (human immunodeficiency virus) 08/12/202

## 2021-07-09 NOTE — Telephone Encounter (Signed)
Not sure if he has a \\ffu  with GI or not pl let me know when you get here thnx.

## 2021-07-09 NOTE — Telephone Encounter (Signed)
Thnak you

## 2021-07-09 NOTE — Telephone Encounter (Signed)
Patient has appt with GI on Wed. 4/19 ?

## 2021-07-10 ENCOUNTER — Encounter: Payer: Self-pay | Admitting: Physical Therapy

## 2021-07-10 ENCOUNTER — Ambulatory Visit: Payer: 59 | Admitting: Internal Medicine

## 2021-07-10 ENCOUNTER — Ambulatory Visit: Payer: 59 | Admitting: Physical Therapy

## 2021-07-10 DIAGNOSIS — M25511 Pain in right shoulder: Secondary | ICD-10-CM | POA: Diagnosis not present

## 2021-07-10 DIAGNOSIS — G8929 Other chronic pain: Secondary | ICD-10-CM

## 2021-07-10 NOTE — Therapy (Signed)
[facility] ?[address] ?[place], [place], [address] ?Phone: [phone]   Fax:  [phone] ? ?Physical Therapy Treatment ? ?Patient Details  ?Name: Craig Finley ?MRN: [id number] ?Date of Birth: [birth year] ?Referring Provider (PT): Vanetta Mulders ? ? ?Encounter Date: 07/10/2021 ? ? PT End of Session - 07/10/21 1231   ? ? Visit Number 17   ? Number of Visits 41   ? Date for PT Re-Evaluation 09/14/21   ? Authorization Type [employer] Generic- VL of 30 per calendar year   ? Authorization Time Period 04/26/21-06/22/21, 3/31/223-09/14/21   ? Authorization - Visit Number 15   ? Authorization - Number of Visits 30   ? Progress Note Due on Visit 20   ? PT Start Time 1132   ? PT Stop Time 1218   ? PT Time Calculation (min) 46 min   ? Activity Tolerance Patient tolerated treatment well;No increased pain   ? Behavior During Therapy [facility] for tasks assessed/performed   ? ?  ?  ? ?  ? ? ?Past Medical History:  ?Diagnosis Date  ? Allergy   ? Blood in stool   ? Chest pain   ? a. 11/2019 Cor CTA: Ca2 = 0. Nl cors.  ? CKD (chronic kidney disease) stage 2, GFR 60-89 ml/min   ? GERD (gastroesophageal reflux disease)   ? Heart murmur   ? as a child:  ? Hypertension   ? Pulmonary nodules   ? a. 11/2019 Cor CTA: Small L lung nodules - largest 43mm - incidental finding; b. 12/2020 Chest CT: Sm bilat pulm nodules - unchanged.  ? ? ?Past Surgical History:  ?Procedure Laterality Date  ? ANAL FISSURE REPAIR  05/1998  ? some type of anal fissure procedure  ? ARTHOSCOPIC ROTAOR CUFF REPAIR Right 04/23/2021  ? Procedure: Right ARTHROSCOPIC ROTATOR CUFF REPAIR;  Surgeon: Vanetta Mulders, MD;  Location: [facility];  Service: Orthopedics;  Laterality: Right;  ? COLONOSCOPY WITH PROPOFOL N/A 01/31/2020  ? Procedure: COLONOSCOPY WITH PROPOFOL;  Surgeon: Lin Landsman, MD;  Location: [facility] ENDOSCOPY;  Service: Gastroenterology;  Laterality: N/A;  ? ESOPHAGOGASTRODUODENOSCOPY (EGD) WITH  PROPOFOL N/A 01/31/2020  ? Procedure: ESOPHAGOGASTRODUODENOSCOPY (EGD) WITH PROPOFOL;  Surgeon: Lin Landsman, MD;  Location: [facility];  Service: Gastroenterology;  Laterality: N/A;  ? ? ?There were no vitals filed for this visit. ? ? Subjective Assessment - 07/10/21 1136   ? ? Subjective Pt is compliant with HEP. Denies pain but does state a nagging feeling. States he would like to return to work next week and is speaking with his employer later today.   ? Pertinent History Craig Finley is a 13yoM who is referred to OPPT for rehabilitzation s/p Rt shoulder arthroscopic supraspinatus repair.   ? Currently in Pain? No/denies   ? ?  ?  ? ?  ? ? ? ?INTERVENTIONS: ? ?-overhead reach on pulleys 3x60 seconds ?-blue ball abduction 3x10 w/ 5 second hold  ?-blue ball roll out for shoulder flexion 3x10 w/ 5 second hold  ?-supine PROM to available end range GH flexion, scaption, abduction and ER x5 minutes ?-inferior glide at mid-range and end range abduction 3x30sec grade 3-4  ?  ?-Sidelying RUE shoulder ER 1# DB 3x10 ?-Sidelying RUE shoulder ABD 1# DB 3x10 ?-Prone row RUE 1# DB 3x10 ?-Prone Horizontal Abduction 3x10 ?-Prone Extension 1# DB 3x10 ? ? ?Next session ?-Elbow Flexion 5# DB 3x10 ?-Elbow Extension 1# DB 3x10 ?-Full Can in  Scapular Plane 1# DB 3x10 ?  ? ? ?Access Code: [email or web address] ?Exercises ?- Seated Shoulder Flexion AAROM with Pulley Behind  - 7 x weekly - 3 sets - 1 minute time ?- Seated Shoulder Abduction Towel Slide at Table Top  - 3 x weekly - 3 sets - 10 reps ?- Sidelying Shoulder ER with Towel and Dumbbell  - 3 x weekly - 3 sets - 10 reps ?- Sidelying Shoulder Abduction  - 3 x weekly - 3 sets - 10 reps ?- Prone Shoulder Row  - 3 x weekly - 3 sets - 10 reps ?- Prone Shoulder Horizontal Abduction  - 3 x weekly - 3 sets - 10 reps ?- Prone Shoulder Extension - Single Arm  - 3 x weekly - 3 sets - 10 reps ? ? ? ? ?Clinical Impression: Pt is pleasant and motivated throughout session. He is excited to begin  Phase 3 of the rehab protocol. Gentle strengthening using 1# DB and occasionally just BW of his RUE were performed. Targets muscles include GH and periscapular musculature. Cueing for increased scapular activation. He continues to demonstrate limited active and passive ROM. Educated patient to re-introduce ER ROM to HEP as it may have been forgotten over time. Pt agreeable. 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  ? ? ? ? ? ? ? PT Short Term Goals - 06/12/21 1641   ? ?  ? PT SHORT TERM GOAL #1  ? Title Pt will demonstrate independence with HEP to improve R shoulder function for increased ability to participate with ADLs.   ? Baseline 04/26/21 HEP given   ? Time 4   ? Period Weeks   ? Status Achieved   ? Target Date 05/24/21   ? ?  ?  ? ?  ? ? ? ? PT Long Term Goals - 06/12/21 1642   ? ?  ? PT LONG TERM GOAL #1  ? Title Pt will increase R shoulder abduction and flexion ROM to >170 degrees in order to complete functional ADL   ? Baseline 04/26/21 unable to assess; 06/12/21: 85-95 degrees   ? Time 8   ? Period Weeks   ? Status On-going   ? Target Date 06/21/21   ?  ? PT LONG TERM GOAL #2  ? Title Pt will be able to complete Apley scratch test on Rt shoulder and touch inferior border of left scapula to show clinically significant increase in IR to shower independently.   ? Baseline 04/26/21 unable to access; 3/21: ROM stil very limited, max effort to bring Rt thumb to central spine.   ? Time 8   ? Period Weeks   ? Status On-going   ? Target Date 06/21/21   ?  ? PT LONG TERM GOAL #3  ? Title Pt will increase R grip dynomometer score by to 85# to demonstrate PLOF strength evidenced by uninvolved side needed for heavy household tasks.   ? Baseline 04/26/21 R: 25lbs   ? Time 8   ? Period Weeks   ? Status On-going   ? Target Date 06/21/21   ?  ? PT LONG TERM GOAL #4  ? Title Pt will demonstrate gross R shoulder and elbow  strength of 5/5 in order to demonstrate PLOF strength for heavy household ADLs and return to sport   ? Baseline 04/26/21 no test performed   ? Time  8   ? Period Weeks   ? Status On-going   ? Target Date 03/20/21   ? ?  ?  ? ?  ? ? ? ? ? ? ? ? Plan - 07/10/21 1232   ? ? Clinical Impression Statement Pt is pleasant and motivated throughout session. He is excited to begin Phase 3 of the rehab protocol. Gentle strengthening using 1# DB and occasionally just BW of his RUE were performed. Targets muscles include GH and periscapular musculature. Cueing for increased scapular activation. He continues to demonstrate limited active and passive ROM. Educated patient to re-introduce ER ROM to HEP as it may have been forgotten over time. Pt agreeable. 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   ? Personal Factors and Comorbidities Past/Current Experience;Age   ? Examination-Activity Limitations Carry;Lift;Bed Mobility;Sleep;Dressing;Reach Overhead;Hygiene/Grooming   ? Examination-Participation Restrictions Driving;Cleaning;Meal Prep;Yard Work;Community Activity;Laundry;Occupation   ? Stability/Clinical Decision Making Evolving/Moderate complexity   ? Rehab Potential Good   ? PT Frequency 2x / week   ? PT Duration 8 weeks   ? PT Treatment/Interventions ADLs/Self Care Home Management;Cryotherapy;Electrical Stimulation;Iontophoresis 4mg /ml Dexamethasone;Moist Heat;Traction;Ultrasound;Functional mobility training;Therapeutic activities;Neuromuscular re-education;Therapeutic exercise;Patient/family education;Manual techniques;Passive range of motion;Dry needling;Splinting;Scar mobilization;Spinal Manipulations;Joint Manipulations   ? PT Next Visit Plan Continue to transitition toward A/ROM, continue focus on ROM work   ? PT Home Exercise Plan Access Code: [email or web address] (updated 4/11)   ? Consulted and Agree with Plan of Care Patient    ? ?  ?  ? ?  ? ? ?Patient will benefit from skilled therapeutic intervention in order to improve the following deficits and impairments:  Decreased activity tolerance, Decreased endurance, Decreased rang

## 2021-07-11 ENCOUNTER — Telehealth: Payer: Self-pay

## 2021-07-11 ENCOUNTER — Encounter: Payer: Self-pay | Admitting: Gastroenterology

## 2021-07-11 ENCOUNTER — Ambulatory Visit (INDEPENDENT_AMBULATORY_CARE_PROVIDER_SITE_OTHER): Payer: 59 | Admitting: Gastroenterology

## 2021-07-11 VITALS — BP 137/77 | HR 62 | Temp 98.4°F | Ht 66.75 in | Wt 211.0 lb

## 2021-07-11 DIAGNOSIS — R932 Abnormal findings on diagnostic imaging of liver and biliary tract: Secondary | ICD-10-CM | POA: Diagnosis not present

## 2021-07-11 DIAGNOSIS — K769 Liver disease, unspecified: Secondary | ICD-10-CM | POA: Diagnosis not present

## 2021-07-11 NOTE — Progress Notes (Signed)
? ? ?Gastroenterology Consultation ? ?Referring Provider:     Charlynne Cousins, MD ?Primary Care Physician:  Charlynne Cousins, MD ?Primary Gastroenterologist:  Dr. Allen Norris     ?Reason for Consultation:     Liver lesion ?      ? HPI:   ?Craig Finley is a 61 y.o. y/o male referred for consultation & management of Liver lesion by Dr. Neomia Dear, Loman Brooklyn, MD.  This patient comes in for evaluation of a abnormal finding on the patient's imaging of his liver.  The imaging done early this month showed: ? ?IMPRESSION: ?1. There is a mass measuring 7 x 6 x 9 mm in the anterior right ?hepatic lobe which is too small to completely characterize. This ?likely correlates with a small mass identified on the October 3, ?2022 study, unchanged. I suspect this mass is most likely a ?complicated cyst but a solid mass is not excluded. An MRI could more ?definitively characterize. Alternatively, an ultrasound in 6 months ?could ensure stability. ?2. Cysts in the liver. ? ?The patient had previously been seen by Dr. Marius Ditch for rectal bleeding heartburn and Constipation.  At that time the patient underwent an EGD and colonoscopy. Both of those exams did not show any concerning findings. ? ?Component ?    Latest Ref Rng 04/23/2021 06/21/2021  ?Total Bilirubin ?    0.0 - 1.2 mg/dL 3.1 (H)  0.6   ?Alkaline Phosphatase ?    44 - 121 IU/L 43  54   ?AST ?    0 - 40 IU/L 48 (H)  29   ?ALT ?    0 - 44 IU/L 31  40   ? ?The patient reports that he drinks beer on a regular basis and thinks he drinks close to 60 beers a week. ? ?Past Medical History:  ?Diagnosis Date  ? Allergy   ? Blood in stool   ? Chest pain   ? a. 11/2019 Cor CTA: Ca2 = 0. Nl cors.  ? CKD (chronic kidney disease) stage 2, GFR 60-89 ml/min   ? GERD (gastroesophageal reflux disease)   ? Heart murmur   ? as a child:  ? Hypertension   ? Pulmonary nodules   ? a. 11/2019 Cor CTA: Small L lung nodules - largest 108mm - incidental finding; b. 12/2020 Chest CT: Sm bilat pulm nodules - unchanged.   ? ? ?Past Surgical History:  ?Procedure Laterality Date  ? ANAL FISSURE REPAIR  05/1998  ? some type of anal fissure procedure  ? ARTHOSCOPIC ROTAOR CUFF REPAIR Right 04/23/2021  ? Procedure: Right ARTHROSCOPIC ROTATOR CUFF REPAIR;  Surgeon: Vanetta Mulders, MD;  Location: [facility];  Service: Orthopedics;  Laterality: Right;  ? COLONOSCOPY WITH PROPOFOL N/A 01/31/2020  ? Procedure: COLONOSCOPY WITH PROPOFOL;  Surgeon: Lin Landsman, MD;  Location: [facility] ENDOSCOPY;  Service: Gastroenterology;  Laterality: N/A;  ? ESOPHAGOGASTRODUODENOSCOPY (EGD) WITH PROPOFOL N/A 01/31/2020  ? Procedure: ESOPHAGOGASTRODUODENOSCOPY (EGD) WITH PROPOFOL;  Surgeon: Lin Landsman, MD;  Location: [facility] ENDOSCOPY;  Service: Gastroenterology;  Laterality: N/A;  ? ? ?Prior to Admission medications   ?Medication Sig Start Date End Date Taking? Authorizing Provider  ?acetaminophen (TYLENOL) 500 MG tablet Take 1,000 mg by mouth every 8 (eight) hours as needed for moderate pain.    [provider]  ?amLODipine (NORVASC) 5 MG tablet Take 1 tablet (5 mg total) by mouth daily. 04/10/21 07/09/21  Theora Gianotti, NP  ?Fexofenadine-Pseudoephedrine (ALLEGRA-D PO) Take 1 tablet by mouth daily as needed (allergies).  [provider]  ?omeprazole (PRILOSEC) 20 MG capsule Take 1 capsule (20 mg total) by mouth daily. ?Patient taking differently: Take 20 mg by mouth every other day. 01/30/21   Vigg, Avanti, MD  ?triamcinolone (NASACORT) 55 MCG/ACT AERO nasal inhaler Place 2 sprays into the nose daily. ?Patient taking differently: Place 2 sprays into the nose daily as needed (allergies). 02/14/20   Marval Regal, NP  ? ? ?Family History  ?Problem Relation Age of Onset  ? Arthritis Mother   ? Asthma Mother   ? Hypertension Mother   ? Hearing loss Father   ? Alcohol abuse Sister   ? Cancer Sister   ? Depression Sister   ? Drug abuse Sister   ? Early death Sister   ? Heart attack Sister   ? Hypertension Sister   ? Miscarriages  / Stillbirths Sister   ? Asthma Sister   ? Depression Sister   ? Heart disease Maternal Uncle   ? Heart disease Maternal Uncle   ? Alcohol abuse Maternal Grandmother   ? Cancer Maternal Grandmother   ? Cancer Maternal Grandfather   ? Asthma Daughter   ? Asthma Son   ?  ? ?Social History  ? ?Tobacco Use  ? Smoking status: Never  ? Smokeless tobacco: Former  ?  Types: Snuff  ?  Quit date: 04/02/2021  ? Tobacco comments:  ?  dipping x 25-30 years  ?Vaping Use  ? Vaping Use: Never used  ?Substance Use Topics  ? Alcohol use: Yes  ?  Comment: drinks 4-7 beers some days and none others. He drinks 24 12 oz can  beers per   ? Drug use: Never  ? ? ?Allergies as of 07/11/2021  ? (No Known Allergies)  ? ? ?Review of Systems:    ?All systems reviewed and negative except where noted in HPI. ? ? Physical Exam:  ?There were no vitals taken for this visit. ?No LMP for male patient. ?General:   Alert,  Well-developed, well-nourished, pleasant and cooperative in NAD ?Head:  Normocephalic and atraumatic. ?Eyes:  Sclera clear, no icterus.   Conjunctiva pink. ?Ears:  Normal auditory acuity. ?Neck:  Supple; no masses or thyromegaly. ?Lungs:  Respirations even and unlabored.  Clear throughout to auscultation.   No wheezes, crackles, or rhonchi. No acute distress. ?Heart:  Regular rate and rhythm; no murmurs, clicks, rubs, or gallops. ?Abdomen:  Normal bowel sounds.  No bruits.  Soft, non-tender and non-distended without masses, hepatosplenomegaly or hernias noted.  No guarding or rebound tenderness.  Negative Carnett sign.   ?Rectal:  Deferred.  ?Pulses:  Normal pulses noted. ?Extremities:  No clubbing or edema.  No cyanosis. ?Neurologic:  Alert and oriented x3;  grossly normal neurologically. ?Skin:  Intact without significant lesions or rashes.  No jaundice. ?Lymph Nodes:  No significant cervical adenopathy. ?Psych:  Alert and cooperative. Normal mood and affect. ? ?Imaging Studies: ?US Abdomen Limited RUQ (LIVER/GB) ? ?Result Date:  06/26/2021 ?CLINICAL DATA:  Elevated bilirubin.  Alcohol abuse. EXAM: ULTRASOUND ABDOMEN LIMITED RIGHT UPPER QUADRANT COMPARISON:  CT scan December 25, 2020 FINDINGS: Gallbladder: No gallstones or wall thickening visualized. No sonographic Murphy sign noted by sonographer. Common bile duct: Diameter: 3.5 mm Liver: Multiple cysts are identified in the liver. A mildly complicated cyst with a single thin septation is identified in the right hepatic lobe measuring 2.2 x 3.1 x 2.8 cm. A small hypoechoic mass in the right hepatic lobe anteriorly measures 7 x 6 x 9 mm,  nonspecific on today's imaging. A mildly complicated cyst with a thin septation is also identified in the right hepatic lobe measuring 2.0 x 1.6 x 1.7 cm. Portal vein is patent on color Doppler imaging with normal direction of blood flow towards the liver. Other: None. IMPRESSION: 1. There is a mass measuring 7 x 6 x 9 mm in the anterior right hepatic lobe which is too small to completely characterize. This likely correlates with a small mass identified on the December 25, 2020 study, unchanged. I suspect this mass is most likely a complicated cyst but a solid mass is not excluded. An MRI could more definitively characterize. Alternatively, an ultrasound in 6 months could ensure stability. 2. Cysts in the liver. Electronically Signed   By: Dorise Bullion III M.D.   On: 06/26/2021 19:11   ? ?Assessment and Plan:  ? ?Meco Schlafer Spangler is a 61 y.o. y/o male who comes in today with a history of excessive beer intake and has been told to cut down on his drinking.  The patient was found to have a lesion on his liver that could be a solid mass versus a complex cyst.  The patient was recommended on his CT scan to undergo an MRI.  The patient will be set up for an MRI of the liver to better delineate the lesion.  The patient has been explained the plan agrees with it. ? ? ? ?Lucilla Lame, MD. Marval Regal ? ? ? Note: This dictation was prepared with Dragon dictation along  with smaller phrase technology. Any transcriptional errors that result from this process are unintentional.   ?

## 2021-07-11 NOTE — Telephone Encounter (Signed)
Scheduled for May 4 [facility], arrive at 12:30pm and have nothing to eat or drink after 8:30am ? ?Left detailed msg on VM per HIPAA ? ?

## 2021-07-12 ENCOUNTER — Ambulatory Visit: Payer: 59 | Admitting: Internal Medicine

## 2021-07-12 ENCOUNTER — Encounter: Payer: Self-pay | Admitting: Physical Therapy

## 2021-07-12 ENCOUNTER — Encounter: Payer: Self-pay | Admitting: Internal Medicine

## 2021-07-12 ENCOUNTER — Ambulatory Visit: Payer: 59 | Admitting: Physical Therapy

## 2021-07-12 VITALS — BP 115/77 | HR 56 | Temp 98.7°F | Ht 66.73 in | Wt 209.6 lb

## 2021-07-12 DIAGNOSIS — G8929 Other chronic pain: Secondary | ICD-10-CM

## 2021-07-12 DIAGNOSIS — M25511 Pain in right shoulder: Secondary | ICD-10-CM | POA: Diagnosis not present

## 2021-07-12 DIAGNOSIS — R17 Unspecified jaundice: Secondary | ICD-10-CM | POA: Diagnosis not present

## 2021-07-12 NOTE — Progress Notes (Signed)
? ?BP 115/77   Pulse (!) 56   Temp 98.7 ?F (37.1 ?C) (Oral)   Ht 5' 6.73" (1.695 m)   Wt 209 lb 9.6 oz (95.1 kg)   SpO2 96%   BMI 33.09 kg/m?   ? ?Subjective:  ? ? Patient ID: Craig Finley, male    DOB: 1961-03-09, 61 y.o.   MRN: [id number] ? ?Chief Complaint  ?Patient presents with  ? Abnormal bilirubin  ? ? ?HPI: ?Craig Finley is a 61 y.o. male ? ?Shoulder Pain  ?This is a chronic (had an injury playing softball.) problem.  ? ?Chief Complaint  ?Patient presents with  ? Abnormal bilirubin  ? ? ?Relevant past medical, surgical, family and social history reviewed and updated as indicated. Interim medical history since our last visit reviewed. ?Allergies and medications reviewed and updated. ? ?Review of Systems ? ?Per HPI unless specifically indicated above ? ?   ?Objective:  ?  ?BP 115/77   Pulse (!) 56   Temp 98.7 ?F (37.1 ?C) (Oral)   Ht 5' 6.73" (1.695 m)   Wt 209 lb 9.6 oz (95.1 kg)   SpO2 96%   BMI 33.09 kg/m?   ?Wt Readings from Last 3 Encounters:  ?07/12/21 209 lb 9.6 oz (95.1 kg)  ?07/11/21 211 lb (95.7 kg)  ?06/21/21 210 lb 9.6 oz (95.5 kg)  ?  ?Physical Exam ? ?Results for orders placed or performed in visit on 06/21/21  ?Comprehensive metabolic panel  ?Result Value Ref Range  ? Glucose 88 70 - 99 mg/dL  ? BUN 15 8 - 27 mg/dL  ? Creatinine, Ser 1.24 0.76 - 1.27 mg/dL  ? eGFR 67 >59 mL/min/1.73  ? BUN/Creatinine Ratio 12 10 - 24  ? Sodium 141 134 - 144 mmol/L  ? Potassium 4.0 3.5 - 5.2 mmol/L  ? Chloride 102 96 - 106 mmol/L  ? CO2 24 20 - 29 mmol/L  ? Calcium 9.6 8.6 - 10.2 mg/dL  ? Total Protein 7.2 6.0 - 8.5 g/dL  ? Albumin 4.6 3.8 - 4.9 g/dL  ? Globulin, Total 2.6 1.5 - 4.5 g/dL  ? Albumin/Globulin Ratio 1.8 1.2 - 2.2  ? Bilirubin Total 0.6 0.0 - 1.2 mg/dL  ? Alkaline Phosphatase 54 44 - 121 IU/L  ? AST 29 0 - 40 IU/L  ? ALT 40 0 - 44 IU/L  ?Bilirubin, Direct  ?Result Value Ref Range  ? Bilirubin, Direct 0.17 0.00 - 0.40 mg/dL  ?Hepatitis A antibody, IgM  ?Result  Value Ref Range  ? Hep A IgM Negative Negative  ?Hepatitis B Surface AntiGEN  ?Result Value Ref Range  ? Hepatitis B Surface Ag Negative Negative  ?Hepatitis B Surface AntiBODY  ?Result Value Ref Range  ? Hep B Surface Ab, Qual Non Reactive   ?Hepatitis C Antibody  ?Result Value Ref Range  ? Hep C Virus Ab Non Reactive Non Reactive  ? ?   ? ? ?Current Outpatient Medications:  ?  acetaminophen (TYLENOL) 500 MG tablet, Take 1,000 mg by mouth every 8 (eight) hours as needed for moderate pain., Disp: , Rfl:  ?  Fexofenadine-Pseudoephedrine (ALLEGRA-D PO), Take 1 tablet by mouth daily as needed (allergies)., Disp: , Rfl:  ?  omeprazole (PRILOSEC) 20 MG capsule, Take 1 capsule (20 mg total) by mouth daily. (Patient taking differently: Take 20 mg by mouth every other day.), Disp: 30 capsule, Rfl: 3 ?  triamcinolone (NASACORT) 55 MCG/ACT AERO nasal inhaler, Place 2 sprays into the nose daily. (Patient  taking differently: Place 2 sprays into the nose daily as needed (allergies).), Disp: 1 each, Rfl: 4 ?  amLODipine (NORVASC) 5 MG tablet, Take 1 tablet (5 mg total) by mouth daily., Disp: 90 tablet, Rfl: 3  ? ? ?Assessment & Plan:  ?Ct abdomen " ?1. There is a mass measuring 7 x 6 x 9 mm in the anterior right ?hepatic lobe which is too small to completely characterize. This ?likely correlates with a small mass identified on the October 3, ?2022 study, unchanged. I suspect this mass is most likely a ?complicated cyst but a solid mass is not excluded. An MRI could more ?definitively characterize. Alternatively, an ultrasound in 6 months ?could ensure stability. ?2. Cysts in the liver. ? ?Elevated bilirubin  and ETOH abuse stopped drinking, hasnt had anything to drink x 1-2 weeks.  ?Has had 1-2 beers previously and then stopped. ? ?Right shoulder rotator cuff repair january 30 th @ [place] he has seen dr. Sammuel Hines ortho for such  ?Has had PT still attends now.  ?  ?Htn is on norvsac for such ?Continue current meds.  Medication  compliance emphasised. pt advised to keep Bp logs. Pt verbalised understanding of the same. Pt to have a low salt diet . Exercise to reach a goal of at least 150 mins a week.  lifestyle modifications explained and pt understands importance of the above. ?Under good control on current regimen. Continue current regimen. Continue to monitor. Call with any concerns. Refills given. Labs drawn today. ?  ?Follow up plan: ?No follow-ups on file. ?

## 2021-07-12 NOTE — Therapy (Signed)
[facility] ?[address] ?[place], [place], [address] ?Phone: [phone]   Fax:  [phone] ? ?Physical Therapy Treatment ? ?Patient Details  ?Name: Craig Finley ?MRN: [id number] ?Date of Birth: 09-22-1960 ?Referring Provider (PT): Vanetta Mulders ? ? ?Encounter Date: 07/12/2021 ? ? PT End of Session - 07/12/21 1136   ? ? Visit Number 18   ? Number of Visits 41   ? Date for PT Re-Evaluation 09/14/21   ? Authorization Type [employer] Generic- VL of 30 per calendar year   ? Authorization Time Period 04/26/21-06/22/21, 3/31/223-09/14/21   ? Authorization - Visit Number 15   ? Authorization - Number of Visits 30   ? Progress Note Due on Visit 20   ? PT Start Time 1132   ? PT Stop Time 1217   ? PT Time Calculation (min) 45 min   ? Activity Tolerance Patient tolerated treatment well;No increased pain   ? Behavior During Therapy [facility] for tasks assessed/performed   ? ?  ?  ? ?  ? ? ?Past Medical History:  ?Diagnosis Date  ? Allergy   ? Blood in stool   ? Chest pain   ? a. 11/2019 Cor CTA: Ca2 = 0. Nl cors.  ? CKD (chronic kidney disease) stage 2, GFR 60-89 ml/min   ? GERD (gastroesophageal reflux disease)   ? Heart murmur   ? as a child:  ? Hypertension   ? Pulmonary nodules   ? a. 11/2019 Cor CTA: Small L lung nodules - largest 65mm - incidental finding; b. 12/2020 Chest CT: Sm bilat pulm nodules - unchanged.  ? ? ?Past Surgical History:  ?Procedure Laterality Date  ? ANAL FISSURE REPAIR  05/1998  ? some type of anal fissure procedure  ? ARTHOSCOPIC ROTAOR CUFF REPAIR Right 04/23/2021  ? Procedure: Right ARTHROSCOPIC ROTATOR CUFF REPAIR;  Surgeon: Vanetta Mulders, MD;  Location: [place];  Service: Orthopedics;  Laterality: Right;  ? COLONOSCOPY WITH PROPOFOL N/A 01/31/2020  ? Procedure: COLONOSCOPY WITH PROPOFOL;  Surgeon: Lin Landsman, MD;  Location: [facility] ENDOSCOPY;  Service: Gastroenterology;  Laterality: N/A;  ? ESOPHAGOGASTRODUODENOSCOPY (EGD) WITH  PROPOFOL N/A 01/31/2020  ? Procedure: ESOPHAGOGASTRODUODENOSCOPY (EGD) WITH PROPOFOL;  Surgeon: Lin Landsman, MD;  Location: [facility] ENDOSCOPY;  Service: Gastroenterology;  Laterality: N/A;  ? ? ?There were no vitals filed for this visit. ? ? Subjective Assessment - 07/12/21 1134   ? ? Subjective Pt is compliant with HEP. States he hopes to be cleared to return to work next week after his appointment with the MD on Monday.   ? Pertinent History Craig Finley is a 35yoM who is referred to OPPT for rehabilitzation s/p Rt shoulder arthroscopic supraspinatus repair.   ? Currently in Pain? No/denies   ? ?  ?  ? ?  ? ? ? ?R flexion: 148 ?R abduction: 139 ?R ER: 55 ? ? ?INTERVENTIONS: ?  ?-shoulder flexion on pulleys 2x60 seconds ?-shoulder abduction on pulleys 2x60 seconds  ?-supine PROM to available end range GH flexion, scaption, abduction and ER x5 minutes ?-inferior and posterior glides, grade 3-4, at mid to end range flexion, abduction, ER multiple bouts of 20 seconds. ?  ?-Sidelying RUE shoulder ER 1# DB 3x10 ?-Sidelying RUE shoulder ABD 1# DB 3x10 ?-Prone row RUE 1# DB 3x10 ?-Prone Horizontal Abduction 3x10 ?-Prone Extension 1# DB 3x10 ?-Full Can in Scapular Plane 1# DB 3x10 ?-Elbow Extension 1# DB 3x10 ? ? ?  ?Access Code:  [email or web address] ? ?Added:  ?- Single Arm Scaption with Dumbbell  - 3 x weekly - 3 sets - 10 reps ?- Standing Triceps Extension with Dumbbells  - 3 x weekly - 3 sets - 10 reps ?- Standing Single Arm Bicep Curls Supinated with Dumbbell  - 3 x weekly - 3 sets - 10 reps ?  ?  ?  ?Clinical Impression: Pt is pleasant and motivated throughout session. Continued POC including gentle strengthening using 1# DB. Added additional exercises to focus on deltoid and triceps. Pt demonstrates muscular fatigue by 3rd set of each exercise; he was able to complete all sets with full ROM. Pt to decrease sessions to 1x/week beginning next week in order to extend insurance covered visits. Pt consistent with HEP at home.   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   ? ? ? ? ? ? ? ? ? ? PT Short Term Goals - 06/12/21 1641   ? ?  ? PT SHORT TERM GOAL #1  ? Title Pt will demonstrate independence with HEP to improve R shoulder function for increased ability to participate with ADLs.   ? Baseline 04/26/21 HEP given   ? Time 4   ? Period Weeks   ? Status Achieved   ? Target Date 05/24/21   ? ?  ?  ? ?  ? ? ? ? PT Long Term Goals - 06/12/21 1642   ? ?  ? PT LONG TERM GOAL #1  ? Title Pt will increase R shoulder abduction and flexion ROM to >170 degrees in order to complete functional ADL   ? Baseline 04/26/21 unable to assess; 06/12/21: 85-95 degrees   ? Time 8   ? Period Weeks   ? Status On-going   ? Target Date 06/21/21   ?  ? PT LONG TERM GOAL #2  ? Title Pt will be able to complete Apley scratch test on Rt shoulder and touch inferior border of left scapula to show clinically significant increase in IR to shower independently.   ? Baseline 04/26/21 unable to access; 3/21: ROM stil very limited, max effort to bring Rt thumb to central spine.   ? Time 8   ? Period Weeks   ? Status On-going   ? Target Date 06/21/21   ?  ? PT LONG TERM GOAL #3  ? Title Pt will increase R grip dynomometer score by to 85# to demonstrate PLOF strength evidenced by uninvolved side needed for heavy household tasks.   ? Baseline 04/26/21 R: 25lbs   ? Time 8   ? Period Weeks   ? Status On-going   ? Target Date 06/21/21   ?  ? PT LONG TERM GOAL #4  ? Title Pt will demonstrate gross R shoulder and elbow strength of 5/5 in order to demonstrate PLOF strength for heavy household ADLs and return to sport   ? Baseline 04/26/21 no test performed   ? Time 8   ? Period Weeks   ? Status On-going   ? Target Date 03/20/21   ? ?  ?  ? ?  ? ? ? ? ? ? ? ? Plan - 07/12/21 1236   ? ? Clinical Impression Statement Pt is pleasant and motivated throughout session. Continued POC including  gentle strengthening using 1# DB. Added additional exercises to focus on deltoid and triceps. Pt demonstrates muscular fatigue by 3rd  set of each exercise; he was able to complete all sets with full ROM. Pt to decrease sessions to 1x/week beginning next week in order to extend insurance covered visits. Pt consistent with HEP at home.  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   ? Personal Factors and Comorbidities Past/Current Experience;Age   ? Examination-Activity Limitations Carry;Lift;Bed Mobility;Sleep;Dressing;Reach Overhead;Hygiene/Grooming   ? Examination-Participation Restrictions Driving;Cleaning;Meal Prep;Yard Work;Community Activity;Laundry;Occupation   ? Stability/Clinical Decision Making Evolving/Moderate complexity   ? Rehab Potential Good   ? PT Frequency 2x / week   ? PT Duration 8 weeks   ? PT Treatment/Interventions ADLs/Self Care Home Management;Cryotherapy;Electrical Stimulation;Iontophoresis 4mg /ml Dexamethasone;Moist Heat;Traction;Ultrasound;Functional mobility training;Therapeutic activities;Neuromuscular re-education;Therapeutic exercise;Patient/family education;Manual techniques;Passive range of motion;Dry needling;Splinting;Scar mobilization;Spinal Manipulations;Joint Manipulations   ? PT Next Visit Plan Continue to transitition toward A/ROM, continue focus on ROM work   ? PT Home Exercise Plan Access Code: [email or web address] (updated 4/11)   ? Consulted and Agree with Plan of Care Patient   ? ?  ?  ? ?  ? ? ?Patient will benefit from skilled therapeutic intervention in order to improve the following deficits and impairments:  Decreased activity tolerance, Decreased endurance, Decreased range of motion, Decreased strength, Hypomobility, Increased fascial restricitons, Impaired UE functional use, Pain, Decreased coordination, Decreased mobility, Postural dysfunction ? ?Visit  Diagnosis: ?Chronic right shoulder pain ? ? ? ? ?Problem List ?Patient Active Problem List  ? Diagnosis Date Noted  ? Elevated bilirubin 06/21/2021  ? ETOH abuse 06/21/2021  ? Complete tear of right rotator cuff 03/22/2021

## 2021-07-16 ENCOUNTER — Ambulatory Visit (INDEPENDENT_AMBULATORY_CARE_PROVIDER_SITE_OTHER): Payer: 59 | Admitting: Orthopaedic Surgery

## 2021-07-16 DIAGNOSIS — S46011A Strain of muscle(s) and tendon(s) of the rotator cuff of right shoulder, initial encounter: Secondary | ICD-10-CM

## 2021-07-16 NOTE — Progress Notes (Signed)
? ?                            ? ? ?Post Operative Evaluation ?  ? ?Procedure/Date of Surgery: Right shoulder rotator cuff repair done on April 23, 2021 ? ?Presents today for follow-up of his right shoulder.  He continues to make improvement with physical therapy.  Has been going twice weekly.  He has been out of work.  Overall he does believe that the continues to be less range of motion improving. ? ?PMH/PSH/Family History/Social History/Meds/Allergies:   ? ?Past Medical History:  ?Diagnosis Date  ? Allergy   ? Blood in stool   ? Chest pain   ? a. 11/2019 Cor CTA: Ca2 = 0. Nl cors.  ? CKD (chronic kidney disease) stage 2, GFR 60-89 ml/min   ? GERD (gastroesophageal reflux disease)   ? Heart murmur   ? as a child:  ? Hypertension   ? Pulmonary nodules   ? a. 11/2019 Cor CTA: Small L lung nodules - largest 25mm - incidental finding; b. 12/2020 Chest CT: Sm bilat pulm nodules - unchanged.  ? ?Past Surgical History:  ?Procedure Laterality Date  ? ANAL FISSURE REPAIR  05/1998  ? some type of anal fissure procedure  ? ARTHOSCOPIC ROTAOR CUFF REPAIR Right 04/23/2021  ? Procedure: Right ARTHROSCOPIC ROTATOR CUFF REPAIR;  Surgeon: Vanetta Mulders, MD;  Location: [facility];  Service: Orthopedics;  Laterality: Right;  ? COLONOSCOPY WITH PROPOFOL N/A 01/31/2020  ? Procedure: COLONOSCOPY WITH PROPOFOL;  Surgeon: Lin Landsman, MD;  Location: [facility] ENDOSCOPY;  Service: Gastroenterology;  Laterality: N/A;  ? ESOPHAGOGASTRODUODENOSCOPY (EGD) WITH PROPOFOL N/A 01/31/2020  ? Procedure: ESOPHAGOGASTRODUODENOSCOPY (EGD) WITH PROPOFOL;  Surgeon: Lin Landsman, MD;  Location: [facility] ENDOSCOPY;  Service: Gastroenterology;  Laterality: N/A;  ? ?Social History  ? ?Socioeconomic History  ? Marital status: Married  ?  Spouse name: Not on file  ? Number of children: Not on file  ? Years of education: Not on file  ? Highest education level: Some college, no degree  ?Occupational History  ? Occupation: trucker  ?Tobacco Use  ? Smoking  status: Never  ? Smokeless tobacco: Former  ?  Types: Snuff  ?  Quit date: 04/02/2021  ? Tobacco comments:  ?  dipping x 25-30 years  ?Vaping Use  ? Vaping Use: Never used  ?Substance and Sexual Activity  ? Alcohol use: Yes  ?  Comment: drinks 4-7 beers some days and none others. He drinks 24 12 oz can  beers per   ? Drug use: Never  ? Sexual activity: Yes  ?Other Topics Concern  ? Not on file  ?Social History Narrative  ? 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 ?Social Determinants of Health  ? ?Financial Resource Strain: Not on file  ?Food Insecurity: Not on file  ?Transportation Needs: Not on file  ?Physical Activity: Not on file  ?Stress: Not on file  ?Social Connections: Not on file  ? ?Family History  ?Problem Relation Age of Onset  ? Arthritis Mother   ? Asthma Mother   ? Hypertension Mother   ? Hearing loss Father   ? Alcohol abuse Sister   ? Cancer Sister   ? Depression Sister   ? Drug abuse Sister   ? Early death Sister   ? Heart attack Sister   ? Hypertension Sister   ? Miscarriages / Stillbirths Sister   ?  Asthma Sister   ? Depression Sister   ? Heart disease Maternal Uncle   ? Heart disease Maternal Uncle   ? Alcohol abuse Maternal Grandmother   ? Cancer Maternal Grandmother   ? Cancer Maternal Grandfather   ? Asthma Daughter   ? Asthma Son   ? ?No Known Allergies ?Current Outpatient Medications  ?Medication Sig Dispense Refill  ? acetaminophen (TYLENOL) 500 MG tablet Take 1,000 mg by mouth every 8 (eight) hours as needed for moderate pain.    ? amLODipine (NORVASC) 5 MG tablet Take 1 tablet (5 mg total) by mouth daily. 90 tablet 3  ? Fexofenadine-Pseudoephedrine (ALLEGRA-D PO) Take 1 tablet by mouth daily as needed (allergies).    ? omeprazole (PRILOSEC) 20 MG capsule Take 1 capsule (20 mg total) by mouth daily. (Patient taking differently: Take 20 mg by mouth every other day.) 30 capsule 3  ? triamcinolone (NASACORT) 55 MCG/ACT AERO nasal inhaler Place 2 sprays into the nose daily.  (Patient taking differently: Place 2 sprays into the nose daily as needed (allergies).) 1 each 4  ? ?No current facility-administered medications for this visit.  ? ?No results found. ? ?Review of Systems:   ?A ROS was performed including pertinent positives and negatives as documented in the HPI. ? ? ?Musculoskeletal Exam:   ? ?There were no vitals taken for this visit. ? ?Incisions are clean dry and intact.  No erythema or drainage.  Active forward elevation is to 130 degrees.  External rotation at the side is to 50 sensation intact all distributions including axillary distribution 2+ radial pulse.  Internal rotation is to L3 ? ?Imaging:   ? ?None ? ?I personally reviewed and interpreted the radiographs. ? ? ?Assessment:   ?61 year old male 12 weeks status post right shoulder rotator cuff repair.  Overall he is doing very well.  He continues to make improvements.  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  I will plan to keep him out of work for 1 additional month and we will perform a reassessment at that time. ?Plan :   ? ?-Return to clinic in 4 weeks, work note provided until that time ? ? ? ? ?I personally saw and evaluated the patient, and participated in the management and treatment plan. ? ?Vanetta Mulders, MD ?Attending Physician, Orthopedic Surgery ? ?This document was dictated using Systems analyst. A reasonable attempt at proof reading has been made to minimize errors. ?

## 2021-07-17 ENCOUNTER — Encounter: Payer: Self-pay | Admitting: Physical Therapy

## 2021-07-17 ENCOUNTER — Ambulatory Visit: Payer: 59 | Admitting: Physical Therapy

## 2021-07-17 DIAGNOSIS — M25511 Pain in right shoulder: Secondary | ICD-10-CM | POA: Diagnosis not present

## 2021-07-17 DIAGNOSIS — G8929 Other chronic pain: Secondary | ICD-10-CM

## 2021-07-17 NOTE — Therapy (Signed)
[facility] ?[address] ?[place], [place], [address] ?Phone: [phone]   Fax:  [phone] ? ?Physical Therapy Treatment ? ?Patient Details  ?Name: Craig Finley [facility] ?MRN: [id number] ?Date of Birth: September 17, 1960 ?Referring Provider (PT): Vanetta Mulders ? ? ?Encounter Date: 07/17/2021 ? ? PT End of Session - 07/17/21 1231   ? ? Visit Number 19   ? Number of Visits 41   ? Date for PT Re-Evaluation 09/14/21   ? Authorization Type [employer] Generic- VL of 30 per calendar year   ? Authorization Time Period 04/26/21-06/22/21, 3/31/223-09/14/21   ? Authorization - Visit Number 15   ? Authorization - Number of Visits 30   ? Progress Note Due on Visit 20   ? PT Start Time 1135   ? PT Stop Time [id number]   ? PT Time Calculation (min) 40 min   ? Activity Tolerance Patient tolerated treatment well;No increased pain   ? Behavior During Therapy [facility] for tasks assessed/performed   ? ?  ?  ? ?  ? ? ?Past Medical History:  ?Diagnosis Date  ? Allergy   ? Blood in stool   ? Chest pain   ? a. 11/2019 Cor CTA: Ca2 = 0. Nl cors.  ? CKD (chronic kidney disease) stage 2, GFR 60-89 ml/min   ? GERD (gastroesophageal reflux disease)   ? Heart murmur   ? as a child:  ? Hypertension   ? Pulmonary nodules   ? a. 11/2019 Cor CTA: Small L lung nodules - largest 54mm - incidental finding; b. 12/2020 Chest CT: Sm bilat pulm nodules - unchanged.  ? ? ?Past Surgical History:  ?Procedure Laterality Date  ? ANAL FISSURE REPAIR  05/1998  ? some type of anal fissure procedure  ? ARTHOSCOPIC ROTAOR CUFF REPAIR Right 04/23/2021  ? Procedure: Right ARTHROSCOPIC ROTATOR CUFF REPAIR;  Surgeon: Vanetta Mulders, MD;  Location: [facility];  Service: Orthopedics;  Laterality: Right;  ? COLONOSCOPY WITH PROPOFOL N/A 01/31/2020  ? Procedure: COLONOSCOPY WITH PROPOFOL;  Surgeon: Lin Landsman, MD;  Location: [facility] ENDOSCOPY;  Service: Gastroenterology;  Laterality: N/A;  ? ESOPHAGOGASTRODUODENOSCOPY (EGD) WITH  PROPOFOL N/A 01/31/2020  ? Procedure: ESOPHAGOGASTRODUODENOSCOPY (EGD) WITH PROPOFOL;  Surgeon: Lin Landsman, MD;  Location: [facility] ENDOSCOPY;  Service: Gastroenterology;  Laterality: N/A;  ? ? ?There were no vitals filed for this visit. ? ? Subjective Assessment - 07/17/21 1138   ? ? Subjective Pt is compliant with HEP. Had follow up with his surgeon last week who recommended he continue strengthening with PT and hold on return to work for another 4 weeks. He has another follow up with surgeon in another 4 weeks.   ? Currently in Pain? No/denies   ? ?  ?  ? ?  ? ? ? ?  ?INTERVENTIONS: ?  ?-shoulder flexion on pulleys 2x60 seconds ?-shoulder abduction on pulleys 2x60 seconds  ?-supine PROM to available end range GH flexion, scaption, abduction and ER x5 minutes ?-inferior and posterior glides, grade 4 at end abduction 3x30 seconds ?  ?All therapeutic exercises performed in standing with RUE: ?- GH ER using YTB, 3x10 ?- GH IR using YTB, 3x10 ? ?- SA row RTB, 3x10 ?- GH extension YTB, 3x10 ? ?-Shoulder flexion 2# DB 3x10 ?-Shoulder ABD 2# DB 3x10 ?-Full Can in Scapular Plane through 120 degrees, 2# DB 3x10 ? ?-Elbow Extension 3# DB 3x10 ?-Bicep curl 7# DB 3x10 ?  ?  ?HEP updated to include  resistance bands and strengthening in standing.  ?  ? ?Access Code: [email or web address] ? ?Exercises ?- Seated Shoulder Flexion AAROM with Pulley Behind  - 7 x weekly - 3 sets - 1 minute time ?- Seated Shoulder Abduction Towel Slide at Table Top  - 3 x weekly - 3 sets - 10 reps ?- Standing Shoulder Flexion with Dumbbells  - 1 x daily - 3 x weekly - 3 sets - 10 reps ?- Standing Single Arm Shoulder Abduction with Dumbbell - Thumb Up  - 1 x daily - 3 x weekly - 3 sets - 10 reps ?- Single Arm Scaption with Dumbbell  - 1 x daily - 3 x weekly - 3 sets - 10 reps ?- Standing Shoulder Internal Rotation with Anchored Resistance  - 1 x daily - 3 x weekly - 3 sets - 10 reps ?- Shoulder External Rotation with Anchored Resistance  - 1 x daily - 3 x  weekly - 3 sets - 10 reps ?- Standing Single Arm Row with Resistance Thumb Up  - 1 x daily - 3 x weekly - 3 sets - 10 reps ?- Single Arm Shoulder Extension with Anchored Resistance  - 1 x daily - 3 x weekly - 3 sets - 10 reps ?- Standing Triceps Extension with Dumbbells  - 1 x daily - 3 x weekly - 3 sets - 10 reps ?- Standing Bicep Curls Supinated with Dumbbells  - 1 x daily - 3 x weekly - 3 sets - 10 reps ? ?  ?  ?Clinical Impression: Pt is pleasant and motivated throughout session. He will now be seen 1x/week in order to extend insurance covered visits. Progressed POC to using yellow and red resistance bands and progressed dumbbells from 1# to 2#. He tolerated progressions well with muscular fatigue and no pain. PT instructed pt to perform HEP every other day. 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  ? ? ? ? ? ? ? ? ? PT Short Term Goals - 06/12/21 1641   ? ?  ? PT SHORT TERM GOAL #1  ? Title Pt will demonstrate independence with HEP to improve R shoulder function for increased ability to participate with ADLs.   ? Baseline 04/26/21 HEP given   ? Time 4   ? Period Weeks   ? Status Achieved   ? Target Date 05/24/21   ? ?  ?  ? ?  ? ? ? ? PT Long Term Goals - 06/12/21 1642   ? ?  ? PT LONG TERM GOAL #1  ? Title Pt will increase R shoulder abduction and flexion ROM to >170 degrees in order to complete functional ADL   ? Baseline 04/26/21 unable to assess; 06/12/21: 85-95 degrees   ? Time 8   ? Period Weeks   ? Status On-going   ? Target Date 06/21/21   ?  ? PT LONG TERM GOAL #2  ? Title Pt will be able to complete Apley scratch test on Rt shoulder and touch inferior border of left scapula to show clinically significant increase in IR to shower independently.   ? Baseline 04/26/21 unable to access; 3/21: ROM stil very limited, max effort to bring Rt thumb to central spine.   ? Time 8   ? Period Weeks   ? Status  On-going   ? Target Date 06/21/21   ?  ?  PT LONG TERM GOAL #3  ? Title Pt will increase R grip dynomometer score by to 85# to demonstrate PLOF strength evidenced by uninvolved side needed for heavy household tasks.   ? Baseline 04/26/21 R: 25lbs   ? Time 8   ? Period Weeks   ? Status On-going   ? Target Date 06/21/21   ?  ? PT LONG TERM GOAL #4  ? Title Pt will demonstrate gross R shoulder and elbow strength of 5/5 in order to demonstrate PLOF strength for heavy household ADLs and return to sport   ? Baseline 04/26/21 no test performed   ? Time 8   ? Period Weeks   ? Status On-going   ? Target Date 03/20/21   ? ?  ?  ? ?  ? ? ? ? ? ? ? ? Plan - 07/17/21 1231   ? ? Clinical Impression Statement Pt is pleasant and motivated throughout session. He will now be seen 1x/week in order to extend insurance covered visits. Progressed POC to using yellow and red resistance bands and progressed dumbbells from 1# to 2#. He tolerated progressions well with muscular fatigue and no pain. PT instructed pt to perform HEP every other day. 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   ? Personal Factors and Comorbidities Past/Current Experience;Age   ? Examination-Activity Limitations Carry;Lift;Bed Mobility;Sleep;Dressing;Reach Overhead;Hygiene/Grooming   ? Examination-Participation Restrictions Driving;Cleaning;Meal Prep;Yard Work;Community Activity;Laundry;Occupation   ? Stability/Clinical Decision Making Evolving/Moderate complexity   ? Rehab Potential Good   ? PT Frequency 2x / week   ? PT Duration 8 weeks   ? PT Treatment/Interventions ADLs/Self Care Home Management;Cryotherapy;Electrical Stimulation;Iontophoresis 4mg /ml Dexamethasone;Moist Heat;Traction;Ultrasound;Functional mobility training;Therapeutic activities;Neuromuscular re-education;Therapeutic exercise;Patient/family education;Manual techniques;Passive range of  motion;Dry needling;Splinting;Scar mobilization;Spinal Manipulations;Joint Manipulations   ? PT Next Visit Plan Continue to transitition toward A/ROM, continue focus on ROM work   ? PT Home Exercise Plan Access Code:

## 2021-07-18 ENCOUNTER — Telehealth: Payer: Self-pay | Admitting: Orthopaedic Surgery

## 2021-07-18 NOTE — Telephone Encounter (Signed)
Pt submitted continuous disability forms for [employer]. 07/18/21 ?

## 2021-07-19 ENCOUNTER — Encounter: Payer: 59 | Admitting: Physical Therapy

## 2021-07-24 ENCOUNTER — Encounter: Payer: Self-pay | Admitting: Physical Therapy

## 2021-07-24 ENCOUNTER — Telehealth: Payer: Self-pay | Admitting: Physical Therapy

## 2021-07-24 ENCOUNTER — Ambulatory Visit: Payer: 59 | Attending: Internal Medicine | Admitting: Physical Therapy

## 2021-07-24 DIAGNOSIS — M25511 Pain in right shoulder: Secondary | ICD-10-CM | POA: Insufficient documentation

## 2021-07-24 DIAGNOSIS — G8929 Other chronic pain: Secondary | ICD-10-CM | POA: Insufficient documentation

## 2021-07-24 NOTE — Therapy (Signed)
[facility] ?[address] ?[place], [place], [address] ?Phone: [phone]   Fax:  [phone] ? ?Physical Therapy Treatment/Physical Therapy Progress Note ? ? ?Dates of reporting period  06/12/21   to   07/24/21 ? ? ?Patient Details  ?Name: Craig Finley [facility] ?MRN: [id number] ?Date of Birth: Sep 20, 1960 ?Referring Provider (PT): Huel Cote ? ? ?Encounter Date: 07/24/2021 ? ? PT End of Session - 07/24/21 1354   ? ? Visit Number 20   ? Number of Visits 41   ? Date for PT Re-Evaluation 09/14/21   ? Authorization Type [employer] Generic- VL of 30 per calendar year   ? Authorization Time Period 04/26/21-06/22/21, 3/31/223-09/14/21   ? Authorization - Visit Number 15   ? Authorization - Number of Visits 30   ? Progress Note Due on Visit 20   ? PT Start Time 1307   ? PT Stop Time 1348   ? PT Time Calculation (min) 41 min   ? Activity Tolerance Patient tolerated treatment well;No increased pain   ? Behavior During Therapy [facility] for tasks assessed/performed   ? ?  ?  ? ?  ? ? ?Past Medical History:  ?Diagnosis Date  ? Allergy   ? Blood in stool   ? Chest pain   ? a. 11/2019 Cor CTA: Ca2 = 0. Nl cors.  ? CKD (chronic kidney disease) stage 2, GFR 60-89 ml/min   ? GERD (gastroesophageal reflux disease)   ? Heart murmur   ? as a child:  ? Hypertension   ? Pulmonary nodules   ? a. 11/2019 Cor CTA: Small L lung nodules - largest 29mm - incidental finding; b. 12/2020 Chest CT: Sm bilat pulm nodules - unchanged.  ? ? ?Past Surgical History:  ?Procedure Laterality Date  ? ANAL FISSURE REPAIR  05/1998  ? some type of anal fissure procedure  ? ARTHOSCOPIC ROTAOR CUFF REPAIR Right 04/23/2021  ? Procedure: Right ARTHROSCOPIC ROTATOR CUFF REPAIR;  Surgeon: Huel Cote, MD;  Location: [facility] OR;  Service: Orthopedics;  Laterality: Right;  ? COLONOSCOPY WITH PROPOFOL N/A 01/31/2020  ? Procedure: COLONOSCOPY WITH PROPOFOL;  Surgeon: Toney Reil, MD;  Location: [facility] ENDOSCOPY;   Service: Gastroenterology;  Laterality: N/A;  ? ESOPHAGOGASTRODUODENOSCOPY (EGD) WITH PROPOFOL N/A 01/31/2020  ? Procedure: ESOPHAGOGASTRODUODENOSCOPY (EGD) WITH PROPOFOL;  Surgeon: Toney Reil, MD;  Location: [facility] ENDOSCOPY;  Service: Gastroenterology;  Laterality: N/A;  ? ? ?There were no vitals filed for this visit. ? ? Subjective Assessment - 07/24/21 1308   ? ? Subjective Pt is compliant with HEP. Denies pain. No questions/concerns.   ? Currently in Pain? No/denies   ? ?  ?  ? ?  ? ? ? ? ? ? ?PROGRESS NOTE ? ? ?OUTCOME MEASURES / GOALS ?LUE grip 100# ?RUE grip 75# ? ?AROM GH abduction 141d ?AROM GH flexion 149d ? ?MMT:  ?LUE 5/5 globally  ?RUE ?   Flexion: 4/5 ?   Abduction: 4/5 ?   ER: 4/5 ?   IR: 5/5  ?   Bicep 4+/5 ?   Tricep 4+/5  ? ?Apley scratch test: limited in all ranges.  ?Pt is able to reach to anterior shoulder (horizontal adduction), T1 (ER), L2 (IR) ? ? ? ? ?INTERVENTIONS: ?  ?-shoulder flexion on pulleys 2x60 seconds ?-shoulder abduction on pulleys 2x60 seconds  ?-supine PROM to available end range GH flexion, scaption, abduction and ER x2 minutes ?-posterior glides, grade 4 at end abduction 3x30  seconds ?  ?Performed in standing with RUE: ?- GH ER using GreenTB, 3x10 ?  ?- SA row BlueTB, 3x10 ?- GH extension BlueTB, 3x10 ?  ?-prone I, T, Y, W using RUE 1x10 each (performed for understanding of HEP) ? ?-OMEGA chest press, 10# 3x10 ? -VC to push with right and only guide with left  ? ?-OMEGA cable SA row, 10# 3x10 ? ?  ?Updated resistance used in HEP and provided scapular strengthening. ?HEP Access Code: [email or web address] ?HEP (scap strengthening): [id number] ?  ? ?  ?  ?Clinical Impression: Pt is pleasant throughout session. Patient demonstrates excellent progress towards ROM and strength goals. He continues to be very motivated at home with HEP and frequent performance on pulleys and it shows in today's assessment. Pt asked about performing HEP every day - PT encouraged every other day and provided  an additional HEP for scapular strengthening that pt can perform on his "off days." Provided progressions in resistance for HEP. Added chest press and cable row to today's interventions with good tolerance. 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  ? ?Patient's condition has the potential to improve in response to therapy. Maximum improvement is yet to be obtained. The anticipated improvement is attainable and reasonable in a generally predictable time.   ? ? ? ? ? ? ? ? ? ? ? ? PT Short Term Goals - 06/12/21 1641   ? ?  ? PT SHORT TERM GOAL #1  ? Title Pt will demonstrate independence with HEP to improve R shoulder function for increased ability to participate with ADLs.   ? Baseline 04/26/21 HEP given   ? Time 4   ? Period Weeks   ? Status Achieved   ? Target Date 05/24/21   ? ?  ?  ? ?  ? ? ? ? PT Long Term Goals - 07/24/21 1402   ? ?  ? PT LONG TERM GOAL #1  ? Title Pt will increase R shoulder abduction and flexion ROM to >170 degrees in order to complete functional ADL   ? Baseline 04/26/21 unable to assess; 06/12/21: 85-95 degrees; 07/24/21: F 149, ABD 141 (AROM)   ? Time 8   ? Period Weeks   ? Status On-going   ? Target Date 09/14/21   ?  ? PT LONG TERM GOAL #2  ? Title Pt will be able to complete Apley scratch test on Rt shoulder and touch inferior border of left scapula to show clinically significant increase in IR to shower independently.   ? Baseline 04/26/21 unable to access; 3/21: ROM stil very limited, max effort to bring Rt thumb to central spine.; 07/24/21: limited - able to reach to anterior shoulder (horizontal adduction), T1 (ER), L2 (IR).   ? Time 8   ? Period Weeks   ? Status On-going   ? Target Date 09/14/21   ?  ? PT LONG TERM GOAL #3  ? Title Pt will increase R grip dynomometer score by to 85# to demonstrate PLOF strength evidenced by uninvolved side needed for heavy household tasks.   ?  Baseline 04/26/21 R: 25lbs; 07/24/21: 75lbs   ? Time 8   ? Period Weeks   ? Status On-going   ? Target Date 09/14/21   ?  ? PT LONG TERM GOAL #4  ? Title Pt will demonstrate gross R shoulder and elbow strength of 5/5  in order to demonstrate PLOF strength for heavy household ADLs and return to sport   ? Baseline 04/26/21 no test performed; 07/24/21: generally 4/5 shoulder and 4+/5 elbow   ? Time 8   ? Period Weeks   ? Status On-going   ? Target Date 09/14/21   ? ?  ?  ? ?  ? ? ? ? ? ? ? ? Plan - 07/24/21 1411   ? ? Clinical Impression Statement Pt is pleasant throughout session. Patient demonstrates excellent progress towards ROM and strength goals. He continues to be very motivated at home with HEP and frequent performance on pulleys and it shows in today's assessment. Pt asked about performing HEP every day - PT encouraged every other day and provided an additional HEP for scapular strengthening that pt can perform on his "off days." Provided progressions in resistance for HEP. Added chest press and cable row to today's interventions with good tolerance. 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   ? Personal Factors and Comorbidities Past/Current Experience;Age   ? Examination-Activity Limitations Carry;Lift;Bed Mobility;Sleep;Dressing;Reach Overhead;Hygiene/Grooming   ? Examination-Participation Restrictions Driving;Cleaning;Meal Prep;Yard Work;Community Activity;Laundry;Occupation   ? Stability/Clinical Decision Making Evolving/Moderate complexity   ? Rehab Potential Good   ? PT Frequency 2x / week   ? PT Duration 8 weeks   ? PT Treatment/Interventions ADLs/Self Care Home Management;Cryotherapy;Electrical Stimulation;Iontophoresis 4mg /ml Dexamethasone;Moist Heat;Traction;Ultrasound;Functional mobility training;Therapeutic activities;Neuromuscular re-education;Therapeutic exercise;Patient/family education;Manual  techniques;Passive range of motion;Dry needling;Splinting;Scar mobilization;Spinal Manipulations;Joint Manipulations   ? PT Next Visit Plan ROM, strength, update HEP as needed   ? [place]

## 2021-07-24 NOTE — Telephone Encounter (Signed)
Called pt this morning after he did not arrive for 8:30am appointment. Pt had changed appointment time however this change did not make it into the scheduling system. The 11:30 time slot that he had changed to later this date is no longer available. Administration to contact pt and reschedule. PT apologized for mistake on behalf of the clinic.  ? ?Basilia Jumbo PT, DPT ? ?

## 2021-07-25 NOTE — Telephone Encounter (Signed)
Left detailed msg on VM per HIPAA  

## 2021-07-26 ENCOUNTER — Encounter: Payer: 59 | Admitting: Physical Therapy

## 2021-07-26 ENCOUNTER — Ambulatory Visit
Admission: RE | Admit: 2021-07-26 | Discharge: 2021-07-26 | Disposition: A | Discharge status: Home / Self Care | Payer: 59 | Source: Ambulatory Visit | Attending: Gastroenterology | Admitting: Gastroenterology

## 2021-07-26 DIAGNOSIS — K769 Liver disease, unspecified: Secondary | ICD-10-CM | POA: Diagnosis present

## 2021-07-26 DIAGNOSIS — R932 Abnormal findings on diagnostic imaging of liver and biliary tract: Secondary | ICD-10-CM | POA: Insufficient documentation

## 2021-07-26 MED ORDER — GADOBUTROL 1 MMOL/ML IV SOLN
9.0000 mL | Freq: Once | INTRAVENOUS | Status: AC | PRN
  Administered 2021-07-26: 9 mL via INTRAVENOUS

## 2021-07-31 ENCOUNTER — Encounter: Payer: 59 | Admitting: Physical Therapy

## 2021-08-02 ENCOUNTER — Ambulatory Visit: Payer: 59 | Admitting: Physical Therapy

## 2021-08-02 ENCOUNTER — Encounter: Payer: Self-pay | Admitting: Internal Medicine

## 2021-08-02 ENCOUNTER — Ambulatory Visit: Payer: 59 | Admitting: Internal Medicine

## 2021-08-02 ENCOUNTER — Encounter: Payer: Self-pay | Admitting: Physical Therapy

## 2021-08-02 ENCOUNTER — Encounter: Payer: 59 | Admitting: Physical Therapy

## 2021-08-02 VITALS — BP 135/82 | HR 62 | Temp 98.7°F | Ht 66.73 in | Wt 210.0 lb

## 2021-08-02 DIAGNOSIS — R062 Wheezing: Secondary | ICD-10-CM | POA: Insufficient documentation

## 2021-08-02 DIAGNOSIS — R7989 Other specified abnormal findings of blood chemistry: Secondary | ICD-10-CM | POA: Diagnosis not present

## 2021-08-02 DIAGNOSIS — K219 Gastro-esophageal reflux disease without esophagitis: Secondary | ICD-10-CM | POA: Insufficient documentation

## 2021-08-02 DIAGNOSIS — G8929 Other chronic pain: Secondary | ICD-10-CM

## 2021-08-02 DIAGNOSIS — R17 Unspecified jaundice: Secondary | ICD-10-CM | POA: Diagnosis not present

## 2021-08-02 LAB — URINALYSIS, ROUTINE W REFLEX MICROSCOPIC
Bilirubin, UA: NEGATIVE
Glucose, UA: NEGATIVE
Ketones, UA: NEGATIVE
Leukocytes,UA: NEGATIVE
Nitrite, UA: NEGATIVE
Protein,UA: NEGATIVE
RBC, UA: NEGATIVE
Specific Gravity, UA: 1.015 (ref 1.005–1.030)
Urobilinogen, Ur: 0.2 mg/dL (ref 0.2–1.0)
pH, UA: 5.5 (ref 5.0–7.5)

## 2021-08-02 MED ORDER — ALBUTEROL SULFATE HFA 108 (90 BASE) MCG/ACT IN AERS: 2.0000 | INHALATION_SPRAY | Freq: Four times a day (QID) | RESPIRATORY_TRACT | 2 refills | Status: DC | PRN

## 2021-08-02 MED ORDER — FEXOFENADINE HCL 180 MG PO TABS: 180.0000 mg | ORAL_TABLET | Freq: Every day | ORAL | 1 refills | Status: DC

## 2021-08-02 MED ORDER — OMEPRAZOLE 20 MG PO CPDR: 20.0000 mg | DELAYED_RELEASE_CAPSULE | Freq: Every day | ORAL | 3 refills | Status: DC

## 2021-08-02 NOTE — Therapy (Signed)
[place] [facility] ?[address] ?[place], [place], [address] ?Phone: [phone]   Fax:  [phone] ? ?Physical Therapy Treatment ? ?Patient Details  ?Name: Craig Finley [facility] ?MRN: [id number] ?Date of Birth: 25-Sep-1960 ?Referring Provider (PT): Vanetta Mulders ? ? ?Encounter Date: 08/02/2021 ? ? PT End of Session - 08/02/21 1059   ? ? Visit Number 21   ? Number of Visits 41   ? Date for PT Re-Evaluation 09/14/21   ? Authorization Type [employer]- VL of 30 per calendar year   ? Authorization Time Period 04/26/21-06/22/21, 3/31/223-09/14/21   ? Authorization - Visit Number 21   ? Authorization - Number of Visits 30   ? Progress Note Due on Visit 30   ? PT Start Time 1051   ? PT Stop Time 1130   ? PT Time Calculation (min) 39 min   ? Activity Tolerance Patient tolerated treatment well;No increased pain   ? Behavior During Therapy [facility] for tasks assessed/performed   ? ?  ?  ? ?  ? ? ?Past Medical History:  ?Diagnosis Date  ? Allergy   ? Blood in stool   ? Chest pain   ? a. 11/2019 Cor CTA: Ca2 = 0. Nl cors.  ? CKD (chronic kidney disease) stage 2, GFR 60-89 ml/min   ? GERD (gastroesophageal reflux disease)   ? Heart murmur   ? as a child:  ? Hypertension   ? Pulmonary nodules   ? a. 11/2019 Cor CTA: Small L lung nodules - largest 50mm - incidental finding; b. 12/2020 Chest CT: Sm bilat pulm nodules - unchanged.  ? ? ?Past Surgical History:  ?Procedure Laterality Date  ? ANAL FISSURE REPAIR  05/1998  ? some type of anal fissure procedure  ? ARTHOSCOPIC ROTAOR CUFF REPAIR Right 04/23/2021  ? Procedure: Right ARTHROSCOPIC ROTATOR CUFF REPAIR;  Surgeon: Vanetta Mulders, MD;  Location: [facility];  Service: Orthopedics;  Laterality: Right;  ? COLONOSCOPY WITH PROPOFOL N/A 01/31/2020  ? Procedure: COLONOSCOPY WITH PROPOFOL;  Surgeon: Lin Landsman, MD;  Location: [facility] ENDOSCOPY;  Service: Gastroenterology;  Laterality: N/A;  ? ESOPHAGOGASTRODUODENOSCOPY (EGD) WITH  PROPOFOL N/A 01/31/2020  ? Procedure: ESOPHAGOGASTRODUODENOSCOPY (EGD) WITH PROPOFOL;  Surgeon: Lin Landsman, MD;  Location: [facility] ENDOSCOPY;  Service: Gastroenterology;  Laterality: N/A;  ? ? ?There were no vitals filed for this visit. ? ? Subjective Assessment - 08/02/21 1057   ? ? Subjective Patient reports tension pain in shoulder today, 2/10 pain. Completing HEP   ? Pertinent History Tavoris Hucks is a 59yoM who is referred to OPPT for rehabilitzation s/p Rt shoulder arthroscopic supraspinatus repair.   ? Limitations Lifting;House hold activities   ? How long can you sit comfortably? unlimited   ? How long can you stand comfortably? unlimited   ? How long can you walk comfortably? unlimited   ? Diagnostic tests xray, unremarkable aside from age-typical arthrosis   ? Patient Stated Goals pnt would like to sleep comfortably and return to softball   ? Pain Onset 1 to 4 weeks ago   ? ?  ?  ? ?  ? ? ? ?Therex: ?TRX flex and abd walkouts x12 each with good AAROM noted ?- Seated shoulder flex 1# 2x 12 with decent carry over of scapulohumeral rhythm with TC of wall behind back min cuing needed for eccentric control  ?- Seated shoulder abd 1# 2x 12 with decent carry over of scapulohumeral rhythm with TC of wall  behind back  ?- R ER BluTB 2x 10 with cuing for eccentric control, good carry over ?- RUE row in standing 10# x12; 12# x12  with eccentric focus with good carry over ?- abd RUE row 5# 2x 12 with good carry over of posture ? ? ? ? ? ? ? ? ? ? ? ? ? ? ? ? ? ? ? ? ? ? ? ? ? PT Education - 08/02/21 1058   ? ? Education Details therex form/technique   ? Person(s) Educated Patient   ? Methods Explanation;Demonstration;Verbal cues   ? Comprehension Verbalized understanding;Returned demonstration;Verbal cues required   ? ?  ?  ? ?  ? ? ? PT Short Term Goals - 06/12/21 1641   ? ?  ? PT SHORT TERM GOAL #1  ? Title Pt will demonstrate independence with HEP to improve R shoulder function for increased ability to  participate with ADLs.   ? Baseline 04/26/21 HEP given   ? Time 4   ? Period Weeks   ? Status Achieved   ? Target Date 05/24/21   ? ?  ?  ? ?  ? ? ? ? PT Long Term Goals - 07/24/21 1402   ? ?  ? PT LONG TERM GOAL #1  ? Title Pt will increase R shoulder abduction and flexion ROM to >170 degrees in order to complete functional ADL   ? Baseline 04/26/21 unable to assess; 06/12/21: 85-95 degrees; 07/24/21: F 149, ABD 141 (AROM)   ? Time 8   ? Period Weeks   ? Status On-going   ? Target Date 09/14/21   ?  ? PT LONG TERM GOAL #2  ? Title Pt will be able to complete Apley scratch test on Rt shoulder and touch inferior border of left scapula to show clinically significant increase in IR to shower independently.   ? Baseline 04/26/21 unable to access; 3/21: ROM stil very limited, max effort to bring Rt thumb to central spine.; 07/24/21: limited - able to reach to anterior shoulder (horizontal adduction), T1 (ER), L2 (IR).   ? Time 8   ? Period Weeks   ? Status On-going   ? Target Date 09/14/21   ?  ? PT LONG TERM GOAL #3  ? Title Pt will increase R grip dynomometer score by to 85# to demonstrate PLOF strength evidenced by uninvolved side needed for heavy household tasks.   ? Baseline 04/26/21 R: 25lbs; 07/24/21: 75lbs   ? Time 8   ? Period Weeks   ? Status On-going   ? Target Date 09/14/21   ?  ? PT LONG TERM GOAL #4  ? Title Pt will demonstrate gross R shoulder and elbow strength of 5/5 in order to demonstrate PLOF strength for heavy household ADLs and return to sport   ? Baseline 04/26/21 no test performed; 07/24/21: generally 4/5 shoulder and 4+/5 elbow   ? Time 8   ? Period Weeks   ? Status On-going   ? Target Date 09/14/21   ? ?  ?  ? ?  ? ? ? ? ? ? ? ? Plan - 08/02/21 1117   ? ? Clinical Impression Statement PT continued therex progression for increased mobility, strength and scapulohumeral rhythm with success. Patient is able to demonstrate decent carry over of cuing for scapulohumeral rhythm, with a lot of scpaular elevation for  overhead AROM. PT reviewed HEP and educated patient on the importance of eccentric control with good understanding. Patient is  able to complete therex with good motivaitonthroughout session, wit no increased pain. PT will continue progression as able.   ? Personal Factors and Comorbidities Past/Current Experience;Age   ? Examination-Activity Limitations Carry;Lift;Bed Mobility;Sleep;Dressing;Reach Overhead;Hygiene/Grooming   ? Examination-Participation Restrictions Driving;Cleaning;Meal Prep;Yard Work;Community Activity;Laundry;Occupation   ? Stability/Clinical Decision Making Evolving/Moderate complexity   ? Clinical Decision Making Moderate   ? Rehab Potential Good   ? PT Frequency 2x / week   ? PT Treatment/Interventions ADLs/Self Care Home Management;Cryotherapy;Electrical Stimulation;Iontophoresis 4mg /ml Dexamethasone;Moist Heat;Traction;Ultrasound;Functional mobility training;Therapeutic activities;Neuromuscular re-education;Therapeutic exercise;Patient/family education;Manual techniques;Passive range of motion;Dry needling;Splinting;Scar mobilization;Spinal Manipulations;Joint Manipulations   ? PT Next Visit Plan ROM, strength, update HEP as needed   ? PT Home Exercise Plan Access Code: [email or web address] (updated 4/11); Access Code: [email or web address] (07/17/21); Access Code: [email or web address] (2nd HEP for "off days")   ? Consulted and Agree with Plan of Care Patient   ? ?  ?  ? ?  ? ? ?Patient will benefit from skilled therapeutic intervention in order to improve the following deficits and impairments:  Decreased activity tolerance, Decreased endurance, Decreased range of motion, Decreased strength, Hypomobility, Increased fascial restricitons, Impaired UE functional use, Pain, Decreased coordination, Decreased mobility, Postural dysfunction ? ?Visit Diagnosis: ?Chronic right shoulder pain ? ? ? ? ?Problem List ?Patient Active Problem List  ? Diagnosis Date Noted  ? Elevated bilirubin 06/21/2021  ? ETOH abuse 06/21/2021  ? Complete tear  of right rotator cuff 03/22/2021  ? Right shoulder pain 01/30/2021  ? Primary hypertension 01/30/2021  ? Non-seasonal allergic rhinitis 02/14/2020  ? Chest pain, non-cardiac 01/07/2020  ? Pulmonary nodules 01/07/2020

## 2021-08-02 NOTE — Progress Notes (Signed)
? ?BP 135/82   Pulse 62   Temp 98.7 ?F (37.1 ?C) (Oral)   Ht 5' 6.73" (1.695 m)   Wt 210 lb (95.3 kg)   SpO2 98%   BMI 33.16 kg/m?   ? ?Subjective:  ? ? Patient ID: Craig Finley, male    DOB: 07/28/1960, 61 y.o.   MRN: [id number] ? ?Chief Complaint  ?Patient presents with  ?? elevated bilirubin  ? ? ?HPI: ?Craig Finley is a 61 y.o. male ? ?URI  ?This is a new problem. The current episode started 1 to 4 weeks ago. The problem has been waxing and waning. There has been no fever. Associated symptoms include congestion, coughing, rhinorrhea and wheezing. Pertinent negatives include no abdominal pain, chest pain, diarrhea, dysuria, ear pain, headaches, nausea, neck pain, rash, sore throat, swollen glands or vomiting.  ?Gastroesophageal Reflux ?He complains of coughing, heartburn and wheezing. He reports no abdominal pain, no belching, no chest pain, no choking, no dysphagia, no early satiety, no globus sensation, no hoarse voice, no nausea, no sore throat, no stridor, no tooth decay or no water brash. Pertinent negatives include no fatigue.  ?Wheezing  ?This is a chronic problem. The current episode started in the past 7 days. Associated symptoms include coughing and rhinorrhea. Pertinent negatives include no abdominal pain, chest pain, chills, coryza, diarrhea, ear pain, fever, headaches, hemoptysis, neck pain, rash, shortness of breath, sore throat, sputum production, swollen glands or vomiting. His past medical history is significant for asthma. There is no history of COPD.  ? ?Chief Complaint  ?Patient presents with  ?? elevated bilirubin  ? ? ?Relevant past medical, surgical, family and social history reviewed and updated as indicated. Interim medical history since our last visit reviewed. ?Allergies and medications reviewed and updated. ? ?Review of Systems  ?Constitutional:  Negative for activity change, appetite change, chills, fatigue and fever.  ?HENT:  Positive for congestion and  rhinorrhea. Negative for ear discharge, ear pain, facial swelling, hoarse voice and sore throat.   ?Eyes:  Negative for pain, discharge and itching.  ?Respiratory:  Positive for cough and wheezing. Negative for hemoptysis, sputum production, choking, chest tightness and shortness of breath.   ?Cardiovascular:  Negative for chest pain, palpitations and leg swelling.  ?Gastrointestinal:  Positive for heartburn. Negative for abdominal distention, abdominal pain, blood in stool, constipation, diarrhea, dysphagia, nausea and vomiting.  ?Endocrine: Negative for cold intolerance, heat intolerance, polydipsia, polyphagia and polyuria.  ?Genitourinary:  Negative for difficulty urinating, dysuria, flank pain, frequency, hematuria and urgency.  ?Musculoskeletal:  Negative for arthralgias, gait problem, joint swelling, myalgias and neck pain.  ?Skin:  Negative for color change, rash and wound.  ?Neurological:  Negative for dizziness, tremors, speech difficulty, weakness, light-headedness, numbness and headaches.  ?Hematological:  Does not bruise/bleed easily.  ?Psychiatric/Behavioral:  Negative for agitation, confusion, decreased concentration, sleep disturbance and suicidal ideas.   ? ?Per HPI unless specifically indicated above ? ?   ?Objective:  ?  ?BP 135/82   Pulse 62   Temp 98.7 ?F (37.1 ?C) (Oral)   Ht 5' 6.73" (1.695 m)   Wt 210 lb (95.3 kg)   SpO2 98%   BMI 33.16 kg/m?   ?Wt Readings from Last 3 Encounters:  ?08/02/21 210 lb (95.3 kg)  ?07/12/21 209 lb 9.6 oz (95.1 kg)  ?07/11/21 211 lb (95.7 kg)  ?  ?Physical Exam ?Vitals and nursing note reviewed.  ?Constitutional:   ?   General: He is not in acute distress. ?  Appearance: Normal appearance. He is not ill-appearing or diaphoretic.  ?HENT:  ?   Head: Normocephalic and atraumatic.  ?   Right Ear: Tympanic membrane and external ear normal. There is no impacted cerumen.  ?   Left Ear: External ear normal.  ?   Nose: No congestion or rhinorrhea.  ?   Mouth/Throat:   ?   Pharynx: No oropharyngeal exudate or posterior oropharyngeal erythema.  ?Eyes:  ?   Conjunctiva/sclera: Conjunctivae normal.  ?   Pupils: Pupils are equal, round, and reactive to light.  ?Cardiovascular:  ?   Rate and Rhythm: Normal rate and regular rhythm.  ?   Heart sounds: No murmur heard. ?  No friction rub. No gallop.  ?Pulmonary:  ?   Effort: No respiratory distress.  ?   Breath sounds: No stridor. No wheezing or rhonchi.  ?Chest:  ?   Chest wall: No tenderness.  ?Abdominal:  ?   General: Abdomen is flat. Bowel sounds are normal.  ?   Palpations: Abdomen is soft. There is no mass.  ?   Tenderness: There is no abdominal tenderness.  ?Musculoskeletal:  ?   Cervical back: Normal range of motion and neck supple. No rigidity or tenderness.  ?   Left lower leg: No edema.  ?Neurological:  ?   Mental Status: He is alert.  ? ? ?Results for orders placed or performed in visit on 06/21/21  ?Comprehensive metabolic panel  ?Result Value Ref Range  ? Glucose 88 70 - 99 mg/dL  ? BUN 15 8 - 27 mg/dL  ? Creatinine, Ser 1.24 0.76 - 1.27 mg/dL  ? eGFR 67 >59 mL/min/1.73  ? BUN/Creatinine Ratio 12 10 - 24  ? Sodium 141 134 - 144 mmol/L  ? Potassium 4.0 3.5 - 5.2 mmol/L  ? Chloride 102 96 - 106 mmol/L  ? CO2 24 20 - 29 mmol/L  ? Calcium 9.6 8.6 - 10.2 mg/dL  ? Total Protein 7.2 6.0 - 8.5 g/dL  ? Albumin 4.6 3.8 - 4.9 g/dL  ? Globulin, Total 2.6 1.5 - 4.5 g/dL  ? Albumin/Globulin Ratio 1.8 1.2 - 2.2  ? Bilirubin Total 0.6 0.0 - 1.2 mg/dL  ? Alkaline Phosphatase 54 44 - 121 IU/L  ? AST 29 0 - 40 IU/L  ? ALT 40 0 - 44 IU/L  ?Bilirubin, Direct  ?Result Value Ref Range  ? Bilirubin, Direct 0.17 0.00 - 0.40 mg/dL  ?Hepatitis A antibody, IgM  ?Result Value Ref Range  ? Hep A IgM Negative Negative  ?Hepatitis B Surface AntiGEN  ?Result Value Ref Range  ? Hepatitis B Surface Ag Negative Negative  ?Hepatitis B Surface AntiBODY  ?Result Value Ref Range  ? Hep B Surface Ab, Qual Non Reactive   ?Hepatitis C Antibody  ?Result Value Ref  Range  ? Hep C Virus Ab Non Reactive Non Reactive  ? ?   ? ? ?Current Outpatient Medications:  ??  acetaminophen (TYLENOL) 500 MG tablet, Take 1,000 mg by mouth every 8 (eight) hours as needed for moderate pain., Disp: , Rfl:  ??  Fexofenadine-Pseudoephedrine (ALLEGRA-D PO), Take 1 tablet by mouth daily as needed (allergies)., Disp: , Rfl:  ??  triamcinolone (NASACORT) 55 MCG/ACT AERO nasal inhaler, Place 2 sprays into the nose daily. (Patient taking differently: Place 2 sprays into the nose daily as needed (allergies).), Disp: 1 each, Rfl: 4 ??  amLODipine (NORVASC) 5 MG tablet, Take 1 tablet (5 mg total) by mouth daily., Disp: 90 tablet, Rfl:  3 ??  omeprazole (PRILOSEC) 20 MG capsule, Take 1 capsule (20 mg total) by mouth daily., Disp: 30 capsule, Rfl: 3  ? ? ?Assessment & Plan:  ?IMPRESSION: ?1. Multiple small hepatic cysts. No suspicious hepatic mass ?identified. ?2. Small renal cysts. ? ? ?Sees Dr. Allen Norris  ?Fu with GI as scheduled  ?Will recheck LFTS including bilirubin levels.  ? ?ETOH abuse improved now drinks about 4 - 5 beers a week.  ?Mri wnl.  ? ?GERD / indigestion  ?patient advised to avoid laying down soon after his meals. He took a 2 hours between dinner and bedtime. Avoid spicy food and triggers that he knows food wise that worsen his acid reflux. Patient verbalized understanding of the above. Lifestyle modifications as above discussed with patient. ? ?Wheezing/ allergies will start pt on albuterol q 4-6 hrly.  ? ? ? ?Problem List Items Addressed This Visit   ? ?  ? Digestive  ? Gastroesophageal reflux disease without esophagitis  ? Relevant Medications  ? omeprazole (PRILOSEC) 20 MG capsule  ?  ? Other  ? Elevated bilirubin  ? Relevant Orders  ? CBC with Differential/Platelet  ? Comprehensive metabolic panel  ? Urinalysis, Routine w reflex microscopic  ? Elevated LFTs - Primary  ?  ? ?Orders Placed This Encounter  ?Procedures  ?? CBC with Differential/Platelet  ?? Comprehensive metabolic panel  ??  Urinalysis, Routine w reflex microscopic  ?  ? ?Meds ordered this encounter  ?Medications  ?? omeprazole (PRILOSEC) 20 MG capsule  ?  Sig: Take 1 capsule (20 mg total) by mouth daily.  ?  Dispense:  30 capsule  ?  Refi

## 2021-08-03 LAB — CBC WITH DIFFERENTIAL/PLATELET
Basophils Absolute: 0 10*3/uL (ref 0.0–0.2)
Basos: 1 %
EOS (ABSOLUTE): 0.4 10*3/uL (ref 0.0–0.4)
Eos: 6 %
Hematocrit: 43.8 % (ref 37.5–51.0)
Hemoglobin: 14.9 g/dL (ref 13.0–17.7)
Immature Grans (Abs): 0 10*3/uL (ref 0.0–0.1)
Immature Granulocytes: 0 %
Lymphocytes Absolute: 1.2 10*3/uL (ref 0.7–3.1)
Lymphs: 21 %
MCH: 29.1 pg (ref 26.6–33.0)
MCHC: 34 g/dL (ref 31.5–35.7)
MCV: 86 fL (ref 79–97)
Monocytes Absolute: 0.3 10*3/uL (ref 0.1–0.9)
Monocytes: 6 %
Neutrophils Absolute: 3.9 10*3/uL (ref 1.4–7.0)
Neutrophils: 66 %
Platelets: 236 10*3/uL (ref 150–450)
RBC: 5.12 x10E6/uL (ref 4.14–5.80)
RDW: 11.8 % (ref 11.6–15.4)
WBC: 5.9 10*3/uL (ref 3.4–10.8)

## 2021-08-03 LAB — COMPREHENSIVE METABOLIC PANEL
ALT: 32 IU/L (ref 0–44)
AST: 23 IU/L (ref 0–40)
Albumin/Globulin Ratio: 1.7 (ref 1.2–2.2)
Albumin: 4.6 g/dL (ref 3.8–4.9)
Alkaline Phosphatase: 55 IU/L (ref 44–121)
BUN/Creatinine Ratio: 8 — ABNORMAL LOW (ref 10–24)
BUN: 11 mg/dL (ref 8–27)
Bilirubin Total: 0.6 mg/dL (ref 0.0–1.2)
CO2: 23 mmol/L (ref 20–29)
Calcium: 9.5 mg/dL (ref 8.6–10.2)
Chloride: 106 mmol/L (ref 96–106)
Creatinine, Ser: 1.37 mg/dL — ABNORMAL HIGH (ref 0.76–1.27)
Globulin, Total: 2.7 g/dL (ref 1.5–4.5)
Glucose: 90 mg/dL (ref 70–99)
Potassium: 4.5 mmol/L (ref 3.5–5.2)
Sodium: 143 mmol/L (ref 134–144)
Total Protein: 7.3 g/dL (ref 6.0–8.5)
eGFR: 59 mL/min/{1.73_m2} — ABNORMAL LOW (ref >59)

## 2021-08-07 ENCOUNTER — Encounter: Payer: Self-pay | Admitting: Physical Therapy

## 2021-08-07 ENCOUNTER — Ambulatory Visit: Payer: 59 | Admitting: Physical Therapy

## 2021-08-07 DIAGNOSIS — G8929 Other chronic pain: Secondary | ICD-10-CM

## 2021-08-07 NOTE — Therapy (Signed)
[facility] ?[address] ?[place], [place], [address] ?Phone: [phone]   Fax:  [phone] ? ?Physical Therapy Treatment ? ?Patient Details  ?Name: Craig Finley ?MRN: [id number] ?Date of Birth: Mar 07, 1961 ?Referring Provider (PT): Vanetta Mulders ? ? ?Encounter Date: 08/07/2021 ? ? PT End of Session - 08/07/21 1528   ? ? Visit Number 22   ? Number of Visits 41   ? Date for PT Re-Evaluation 09/14/21   ? Authorization Type [employer] Generic- VL of 30 per calendar year   ? Authorization Time Period 04/26/21-06/22/21, 3/31/223-09/14/21   ? Authorization - Visit Number 22   ? Authorization - Number of Visits 30   ? Progress Note Due on Visit 30   ? PT Start Time 1515   ? PT Stop Time [id number]   ? PT Time Calculation (min) 40 min   ? Activity Tolerance Patient tolerated treatment well;No increased pain   ? Behavior During Therapy [facility] for tasks assessed/performed   ? ?  ?  ? ?  ? ? ?Past Medical History:  ?Diagnosis Date  ? Allergy   ? Blood in stool   ? Chest pain   ? a. 11/2019 Cor CTA: Ca2 = 0. Nl cors.  ? CKD (chronic kidney disease) stage 2, GFR 60-89 ml/min   ? GERD (gastroesophageal reflux disease)   ? Heart murmur   ? as a child:  ? Hypertension   ? Pulmonary nodules   ? a. 11/2019 Cor CTA: Small L lung nodules - largest 68mm - incidental finding; b. 12/2020 Chest CT: Sm bilat pulm nodules - unchanged.  ? ? ?Past Surgical History:  ?Procedure Laterality Date  ? ANAL FISSURE REPAIR  05/1998  ? some type of anal fissure procedure  ? ARTHOSCOPIC ROTAOR CUFF REPAIR Right 04/23/2021  ? Procedure: Right ARTHROSCOPIC ROTATOR CUFF REPAIR;  Surgeon: Vanetta Mulders, MD;  Location: [place];  Service: Orthopedics;  Laterality: Right;  ? COLONOSCOPY WITH PROPOFOL N/A 01/31/2020  ? Procedure: COLONOSCOPY WITH PROPOFOL;  Surgeon: Lin Landsman, MD;  Location: [facility] ENDOSCOPY;  Service: Gastroenterology;  Laterality: N/A;  ? ESOPHAGOGASTRODUODENOSCOPY (EGD) WITH  PROPOFOL N/A 01/31/2020  ? Procedure: ESOPHAGOGASTRODUODENOSCOPY (EGD) WITH PROPOFOL;  Surgeon: Lin Landsman, MD;  Location: [facility] ENDOSCOPY;  Service: Gastroenterology;  Laterality: N/A;  ? ? ?There were no vitals filed for this visit. ? ? Subjective Assessment - 08/07/21 1518   ? ? Subjective Patient reports his shoulder felt good the past couple nights, but he slept on it wrong last night. 1.5/10. Reports compliance with HEP.   ? Pertinent History Craig Finley is a 70yoM who is referred to OPPT for rehabilitzation s/p Rt shoulder arthroscopic supraspinatus repair.   ? Limitations Lifting;House hold activities   ? How long can you sit comfortably? unlimited   ? How long can you stand comfortably? unlimited   ? How long can you walk comfortably? unlimited   ? Diagnostic tests xray, unremarkable aside from age-typical arthrosis   ? Patient Stated Goals pnt would like to sleep comfortably and return to softball   ? Pain Onset 1 to 4 weeks ago   ? ?  ?  ? ?  ? ? ? ?Therex: ?TRX flex and abd walkouts x12 each with good AAROM noted ?-TRX row in min range 3x 10 with cuing for eccentric control and time under tension with good carry over ?- Wall push up 3x 10 with min cuing to prevent shoulder "hiking"  with good carry over ?- wt'd ball catch and throw at rebounder 2# x12; 4# x12 ?- Scap taps in push up position on elevated mat table 2x 12 with good carry over of proper technique following set up ?- Standing shoulder flex 3# 2x 10 against wall to help with scapulohumeral rhythm with decent carry over ?- Standing shoulder abd 2# short lever arm 90d only 2x 10  ?- Y on wall 2x 12 with good carry over of proper technique with scapular retraction without excessive thoracic ext ? ? ? ? ? ? ? ? ? ? ? ? ? ? ? ? ? ? ? ? ? ? ? ? ? PT Education - 08/07/21 1520   ? ? Education Details therex form/technique   ? Person(s) Educated Patient   ? Methods Explanation;Demonstration;Verbal cues   ? Comprehension Verbalized  understanding;Returned demonstration;Verbal cues required   ? ?  ?  ? ?  ? ? ? PT Short Term Goals - 06/12/21 1641   ? ?  ? PT SHORT TERM GOAL #1  ? Title Pt will demonstrate independence with HEP to improve R shoulder function for increased ability to participate with ADLs.   ? Baseline 04/26/21 HEP given   ? Time 4   ? Period Weeks   ? Status Achieved   ? Target Date 05/24/21   ? ?  ?  ? ?  ? ? ? ? PT Long Term Goals - 07/24/21 1402   ? ?  ? PT LONG TERM GOAL #1  ? Title Pt will increase R shoulder abduction and flexion ROM to >170 degrees in order to complete functional ADL   ? Baseline 04/26/21 unable to assess; 06/12/21: 85-95 degrees; 07/24/21: F 149, ABD 141 (AROM)   ? Time 8   ? Period Weeks   ? Status On-going   ? Target Date 09/14/21   ?  ? PT LONG TERM GOAL #2  ? Title Pt will be able to complete Apley scratch test on Rt shoulder and touch inferior border of left scapula to show clinically significant increase in IR to shower independently.   ? Baseline 04/26/21 unable to access; 3/21: ROM stil very limited, max effort to bring Rt thumb to central spine.; 07/24/21: limited - able to reach to anterior shoulder (horizontal adduction), T1 (ER), L2 (IR).   ? Time 8   ? Period Weeks   ? Status On-going   ? Target Date 09/14/21   ?  ? PT LONG TERM GOAL #3  ? Title Pt will increase R grip dynomometer score by to 85# to demonstrate PLOF strength evidenced by uninvolved side needed for heavy household tasks.   ? Baseline 04/26/21 R: 25lbs; 07/24/21: 75lbs   ? Time 8   ? Period Weeks   ? Status On-going   ? Target Date 09/14/21   ?  ? PT LONG TERM GOAL #4  ? Title Pt will demonstrate gross R shoulder and elbow strength of 5/5 in order to demonstrate PLOF strength for heavy household ADLs and return to sport   ? Baseline 04/26/21 no test performed; 07/24/21: generally 4/5 shoulder and 4+/5 elbow   ? Time 8   ? Period Weeks   ? Status On-going   ? Target Date 09/14/21   ? ?  ?  ? ?  ? ? ? ? ? ? ? ? Plan - 08/07/21 1558   ? ? Clinical  Impression Statement PT continued therex progression for increased mobility, strength, scapulohumeral  rhythm with introduction of plyometric strengthening with success. Patient is able to comply with all cuing for proper technique of therex with good motivation throughout session. PT will conitnue progression as able.   ? Personal Factors and Comorbidities Past/Current Experience;Age   ? Examination-Activity Limitations Carry;Lift;Bed Mobility;Sleep;Dressing;Reach Overhead;Hygiene/Grooming   ? Examination-Participation Restrictions Driving;Cleaning;Meal Prep;Yard Work;Community Activity;Laundry;Occupation   ? Stability/Clinical Decision Making Evolving/Moderate complexity   ? Clinical Decision Making Moderate   ? Rehab Potential Good   ? PT Frequency 2x / week   ? PT Duration 8 weeks   ? PT Treatment/Interventions ADLs/Self Care Home Management;Cryotherapy;Electrical Stimulation;Iontophoresis 4mg /ml Dexamethasone;Moist Heat;Traction;Ultrasound;Functional mobility training;Therapeutic activities;Neuromuscular re-education;Therapeutic exercise;Patient/family education;Manual techniques;Passive range of motion;Dry needling;Splinting;Scar mobilization;Spinal Manipulations;Joint Manipulations   ? PT Next Visit Plan ROM, strength, update HEP as needed   ? PT Home Exercise Plan Access Code: [email or web address] (updated 4/11); Access Code: [email or web address] (07/17/21); Access Code: [email or web address] (2nd HEP for "off days")   ? Consulted and Agree with Plan of Care Patient   ? ?  ?  ? ?  ? ? ?Patient will benefit from skilled therapeutic intervention in order to improve the following deficits and impairments:  Decreased activity tolerance, Decreased endurance, Decreased range of motion, Decreased strength, Hypomobility, Increased fascial restricitons, Impaired UE functional use, Pain, Decreased coordination, Decreased mobility, Postural dysfunction ? ?Visit Diagnosis: ?Chronic right shoulder pain ? ? ? ? ?Problem List ?Patient Active Problem List  ?  Diagnosis Date Noted  ? Elevated LFTs 08/02/2021  ? Gastroesophageal reflux disease without esophagitis 08/02/2021  ? Wheezing 08/02/2021  ? Elevated bilirubin 06/21/2021  ? ETOH abuse 06/21/2021  ? Complete tear of right

## 2021-08-09 ENCOUNTER — Encounter: Payer: 59 | Admitting: Physical Therapy

## 2021-08-13 ENCOUNTER — Telehealth: Payer: Self-pay | Admitting: Internal Medicine

## 2021-08-13 ENCOUNTER — Ambulatory Visit: Payer: 59

## 2021-08-13 DIAGNOSIS — G8929 Other chronic pain: Secondary | ICD-10-CM

## 2021-08-13 NOTE — Telephone Encounter (Signed)
Patient called to see if he still needed his lab appointment on 08/16/21. He was seen on 08/02/21 and had labs done then. Would like a call back please

## 2021-08-13 NOTE — Telephone Encounter (Signed)
Called to speak with patient and let him know that he would not need to have labs done on 08/16/21, wife said he was not there but would return call. Is ok for PED Nurse triage to give message.

## 2021-08-13 NOTE — Therapy (Signed)
[facility] [address], [place], [address] Phone: [phone]   Fax:  [phone]  Physical Therapy Treatment  Patient Details  Name: Craig Finley MRN: [id number] Date of Birth: [birth year] Referring Provider (PT): Vanetta Mulders   Encounter Date: 08/13/2021   PT End of Session - 08/13/21 1359     Visit Number 23    Number of Visits 41    Date for PT Re-Evaluation 09/14/21    Authorization Type [employer] Generic- VL of 30 per calendar year    Authorization Time Period 04/26/21-06/22/21, 3/31/223-09/14/21    Authorization - Visit Number 23    Authorization - Number of Visits 30    Progress Note Due on Visit 30    PT Start Time 1351    PT Stop Time 1430    PT Time Calculation (min) 39 min    Activity Tolerance Patient tolerated treatment well;No increased pain    Behavior During Therapy [facility] for tasks assessed/performed             Past Medical History:  Diagnosis Date   Allergy    Blood in stool    Chest pain    a. 11/2019 Cor CTA: Ca2 = 0. Nl cors.   CKD (chronic kidney disease) stage 2, GFR 60-89 ml/min    GERD (gastroesophageal reflux disease)    Heart murmur    as a child:   Hypertension    Pulmonary nodules    a. 11/2019 Cor CTA: Small L lung nodules - largest 61mm - incidental finding; b. 12/2020 Chest CT: Sm bilat pulm nodules - unchanged.    Past Surgical History:  Procedure Laterality Date   ANAL FISSURE REPAIR  05/1998   some type of anal fissure procedure   ARTHOSCOPIC ROTAOR CUFF REPAIR Right 04/23/2021   Procedure: Right ARTHROSCOPIC ROTATOR CUFF REPAIR;  Surgeon: Vanetta Mulders, MD;  Location: [place];  Service: Orthopedics;  Laterality: Right;   COLONOSCOPY WITH PROPOFOL N/A 01/31/2020   Procedure: COLONOSCOPY WITH PROPOFOL;  Surgeon: Lin Landsman, MD;  Location: [facility] ENDOSCOPY;  Service: Gastroenterology;  Laterality: N/A;   ESOPHAGOGASTRODUODENOSCOPY (EGD) WITH  PROPOFOL N/A 01/31/2020   Procedure: ESOPHAGOGASTRODUODENOSCOPY (EGD) WITH PROPOFOL;  Surgeon: Lin Landsman, MD;  Location: [facility] ENDOSCOPY;  Service: Gastroenterology;  Laterality: N/A;    There were no vitals filed for this visit.   Subjective Assessment - 08/13/21 1354     Subjective Pt notes 1.5/10 pain that is just nagging at this point.  Pt notes sleeping has been getting better as well.  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    Pertinent History Craig Finley is a 68yoM who is referred to OPPT for rehabilitzation s/p Rt shoulder arthroscopic supraspinatus repair.    Limitations Lifting;House hold activities    How long can you sit comfortably? unlimited    How long can you stand comfortably? unlimited    How long can you walk comfortably? unlimited    Diagnostic tests xray, unremarkable aside from age-typical arthrosis    Patient Stated Goals pnt would like to sleep comfortably and return to softball    Pain Onset 1 to 4 weeks ago                 Therex: TRX flex and abd walkouts 2x12 each with good AAROM noted TRX row in min range 3x 10 with cuing for eccentric control and time under  tension with good carry over Wall push up 3x 10 with min cuing to prevent shoulder "hiking" with good carry over Weighted ball catch and throw at rebounder, blue weighted ball, 2x15 Weighted ball arm circles in 90 deg flexion, 2x30 sec each direction CW/CCW Standing shoulder abd 2# short lever arm 90d only 2x 10  Standing rhythmic stabilization, GTB, in flexion/abduction to tolerance, 30 sec bouts Standing wall slides with lift off at peak (Y's), 2x15                PT Short Term Goals - 06/12/21 1641       PT SHORT TERM GOAL #1   Title Pt will demonstrate independence with HEP to improve R shoulder function for increased ability to participate with ADLs.    Baseline 04/26/21 HEP given    Time 4    Period Weeks     Status Achieved    Target Date 05/24/21               PT Long Term Goals - 07/24/21 1402       PT LONG TERM GOAL #1   Title Pt will increase R shoulder abduction and flexion ROM to >170 degrees in order to complete functional ADL    Baseline 04/26/21 unable to assess; 06/12/21: 85-95 degrees; 07/24/21: F 149, ABD 141 (AROM)    Time 8    Period Weeks    Status On-going    Target Date 09/14/21      PT LONG TERM GOAL #2   Title Pt will be able to complete Apley scratch test on Rt shoulder and touch inferior border of left scapula to show clinically significant increase in IR to shower independently.    Baseline 04/26/21 unable to access; 3/21: ROM stil very limited, max effort to bring Rt thumb to central spine.; 07/24/21: limited - able to reach to anterior shoulder (horizontal adduction), T1 (ER), L2 (IR).    Time 8    Period Weeks    Status On-going    Target Date 09/14/21      PT LONG TERM GOAL #3   Title Pt will increase R grip dynomometer score by to 85# to demonstrate PLOF strength evidenced by uninvolved side needed for heavy household tasks.    Baseline 04/26/21 R: 25lbs; 07/24/21: 75lbs    Time 8    Period Weeks    Status On-going    Target Date 09/14/21      PT LONG TERM GOAL #4   Title Pt will demonstrate gross R shoulder and elbow strength of 5/5 in order to demonstrate PLOF strength for heavy household ADLs and return to sport    Baseline 04/26/21 no test performed; 07/24/21: generally 4/5 shoulder and 4+/5 elbow    Time 8    Period Weeks    Status On-going    Target Date 09/14/21                   Plan - 08/13/21 1521     Clinical Impression Statement Pt continues to improve with progression of therex and noted good strength while completing exercises.  Pt has more limitations due to the decrease in overall ROM of his shoulder at this time.  Pt continues to remain motivated and participate in all therapeutic exercises with good technique and attempts to perform  with correct posture.  Pt will conintue to benefit from skilled therapy to address remaining deficits and was introduced to new exercises that he will attempt  to perform at home as part of HEP.    Personal Factors and Comorbidities Past/Current Experience;Age    Examination-Activity Limitations Carry;Lift;Bed Mobility;Sleep;Dressing;Reach Overhead;Hygiene/Grooming    Examination-Participation Restrictions Driving;Cleaning;Meal Prep;Yard Work;Community Activity;Laundry;Occupation    Stability/Clinical Decision Making Evolving/Moderate complexity    Rehab Potential Good    PT Frequency 2x / week    PT Duration 8 weeks    PT Treatment/Interventions ADLs/Self Care Home Management;Cryotherapy;Electrical Stimulation;Iontophoresis 4mg /ml Dexamethasone;Moist Heat;Traction;Ultrasound;Functional mobility training;Therapeutic activities;Neuromuscular re-education;Therapeutic exercise;Patient/family education;Manual techniques;Passive range of motion;Dry needling;Splinting;Scar mobilization;Spinal Manipulations;Joint Manipulations    PT Next Visit Plan ROM, strength, update HEP as needed    PT Home Exercise Plan Access Code: [email or web address] (updated 4/11); Access Code: [email or web address] (07/17/21); Access Code: [email or web address] (2nd HEP for "off days")    Consulted and Agree with Plan of Care Patient             Patient will benefit from skilled therapeutic intervention in order to improve the following deficits and impairments:  Decreased activity tolerance, Decreased endurance, Decreased range of motion, Decreased strength, Hypomobility, Increased fascial restricitons, Impaired UE functional use, Pain, Decreased coordination, Decreased mobility, Postural dysfunction  Visit Diagnosis: Chronic right shoulder pain     Problem List Patient Active Problem List   Diagnosis Date Noted   Elevated LFTs 08/02/2021   Gastroesophageal reflux disease without esophagitis 08/02/2021   Wheezing 08/02/2021   Elevated bilirubin  06/21/2021   ETOH abuse 06/21/2021   Complete tear of right rotator cuff 03/22/2021   Right shoulder pain 01/30/2021   Primary hypertension 01/30/2021   Non-seasonal allergic rhinitis 02/14/2020   Chest pain, non-cardiac 01/07/2020   Pulmonary nodules 01/07/2020   Essential hypertension 12/09/2019   Elevated serum creatinine 11/27/2019   Elevated BP without diagnosis of hypertension 11/27/2019   Rectal bleeding 11/04/2019   Gastroesophageal reflux disease 11/04/2019   Chest pain 11/04/2019   SOB (shortness of breath) 11/04/2019   BMI 30.0-30.9,adult 11/04/2019   Encounter for HCV screening test for low risk patient 11/04/2019   Screening for HIV (human immunodeficiency virus) 11/04/2019   Encounter for screening and preventative care 11/04/2019   Tobacco chew use 11/04/2019    Gwenlyn Saran, PT, DPT 08/13/21, 3:26 PM   [facility] [address] [place], [address] Phone: [phone]   Fax:  [phone]  Name: Orvid Baratta MRN: [id number] Date of Birth: 1961/02/21

## 2021-08-15 ENCOUNTER — Ambulatory Visit (INDEPENDENT_AMBULATORY_CARE_PROVIDER_SITE_OTHER): Payer: Self-pay | Admitting: Orthopaedic Surgery

## 2021-08-15 DIAGNOSIS — S46011A Strain of muscle(s) and tendon(s) of the rotator cuff of right shoulder, initial encounter: Secondary | ICD-10-CM

## 2021-08-15 NOTE — Progress Notes (Signed)
Post Operative Evaluation    Procedure/Date of Surgery: Right shoulder rotator cuff repair done on April 23, 2021  Presents today for follow-up of his right shoulder.  He continues to make improvement with physical therapy.  At this time he is asking about returning to work.  Overall he does feel that his motion is dramatically better although he is continuing to work on strengthening and endurance.  PMH/PSH/Family History/Social History/Meds/Allergies:    Past Medical History:  Diagnosis Date   Allergy    Blood in stool    Chest pain    a. 11/2019 Cor CTA: Ca2 = 0. Nl cors.   CKD (chronic kidney disease) stage 2, GFR 60-89 ml/min    GERD (gastroesophageal reflux disease)    Heart murmur    as a child:   Hypertension    Pulmonary nodules    a. 11/2019 Cor CTA: Small L lung nodules - largest 25mm - incidental finding; b. 12/2020 Chest CT: Sm bilat pulm nodules - unchanged.   Past Surgical History:  Procedure Laterality Date   ANAL FISSURE REPAIR  05/1998   some type of anal fissure procedure   ARTHOSCOPIC ROTAOR CUFF REPAIR Right 04/23/2021   Procedure: Right ARTHROSCOPIC ROTATOR CUFF REPAIR;  Surgeon: Vanetta Mulders, MD;  Location: [place];  Service: Orthopedics;  Laterality: Right;   COLONOSCOPY WITH PROPOFOL N/A 01/31/2020   Procedure: COLONOSCOPY WITH PROPOFOL;  Surgeon: Lin Landsman, MD;  Location: [facility] ENDOSCOPY;  Service: Gastroenterology;  Laterality: N/A;   ESOPHAGOGASTRODUODENOSCOPY (EGD) WITH PROPOFOL N/A 01/31/2020   Procedure: ESOPHAGOGASTRODUODENOSCOPY (EGD) WITH PROPOFOL;  Surgeon: Lin Landsman, MD;  Location: [facility] ENDOSCOPY;  Service: Gastroenterology;  Laterality: N/A;   Social History   Socioeconomic History   Marital status: Married    Spouse name: Not on file   Number of children: Not on file   Years of education: Not on file   Highest education level: Some college, no degree  Occupational History    Occupation: trucker  Tobacco Use   Smoking status: Never   Smokeless tobacco: Former    Types: Snuff    Quit date: 04/02/2021   Tobacco comments:    dipping x 25-30 years  Vaping Use   Vaping Use: Never used  Substance and Sexual Activity   Alcohol use: Yes    Comment: drinks 4-7 beers some days and none others. 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    Social Determinants of Health   Financial Resource Strain: Not on file  Food Insecurity: Not on file  Transportation Needs: Not on file  Physical Activity: Not on file  Stress: Not on file  Social Connections: Not on file   Family History  Problem Relation Age of Onset   Arthritis Mother    Asthma Mother    Hypertension Mother    Hearing loss Father    Alcohol abuse Sister    Cancer Sister    Depression Sister    Drug abuse Sister    Early death Sister    Heart attack Sister    Hypertension Sister    Miscarriages /  Stillbirths Sister    Asthma Sister    Depression Sister    Heart disease Maternal Uncle    Heart disease Maternal Uncle    Alcohol abuse Maternal Grandmother    Cancer Maternal Grandmother    Cancer Maternal Grandfather    Asthma Daughter    Asthma Son    No Known Allergies Current Outpatient Medications  Medication Sig Dispense Refill   acetaminophen (TYLENOL) 500 MG tablet Take 1,000 mg by mouth every 8 (eight) hours as needed for moderate pain.     albuterol (VENTOLIN HFA) 108 (90 Base) MCG/ACT inhaler Inhale 2 puffs into the lungs every 6 (six) hours as needed for wheezing or shortness of breath. 8 g 2   amLODipine (NORVASC) 5 MG tablet Take 1 tablet (5 mg total) by mouth daily. 90 tablet 3   fexofenadine (ALLEGRA ALLERGY) 180 MG tablet Take 1 tablet (180 mg total) by mouth daily. 10 tablet 1   Fexofenadine-Pseudoephedrine (ALLEGRA-D PO) Take 1  tablet by mouth daily as needed (allergies).     omeprazole (PRILOSEC) 20 MG capsule Take 1 capsule (20 mg total) by mouth daily. 30 capsule 3   triamcinolone (NASACORT) 55 MCG/ACT AERO nasal inhaler Place 2 sprays into the nose daily. (Patient taking differently: Place 2 sprays into the nose daily as needed (allergies).) 1 each 4   No current facility-administered medications for this visit.   No results found.  Review of Systems:   A ROS was performed including pertinent positives and negatives as documented in the HPI.   Musculoskeletal Exam:    There were no vitals taken for this visit.  Incisions are clean dry and intact.  No erythema or drainage.  Active forward elevation is to 130 degrees.  External rotation at the side is to 50 sensation intact all distributions including axillary distribution 2+ radial pulse.  Internal rotation is to L3  Imaging:    None  I personally reviewed and interpreted the radiographs.   Assessment:   61 year old male 6 months status post right shoulder rotator cuff repair.  Overall he is doing well.  At this time I would like him to continue to work on weekly physical therapy.  He may return to work at this time.  I will see him back in 3 months for reassessment Plan :    -Return to clinic in 12 weeks     I personally saw and evaluated the patient, and participated in the management and treatment plan.  Vanetta Mulders, MD Attending Physician, Orthopedic Surgery  This document was dictated using Dragon voice recognition software. A reasonable attempt at proof reading has been made to minimize errors.

## 2021-08-16 ENCOUNTER — Encounter: Payer: 59 | Admitting: Physical Therapy

## 2021-08-16 ENCOUNTER — Other Ambulatory Visit: Payer: 59

## 2021-08-21 ENCOUNTER — Ambulatory Visit: Payer: 59 | Admitting: Physical Therapy

## 2021-08-21 ENCOUNTER — Encounter: Payer: Self-pay | Admitting: Physical Therapy

## 2021-08-21 DIAGNOSIS — G8929 Other chronic pain: Secondary | ICD-10-CM

## 2021-08-21 NOTE — Therapy (Signed)
[facility] [address] [place], [address] Phone: [phone]   Fax:  [phone]  Physical Therapy Treatment  Patient Details  Name: Craig Finley MRN: [id number] Date of Birth: 06-16-1960 Referring Provider (PT): Vanetta Mulders   Encounter Date: 08/21/2021   PT End of Session - 08/21/21 1312     Visit Number 24    Number of Visits 41    Date for PT Re-Evaluation 09/14/21    Authorization Type [employer] Generic- VL of 30 per calendar year    Authorization Time Period 04/26/21-06/22/21, 3/31/223-09/14/21    Authorization - Visit Number 24    Authorization - Number of Visits 30    Progress Note Due on Visit 30    PT Start Time [id number]    PT Stop Time 1345    PT Time Calculation (min) 38 min    Activity Tolerance Patient tolerated treatment well;No increased pain    Behavior During Therapy [facility] for tasks assessed/performed             Past Medical History:  Diagnosis Date   Allergy    Blood in stool    Chest pain    a. 11/2019 Cor CTA: Ca2 = 0. Nl cors.   CKD (chronic kidney disease) stage 2, GFR 60-89 ml/min    GERD (gastroesophageal reflux disease)    Heart murmur    as a child:   Hypertension    Pulmonary nodules    a. 11/2019 Cor CTA: Small L lung nodules - largest 17mm - incidental finding; b. 12/2020 Chest CT: Sm bilat pulm nodules - unchanged.    Past Surgical History:  Procedure Laterality Date   ANAL FISSURE REPAIR  05/1998   some type of anal fissure procedure   ARTHOSCOPIC ROTAOR CUFF REPAIR Right 04/23/2021   Procedure: Right ARTHROSCOPIC ROTATOR CUFF REPAIR;  Surgeon: Vanetta Mulders, MD;  Location: [facility];  Service: Orthopedics;  Laterality: Right;   COLONOSCOPY WITH PROPOFOL N/A 01/31/2020   Procedure: COLONOSCOPY WITH PROPOFOL;  Surgeon: Lin Landsman, MD;  Location: [facility] ENDOSCOPY;  Service: Gastroenterology;  Laterality: N/A;   ESOPHAGOGASTRODUODENOSCOPY (EGD) WITH  PROPOFOL N/A 01/31/2020   Procedure: ESOPHAGOGASTRODUODENOSCOPY (EGD) WITH PROPOFOL;  Surgeon: Lin Landsman, MD;  Location: [facility] ENDOSCOPY;  Service: Gastroenterology;  Laterality: N/A;    There were no vitals filed for this visit.   Subjective Assessment - 08/21/21 1310     Subjective Notes only 2/10 pain today. Motion is steady improving. No other concerns.    Pertinent History Craig Finley is a 89yoM who is referred to OPPT for rehabilitzation s/p Rt shoulder arthroscopic supraspinatus repair.    Limitations Lifting;House hold activities    How long can you sit comfortably? unlimited    How long can you stand comfortably? unlimited    How long can you walk comfortably? unlimited    Diagnostic tests xray, unremarkable aside from age-typical arthrosis    Patient Stated Goals pnt would like to sleep comfortably and return to softball    Pain Onset 1 to 4 weeks ago              Therex: TRX flex and abd walkouts 2x12 each with good AAROM noted Y supine with UE off table x10 TRX row in min range 3x 10 with cuing for eccentric control and time under tension with good carry over Weighted ball catch and throw at rebounder, blue weighted ball, 2x15 with throw with R UE only,  catch with LUE supporting  PT reviewed the following HEP with patient with patient able to demonstrate a set of the following with min cuing for correction needed. PT educated patient on parameters of therex (how/when to inc/decrease intensity, frequency, rep/set range, stretch hold time, and purpose of therex) with verbalized understanding.  Scap taps high push up from max mat height on bosu hardside with narrow hand position 2x 12 with good carry over of demo for set up  PT reviewed the following HEP with patient with patient able to demonstrate a set of the following with min cuing for correction needed. PT educated patient on parameters of therex (how/when to inc/decrease intensity, frequency, rep/set range,  stretch hold time, and purpose of therex) with verbalized understanding.   Access Code: [email or web address] - Standing Shoulder Abduction AAROM with Pulley Behind   - 1-3 x daily - 7 x weekly - 12-20 reps - 2sec hold - Standing Shoulder Flexion AAROM with Pulley Behind  - 1-3 x daily - 7 x weekly - 12-20 reps - 2sec hold - Low Trap Setting at [place]  - 1-3 x daily - 7 x weekly - 12-20 reps - 2sec hold - Prone Single Arm Shoulder Y  - 1 x daily - 2 x weekly - 3 sets - 6-10 reps - Prone Shoulder Horizontal Abduction  - 1 x daily - 2 x weekly - 3 sets - 6-10 reps - Bent Over Single Arm Shoulder Row with Dumbbell  - 1 x daily - 2 x weekly - 3 sets - 6-10 reps - Standing Single Arm Eccentric Bicep Curl Pronated then Supinated  - 1 x daily - 2 x weekly - 3 sets - 6-10 reps - Shoulder External Rotation with Anchored Resistance  - 1 x daily - 2 x weekly - 3 sets - 6-10 reps - Standing High Shoulder Row with Anchored Resistance  - 1 x daily - 2 x weekly - 3 sets - 6-10 reps                             PT Education - 08/21/21 1311     Education Details therex form/technique, HEP update    Person(s) Educated Patient    Methods Explanation;Demonstration;Verbal cues    Comprehension Verbalized understanding;Returned demonstration;Verbal cues required              PT Short Term Goals - 06/12/21 1641       PT SHORT TERM GOAL #1   Title Pt will demonstrate independence with HEP to improve R shoulder function for increased ability to participate with ADLs.    Baseline 04/26/21 HEP given    Time 4    Period Weeks    Status Achieved    Target Date 05/24/21               PT Long Term Goals - 07/24/21 1402       PT LONG TERM GOAL #1   Title Pt will increase R shoulder abduction and flexion ROM to >170 degrees in order to complete functional ADL    Baseline 04/26/21 unable to assess; 06/12/21: 85-95 degrees; 07/24/21: F 149, ABD 141 (AROM)    Time 8    Period Weeks    Status  On-going    Target Date 09/14/21      PT LONG TERM GOAL #2   Title Pt will be able to complete Apley scratch test on Rt shoulder and touch  inferior border of left scapula to show clinically significant increase in IR to shower independently.    Baseline 04/26/21 unable to access; 3/21: ROM stil very limited, max effort to bring Rt thumb to central spine.; 07/24/21: limited - able to reach to anterior shoulder (horizontal adduction), T1 (ER), L2 (IR).    Time 8    Period Weeks    Status On-going    Target Date 09/14/21      PT LONG TERM GOAL #3   Title Pt will increase R grip dynomometer score by to 85# to demonstrate PLOF strength evidenced by uninvolved side needed for heavy household tasks.    Baseline 04/26/21 R: 25lbs; 07/24/21: 75lbs    Time 8    Period Weeks    Status On-going    Target Date 09/14/21      PT LONG TERM GOAL #4   Title Pt will demonstrate gross R shoulder and elbow strength of 5/5 in order to demonstrate PLOF strength for heavy household ADLs and return to sport    Baseline 04/26/21 no test performed; 07/24/21: generally 4/5 shoulder and 4+/5 elbow    Time 8    Period Weeks    Status On-going    Target Date 09/14/21                   Plan - 08/21/21 1313     Clinical Impression Statement PT ocntinue dtherex progression with new HEP given based off sessions exercises with patient able to demonstrate and verbalize understanding of this. Pt is motivated througout session, able to comoply with all cuing for proper technique of all therex. PT will continue progression as able.    Personal Factors and Comorbidities Past/Current Experience;Age    Examination-Activity Limitations Carry;Lift;Bed Mobility;Sleep;Dressing;Reach Overhead;Hygiene/Grooming    Examination-Participation Restrictions Driving;Cleaning;Meal Prep;Yard Work;Community Activity;Laundry;Occupation    Stability/Clinical Decision Making Evolving/Moderate complexity    Clinical Decision Making Moderate     Rehab Potential Good    PT Frequency 2x / week    PT Duration 8 weeks    PT Treatment/Interventions ADLs/Self Care Home Management;Cryotherapy;Electrical Stimulation;Iontophoresis 4mg /ml Dexamethasone;Moist Heat;Traction;Ultrasound;Functional mobility training;Therapeutic activities;Neuromuscular re-education;Therapeutic exercise;Patient/family education;Manual techniques;Passive range of motion;Dry needling;Splinting;Scar mobilization;Spinal Manipulations;Joint Manipulations    PT Next Visit Plan ROM, strength, update HEP as needed    PT Home Exercise Plan Access Code: [email or web address] (updated 4/11); Access Code: [email or web address] (07/17/21); Access Code: [email or web address] (2nd HEP for "off days")    Consulted and Agree with Plan of Care Patient             Patient will benefit from skilled therapeutic intervention in order to improve the following deficits and impairments:  Decreased activity tolerance, Decreased endurance, Decreased range of motion, Decreased strength, Hypomobility, Increased fascial restricitons, Impaired UE functional use, Pain, Decreased coordination, Decreased mobility, Postural dysfunction  Visit Diagnosis: Chronic right shoulder pain     Problem List Patient Active Problem List   Diagnosis Date Noted   Elevated LFTs 08/02/2021   Gastroesophageal reflux disease without esophagitis 08/02/2021   Wheezing 08/02/2021   Elevated bilirubin 06/21/2021   ETOH abuse 06/21/2021   Complete tear of right rotator cuff 03/22/2021   Right shoulder pain 01/30/2021   Primary hypertension 01/30/2021   Non-seasonal allergic rhinitis 02/14/2020   Chest pain, non-cardiac 01/07/2020   Pulmonary nodules 01/07/2020   Essential hypertension 12/09/2019   Elevated serum creatinine 11/27/2019   Elevated BP without diagnosis of hypertension 11/27/2019   Rectal bleeding 11/04/2019   Gastroesophageal reflux disease 11/04/2019   Chest  pain 11/04/2019   SOB (shortness of breath) 11/04/2019   BMI  30.0-30.9,adult 11/04/2019   Encounter for HCV screening test for low risk patient 11/04/2019   Screening for HIV (human immunodeficiency virus) 11/04/2019   Encounter for screening and preventative care 11/04/2019   Tobacco chew use 11/04/2019   Durwin Reges DPT Durwin Reges, PT 08/21/2021, 3:19 PM  [facility] [address], [place], [address] Phone: [phone]   Fax:  [phone]  Name: Craig Finley MRN: [id number] Date of Birth: Sep 16, 1960

## 2021-08-23 ENCOUNTER — Ambulatory Visit: Payer: 59 | Admitting: Internal Medicine

## 2021-08-23 ENCOUNTER — Encounter: Payer: 59 | Admitting: Physical Therapy

## 2021-08-30 ENCOUNTER — Ambulatory Visit: Payer: 59 | Attending: Internal Medicine | Admitting: Physical Therapy

## 2021-08-30 ENCOUNTER — Encounter: Payer: Self-pay | Admitting: Physical Therapy

## 2021-08-30 DIAGNOSIS — G8929 Other chronic pain: Secondary | ICD-10-CM | POA: Diagnosis present

## 2021-08-30 DIAGNOSIS — M25511 Pain in right shoulder: Secondary | ICD-10-CM | POA: Insufficient documentation

## 2021-08-30 NOTE — Therapy (Signed)
[facility] [address], [place], [address] Phone: [phone]   Fax:  [phone]  Physical Therapy Treatment  Patient Details  Name: Craig Finley MRN: [id number] Date of Birth: 1960/11/06 Referring Provider (PT): Vanetta Mulders   Encounter Date: 08/30/2021   PT End of Session - 08/30/21 1421     Visit Number 25    Number of Visits 41    Date for PT Re-Evaluation 09/14/21    Authorization Type [employer] Generic- VL of 30 per calendar year    Authorization Time Period 04/26/21-06/22/21, 3/31/223-09/14/21    Authorization - Visit Number 25    Authorization - Number of Visits 30    Progress Note Due on Visit 30    PT Start Time 1350    PT Stop Time 1430    PT Time Calculation (min) 40 min    Activity Tolerance Patient tolerated treatment well;No increased pain    Behavior During Therapy [facility] for tasks assessed/performed             Past Medical History:  Diagnosis Date   Allergy    Blood in stool    Chest pain    a. 11/2019 Cor CTA: Ca2 = 0. Nl cors.   CKD (chronic kidney disease) stage 2, GFR 60-89 ml/min    GERD (gastroesophageal reflux disease)    Heart murmur    as a child:   Hypertension    Pulmonary nodules    a. 11/2019 Cor CTA: Small L lung nodules - largest 37mm - incidental finding; b. 12/2020 Chest CT: Sm bilat pulm nodules - unchanged.    Past Surgical History:  Procedure Laterality Date   ANAL FISSURE REPAIR  05/1998   some type of anal fissure procedure   ARTHOSCOPIC ROTAOR CUFF REPAIR Right 04/23/2021   Procedure: Right ARTHROSCOPIC ROTATOR CUFF REPAIR;  Surgeon: Vanetta Mulders, MD;  Location: [facility];  Service: Orthopedics;  Laterality: Right;   COLONOSCOPY WITH PROPOFOL N/A 01/31/2020   Procedure: COLONOSCOPY WITH PROPOFOL;  Surgeon: Lin Landsman, MD;  Location: [facility] ENDOSCOPY;  Service: Gastroenterology;  Laterality: N/A;   ESOPHAGOGASTRODUODENOSCOPY (EGD) WITH  PROPOFOL N/A 01/31/2020   Procedure: ESOPHAGOGASTRODUODENOSCOPY (EGD) WITH PROPOFOL;  Surgeon: Lin Landsman, MD;  Location: [facility] ENDOSCOPY;  Service: Gastroenterology;  Laterality: N/A;    There were no vitals filed for this visit.   Subjective Assessment - 08/30/21 1352     Subjective Pt reports 3/10 pain today. 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 Reports last night he was having a lot of discomfort and pain    Pertinent History [name] is a 57yoM who is referred to OPPT for rehabilitzation s/p Rt shoulder arthroscopic supraspinatus repair.    Limitations Lifting;House hold activities    How long can you sit comfortably? unlimited    How long can you stand comfortably? unlimited    Pain Onset 1 to 4 weeks ago            Therex: TRX flex and abd walkouts 2x12 each with good AAROM noted Y supine with UE off table x10 TRX row in min range 3x 10 with cuing for eccentric control and time under tension with good carry over Bent over fly 5# 3x 10 with good carry over of demo, cuing for scapular retraction without excessive hike Shrug 15# 2x 12 with min cuing  for eccentric control with good carry over  Farmers carry 30# 2x 169ft with min cuing throughout for posture Figure 8 wt'd ball in pillowcase 1kg Weighted ball catch and throw at rebounder, blue weighted ball, 2x12 with throw with R UE only, catch with LUE supporting UT stretch 30sec Baby eagle stretch 30sec Doorway pec stretch 30sec  Education on stretching following strengthening therex                            PT Education - 08/30/21 1419     Education Details therex form/technique    Person(s) Educated Patient    Methods Explanation;Verbal cues;Demonstration    Comprehension Returned demonstration;Verbalized understanding;Verbal cues required              PT Short Term Goals  - 06/12/21 1641       PT SHORT TERM GOAL #1   Title Pt will demonstrate independence with HEP to improve R shoulder function for increased ability to participate with ADLs.    Baseline 04/26/21 HEP given    Time 4    Period Weeks    Status Achieved    Target Date 05/24/21               PT Long Term Goals - 07/24/21 1402       PT LONG TERM GOAL #1   Title Pt will increase R shoulder abduction and flexion ROM to >170 degrees in order to complete functional ADL    Baseline 04/26/21 unable to assess; 06/12/21: 85-95 degrees; 07/24/21: F 149, ABD 141 (AROM)    Time 8    Period Weeks    Status On-going    Target Date 09/14/21      PT LONG TERM GOAL #2   Title Pt will be able to complete Apley scratch test on Rt shoulder and touch inferior border of left scapula to show clinically significant increase in IR to shower independently.    Baseline 04/26/21 unable to access; 3/21: ROM stil very limited, max effort to bring Rt thumb to central spine.; 07/24/21: limited - able to reach to anterior shoulder (horizontal adduction), T1 (ER), L2 (IR).    Time 8    Period Weeks    Status On-going    Target Date 09/14/21      PT LONG TERM GOAL #3   Title Pt will increase R grip dynomometer score by to 85# to demonstrate PLOF strength evidenced by uninvolved side needed for heavy household tasks.    Baseline 04/26/21 R: 25lbs; 07/24/21: 75lbs    Time 8    Period Weeks    Status On-going    Target Date 09/14/21      PT LONG TERM GOAL #4   Title Pt will demonstrate gross R shoulder and elbow strength of 5/5 in order to demonstrate PLOF strength for heavy household ADLs and return to sport    Baseline 04/26/21 no test performed; 07/24/21: generally 4/5 shoulder and 4+/5 elbow    Time 8    Period Weeks    Status On-going    Target Date 09/14/21                   Plan - 08/30/21 1429     Clinical Impression Statement PT continued therex progression for progression toward sport activity with  light plyometric training wiht success. patient is able to comply with all cuing for proper technqiue of therex with excellent motivation with  no increased pain thorughout session. PT educated patient on stretching post resistance training, ensuring resistance training only 2-3x/week (including 1x/week PT session), and taking 1-7min rest between sets of exercise to prevent soreness with understanding. PT will continue progressionas able.    Personal Factors and Comorbidities Past/Current Experience;Age    Examination-Activity Limitations Carry;Lift;Bed Mobility;Sleep;Dressing;Reach Overhead;Hygiene/Grooming    Examination-Participation Restrictions Driving;Cleaning;Meal Prep;Yard Work;Community Activity;Laundry;Occupation    Stability/Clinical Decision Making Evolving/Moderate complexity    Clinical Decision Making Moderate    Rehab Potential Good    PT Frequency 2x / week    PT Duration 8 weeks    PT Treatment/Interventions ADLs/Self Care Home Management;Cryotherapy;Electrical Stimulation;Iontophoresis 4mg /ml Dexamethasone;Moist Heat;Traction;Ultrasound;Functional mobility training;Therapeutic activities;Neuromuscular re-education;Therapeutic exercise;Patient/family education;Manual techniques;Passive range of motion;Dry needling;Splinting;Scar mobilization;Spinal Manipulations;Joint Manipulations    PT Next Visit Plan ROM, strength, update HEP as needed    PT Home Exercise Plan Access Code: [email or web address] (updated 4/11); Access Code: [email or web address] (07/17/21); Access Code: [email or web address] (2nd HEP for "off days")    Consulted and Agree with Plan of Care Patient             Patient will benefit from skilled therapeutic intervention in order to improve the following deficits and impairments:  Decreased activity tolerance, Decreased endurance, Decreased range of motion, Decreased strength, Hypomobility, Increased fascial restricitons, Impaired UE functional use, Pain, Decreased coordination, Decreased mobility,  Postural dysfunction  Visit Diagnosis: Chronic right shoulder pain     Problem List Patient Active Problem List   Diagnosis Date Noted   Elevated LFTs 08/02/2021   Gastroesophageal reflux disease without esophagitis 08/02/2021   Wheezing 08/02/2021   Elevated bilirubin 06/21/2021   ETOH abuse 06/21/2021   Complete tear of right rotator cuff 03/22/2021   Right shoulder pain 01/30/2021   Primary hypertension 01/30/2021   Non-seasonal allergic rhinitis 02/14/2020   Chest pain, non-cardiac 01/07/2020   Pulmonary nodules 01/07/2020   Essential hypertension 12/09/2019   Elevated serum creatinine 11/27/2019   Elevated BP without diagnosis of hypertension 11/27/2019   Rectal bleeding 11/04/2019   Gastroesophageal reflux disease 11/04/2019   Chest pain 11/04/2019   SOB (shortness of breath) 11/04/2019   BMI 30.0-30.9,adult 11/04/2019   Encounter for HCV screening test for low risk patient 11/04/2019   Screening for HIV (human immunodeficiency virus) 11/04/2019   Encounter for screening and preventative care 11/04/2019   Tobacco chew use 11/04/2019   Durwin Reges DPT Durwin Reges, PT 08/30/2021, 2:30 PM  [facility] [address] [place], [address] Phone: [phone]   Fax:  [phone]  Name: Kaimi Pfisterer MRN: [id number] Date of Birth: 1960/05/30

## 2021-09-03 ENCOUNTER — Telehealth: Payer: Self-pay | Admitting: Orthopaedic Surgery

## 2021-09-03 NOTE — Telephone Encounter (Signed)
Received medical records release form, 2 letters  and disability paperwork from patient/Forwarding to [facility]

## 2021-09-04 ENCOUNTER — Encounter: Payer: Self-pay | Admitting: Physical Therapy

## 2021-09-04 ENCOUNTER — Ambulatory Visit: Payer: 59 | Admitting: Physical Therapy

## 2021-09-04 DIAGNOSIS — G8929 Other chronic pain: Secondary | ICD-10-CM

## 2021-09-04 DIAGNOSIS — M25511 Pain in right shoulder: Secondary | ICD-10-CM | POA: Diagnosis not present

## 2021-09-04 NOTE — Therapy (Signed)
[facility] [address] [place], [address] Phone: [phone]   Fax:  [phone]  Physical Therapy Treatment  Patient Details  Name: Craig Finley MRN: [id number] Date of Birth: Mar 16, 1961 Referring Provider (PT): Vanetta Mulders   Encounter Date: 09/04/2021   PT End of Session - 09/04/21 1140     Visit Number 26    Number of Visits 41    Date for PT Re-Evaluation 09/14/21    Authorization Type [employer] Generic- VL of 30 per calendar year    Authorization Time Period 04/26/21-06/22/21, 3/31/223-09/14/21    Authorization - Visit Number 34    Authorization - Number of Visits 30    Progress Note Due on Visit 30    PT Start Time [id number]    PT Stop Time 1205    PT Time Calculation (min) 39 min    Activity Tolerance Patient tolerated treatment well;No increased pain    Behavior During Therapy [facility] for tasks assessed/performed             Past Medical History:  Diagnosis Date   Allergy    Blood in stool    Chest pain    a. 11/2019 Cor CTA: Ca2 = 0. Nl cors.   CKD (chronic kidney disease) stage 2, GFR 60-89 ml/min    GERD (gastroesophageal reflux disease)    Heart murmur    as a child:   Hypertension    Pulmonary nodules    a. 11/2019 Cor CTA: Small L lung nodules - largest 27mm - incidental finding; b. 12/2020 Chest CT: Sm bilat pulm nodules - unchanged.    Past Surgical History:  Procedure Laterality Date   ANAL FISSURE REPAIR  05/1998   some type of anal fissure procedure   ARTHOSCOPIC ROTAOR CUFF REPAIR Right 04/23/2021   Procedure: Right ARTHROSCOPIC ROTATOR CUFF REPAIR;  Surgeon: Vanetta Mulders, MD;  Location: [place];  Service: Orthopedics;  Laterality: Right;   COLONOSCOPY WITH PROPOFOL N/A 01/31/2020   Procedure: COLONOSCOPY WITH PROPOFOL;  Surgeon: Lin Landsman, MD;  Location: [facility] END[facility]COPY;  Service: Gastroenterology;  Laterality: N/A;   ESOPHAGOGASTRODUODENOSCOPY (EGD) WITH  PROPOFOL N/A 01/31/2020   Procedure: ESOPHAGOGASTRODUODENOSCOPY (EGD) WITH PROPOFOL;  Surgeon: Lin Landsman, MD;  Location: [facility] ENDOSCOPY;  Service: Gastroenterology;  Laterality: N/A;    There were no vitals filed for this visit.   Subjective Assessment - 09/04/21 1134     Subjective Patient reports no pain today. Took PT suggestions to dial back some exercises, and do AAROM flex/abd walkouts with cords not resistance bands with no pain following this change.    Pertinent History Craig Finley is a 50yoM who is referred to OPPT for rehabilitzation s/p Rt shoulder arthroscopic supraspinatus repair.    Limitations Lifting;House hold activities    How long can you sit comfortably? unlimited    How long can you stand comfortably? unlimited    How long can you walk comfortably? unlimited    Diagnostic tests xray, unremarkable aside from age-typical arthrosis    Patient Stated Goals pnt would like to sleep comfortably and return to softball    Pain Onset 1 to 4 weeks ago              Therex: TRX flex and abd walkouts 2x12 each with good AAROM noted Y supine with UE off table x10 Push up from elevated mat (all the way up) to small clap 3x 6 with cuing for equal push through  BUE with good carry over Weighted ball catch and throw at rebounder, blue weighted ball, 2x15 with throw with R UE only, catch with LUE supporting DB clean and snatch 7# 3x 6 with excellent carry over of proper technique Total gym pull up L18 3x 6 with min cuing for technique, good carry over of this and demo  UT stretch 30sec Baby eagle stretch 30sec Doorway pec stretch 30sec   Education on stretching following strengthening therex                            PT Education - 09/04/21 1140     Education Details therex form/technique    Person(s) Educated Patient    Methods Explanation;Verbal cues;Demonstration    Comprehension Verbalized understanding;Returned demonstration;Verbal  cues required              PT Short Term Goals - 06/12/21 1641       PT SHORT TERM GOAL #1   Title Pt will demonstrate independence with HEP to improve R shoulder function for increased ability to participate with ADLs.    Baseline 04/26/21 HEP given    Time 4    Period Weeks    Status Achieved    Target Date 05/24/21               PT Long Term Goals - 07/24/21 1402       PT LONG TERM GOAL #1   Title Pt will increase R shoulder abduction and flexion ROM to >170 degrees in order to complete functional ADL    Baseline 04/26/21 unable to assess; 06/12/21: 85-95 degrees; 07/24/21: F 149, ABD 141 (AROM)    Time 8    Period Weeks    Status On-going    Target Date 09/14/21      PT LONG TERM GOAL #2   Title Pt will be able to complete Apley scratch test on Rt shoulder and touch inferior border of left scapula to show clinically significant increase in IR to shower independently.    Baseline 04/26/21 unable to access; 3/21: ROM stil very limited, max effort to bring Rt thumb to central spine.; 07/24/21: limited - able to reach to anterior shoulder (horizontal adduction), T1 (ER), L2 (IR).    Time 8    Period Weeks    Status On-going    Target Date 09/14/21      PT LONG TERM GOAL #3   Title Pt will increase R grip dynomometer score by to 85# to demonstrate PLOF strength evidenced by uninvolved side needed for heavy household tasks.    Baseline 04/26/21 R: 25lbs; 07/24/21: 75lbs    Time 8    Period Weeks    Status On-going    Target Date 09/14/21      PT LONG TERM GOAL #4   Title Pt will demonstrate gross R shoulder and elbow strength of 5/5 in order to demonstrate PLOF strength for heavy household ADLs and return to sport    Baseline 04/26/21 no test performed; 07/24/21: generally 4/5 shoulder and 4+/5 elbow    Time 8    Period Weeks    Status On-going    Target Date 09/14/21                   Plan - 09/04/21 1207     Clinical Impression Statement PT continued therex  progression with continued introduction of plyometric strengthening with success. PT continued low repitition plyo and  strengthening with increased rest between sets to reduce post exercise soreness and fatigue. Patient is able to comply with all cuing for technique of therex with excellent motivaiton throughout session. PT will continue progression as able.    Personal Factors and Comorbidities Past/Current Experience;Age    Examination-Activity Limitations Carry;Lift;Bed Mobility;Sleep;Dressing;Reach Overhead;Hygiene/Grooming    Examination-Participation Restrictions Driving;Cleaning;Meal Prep;Yard Work;Community Activity;Laundry;Occupation    Stability/Clinical Decision Making Evolving/Moderate complexity    Clinical Decision Making Moderate    Rehab Potential Good    PT Frequency 2x / week    PT Duration 8 weeks    PT Treatment/Interventions ADLs/Self Care Home Management;Cryotherapy;Electrical Stimulation;Iontophoresis 4mg /ml Dexamethasone;Moist Heat;Traction;Ultrasound;Functional mobility training;Therapeutic activities;Neuromuscular re-education;Therapeutic exercise;Patient/family education;Manual techniques;Passive range of motion;Dry needling;Splinting;Scar mobilization;Spinal Manipulations;Joint Manipulations    PT Next Visit Plan ROM, strength, update HEP as needed    PT Home Exercise Plan Access Code: [email or web address] (updated 4/11); Access Code: [email or web address] (07/17/21); Access Code: [email or web address] (2nd HEP for "off days")    Consulted and Agree with Plan of Care Patient             Patient will benefit from skilled therapeutic intervention in order to improve the following deficits and impairments:  Decreased activity tolerance, Decreased endurance, Decreased range of motion, Decreased strength, Hypomobility, Increased fascial restricitons, Impaired UE functional use, Pain, Decreased coordination, Decreased mobility, Postural dysfunction  Visit Diagnosis: Chronic right shoulder pain     Problem  List Patient Active Problem List   Diagnosis Date Noted   Elevated LFTs 08/02/2021   Gastroesophageal reflux disease without esophagitis 08/02/2021   Wheezing 08/02/2021   Elevated bilirubin 06/21/2021   ETOH abuse 06/21/2021   Complete tear of right rotator cuff 03/22/2021   Right shoulder pain 01/30/2021   Primary hypertension 01/30/2021   Non-seasonal allergic rhinitis 02/14/2020   Chest pain, non-cardiac 01/07/2020   Pulmonary nodules 01/07/2020   Essential hypertension 12/09/2019   Elevated serum creatinine 11/27/2019   Elevated BP without diagnosis of hypertension 11/27/2019   Rectal bleeding 11/04/2019   Gastroesophageal reflux disease 11/04/2019   Chest pain 11/04/2019   SOB (shortness of breath) 11/04/2019   BMI 30.0-30.9,adult 11/04/2019   Encounter for HCV screening test for low risk patient 11/04/2019   Screening for HIV (human immunodeficiency virus) 11/04/2019   Encounter for screening and preventative care 11/04/2019   Tobacco chew use 11/04/2019   Durwin Reges DPT  Durwin Reges, PT 09/04/2021, 12:15 PM  [place] [facility] [address] [place], [address] Phone: [phone]   Fax:  [phone]  Name: Craig Finley MRN: [id number] Date of Birth: [birth year]

## 2021-09-11 ENCOUNTER — Ambulatory Visit: Payer: 59 | Admitting: Physical Therapy

## 2021-09-12 ENCOUNTER — Ambulatory Visit: Payer: 59

## 2021-09-12 ENCOUNTER — Encounter: Payer: Self-pay | Admitting: Physical Therapy

## 2021-09-12 DIAGNOSIS — M25511 Pain in right shoulder: Secondary | ICD-10-CM | POA: Diagnosis not present

## 2021-09-12 DIAGNOSIS — G8929 Other chronic pain: Secondary | ICD-10-CM

## 2021-09-12 NOTE — Therapy (Signed)
[place] [facility] [address] [place], [address] Phone: [phone]   Fax:  [phone]  Physical Therapy Treatment  Patient Details  Name: Craig Finley MRN: [id number] Date of Birth: 03-16-1961 Referring Provider (PT): Huel Cote   Encounter Date: 09/12/2021   PT End of Session - 09/12/21 1432     Visit Number 27    Number of Visits 41    Date for PT Re-Evaluation 09/14/21    Authorization Type [employer] Generic- VL of 30 per calendar year    Authorization Time Period 04/26/21-06/22/21, 3/31/223-09/14/21    Authorization - Visit Number 27    Authorization - Number of Visits 30    Progress Note Due on Visit 30    PT Start Time 1430    PT Stop Time 1510    PT Time Calculation (min) 40 min    Activity Tolerance Patient tolerated treatment well;No increased pain    Behavior During Therapy [facility] for tasks assessed/performed             Past Medical History:  Diagnosis Date   Allergy    Blood in stool    Chest pain    a. 11/2019 Cor CTA: Ca2 = 0. Nl cors.   CKD (chronic kidney disease) stage 2, GFR 60-89 ml/min    GERD (gastroesophageal reflux disease)    Heart murmur    as a child:   Hypertension    Pulmonary nodules    a. 11/2019 Cor CTA: Small L lung nodules - largest 74mm - incidental finding; b. 12/2020 Chest CT: Sm bilat pulm nodules - unchanged.    Past Surgical History:  Procedure Laterality Date   ANAL FISSURE REPAIR  05/1998   some type of anal fissure procedure   ARTHOSCOPIC ROTAOR CUFF REPAIR Right 04/23/2021   Procedure: Right ARTHROSCOPIC ROTATOR CUFF REPAIR;  Surgeon: Huel Cote, MD;  Location: [facility];  Service: Orthopedics;  Laterality: Right;   COLONOSCOPY WITH PROPOFOL N/A 01/31/2020   Procedure: COLONOSCOPY WITH PROPOFOL;  Surgeon: Toney Reil, MD;  Location: [facility] ENDOSCOPY;  Service: Gastroenterology;  Laterality: N/A;   ESOPHAGOGASTRODUODENOSCOPY (EGD) WITH  PROPOFOL N/A 01/31/2020   Procedure: ESOPHAGOGASTRODUODENOSCOPY (EGD) WITH PROPOFOL;  Surgeon: Toney Reil, MD;  Location: [facility] ENDOSCOPY;  Service: Gastroenterology;  Laterality: N/A;    There were no vitals filed for this visit.   Subjective Assessment - 09/12/21 1431     Subjective Pt reports no pain, some soreness from prior session.    Pertinent History Craig Finley is a 60yoM who is referred to OPPT for rehabilitzation s/p Rt shoulder arthroscopic supraspinatus repair.    Limitations Lifting;House hold activities    How long can you sit comfortably? unlimited    How long can you stand comfortably? unlimited    How long can you walk comfortably? unlimited    Diagnostic tests xray, unremarkable aside from age-typical arthrosis    Patient Stated Goals pnt would like to sleep comfortably and return to softball    Currently in Pain? No/denies    Pain Onset 1 to 4 weeks ago            There.ex: TRX flex and abd walkouts 2x12 each with good AAROM noted  Standing R shoulder ER with abduction to 90 deg: RTB, 3x8. PT demo prior to completion. Max TC's at elbow and shoulder girdle to maintain 90 deg abduction.   Push up from elevated mat (all the way up) to small  clap 3x 6 with cuing for equal push through BUE with good carry over and maintaining hips in neutral position.   Weighted ball catch and throw at rebounder, blue med ball, 2x15 with throw with R UE only, catch with LUE supporting  2 KG med ball tosses L shoulder facing rebounder with RUE tosses for R eccentric shoulder ER loading of RTC: 2x8. 2x10 with 3 KG ball.   Seated DB shoulder press with 7# DB. Spot provided by PT at wrist. X8.  Total gym pull up L18 1x6, 2x8. VC's for eccentric control.   Overhead 2 KG med ball taps on wall for rhythmic stabilization and plymometric loading to rotator cuff: 3x8    PT Education - 09/12/21 1432     Education Details form/technique with exercise.    Person(s) Educated  Patient    Methods Explanation;Demonstration    Comprehension Verbalized understanding;Returned demonstration              PT Short Term Goals - 06/12/21 1641       PT SHORT TERM GOAL #1   Title Pt will demonstrate independence with HEP to improve R shoulder function for increased ability to participate with ADLs.    Baseline 04/26/21 HEP given    Time 4    Period Weeks    Status Achieved    Target Date 05/24/21               PT Long Term Goals - 07/24/21 1402       PT LONG TERM GOAL #1   Title Pt will increase R shoulder abduction and flexion ROM to >170 degrees in order to complete functional ADL    Baseline 04/26/21 unable to assess; 06/12/21: 85-95 degrees; 07/24/21: F 149, ABD 141 (AROM)    Time 8    Period Weeks    Status On-going    Target Date 09/14/21      PT LONG TERM GOAL #2   Title Pt will be able to complete Apley scratch test on Rt shoulder and touch inferior border of left scapula to show clinically significant increase in IR to shower independently.    Baseline 04/26/21 unable to access; 3/21: ROM stil very limited, max effort to bring Rt thumb to central spine.; 07/24/21: limited - able to reach to anterior shoulder (horizontal adduction), T1 (ER), L2 (IR).    Time 8    Period Weeks    Status On-going    Target Date 09/14/21      PT LONG TERM GOAL #3   Title Pt will increase R grip dynomometer score by to 85# to demonstrate PLOF strength evidenced by uninvolved side needed for heavy household tasks.    Baseline 04/26/21 R: 25lbs; 07/24/21: 75lbs    Time 8    Period Weeks    Status On-going    Target Date 09/14/21      PT LONG TERM GOAL #4   Title Pt will demonstrate gross R shoulder and elbow strength of 5/5 in order to demonstrate PLOF strength for heavy household ADLs and return to sport    Baseline 04/26/21 no test performed; 07/24/21: generally 4/5 shoulder and 4+/5 elbow    Time 8    Period Weeks    Status On-going    Target Date 09/14/21                    Plan - 09/12/21 1513     Clinical Impression Statement Continuing PT POC with plyometric  tissue loading. Pt tolerating progression of exercise and total volume without any pain. Maintaining low repetition with each set of exercises for strength gain. Pt educated need for reassessment of goals next session as pt is nearing end of current POC. Pt remains with RLE weakness primarily with shoulder in abducted and overhead positions relying on TC's and gravity reduced positions for successful completion. PT to reassess pt's progress in upcoming session.    Personal Factors and Comorbidities Past/Current Experience;Age    Examination-Activity Limitations Carry;Lift;Bed Mobility;Sleep;Dressing;Reach Overhead;Hygiene/Grooming    Examination-Participation Restrictions Driving;Cleaning;Meal Prep;Yard Work;Community Activity;Laundry;Occupation    Stability/Clinical Decision Making Evolving/Moderate complexity    Rehab Potential Good    PT Frequency 2x / week    PT Duration 8 weeks    PT Treatment/Interventions ADLs/Self Care Home Management;Cryotherapy;Electrical Stimulation;Iontophoresis 4mg /ml Dexamethasone;Moist Heat;Traction;Ultrasound;Functional mobility training;Therapeutic activities;Neuromuscular re-education;Therapeutic exercise;Patient/family education;Manual techniques;Passive range of motion;Dry needling;Splinting;Scar mobilization;Spinal Manipulations;Joint Manipulations    PT Next Visit Plan RECERT or D/C    PT Home Exercise Plan Access Code: [email or web address] (updated 4/11); Access Code: 6/11 (07/17/21); Access Code: 07/19/21 (2nd HEP for "off days")    Consulted and Agree with Plan of Care Patient             Patient will benefit from skilled therapeutic intervention in order to improve the following deficits and impairments:  Decreased activity tolerance, Decreased endurance, Decreased range of motion, Decreased strength, Hypomobility, Increased fascial restricitons, Impaired UE  functional use, Pain, Decreased coordination, Decreased mobility, Postural dysfunction  Visit Diagnosis: Chronic right shoulder pain     Problem List Patient Active Problem List   Diagnosis Date Noted   Elevated LFTs 08/02/2021   Gastroesophageal reflux disease without esophagitis 08/02/2021   Wheezing 08/02/2021   Elevated bilirubin 06/21/2021   ETOH abuse 06/21/2021   Complete tear of right rotator cuff 03/22/2021   Right shoulder pain 01/30/2021   Primary hypertension 01/30/2021   Non-seasonal allergic rhinitis 02/14/2020   Chest pain, non-cardiac 01/07/2020   Pulmonary nodules 01/07/2020   Essential hypertension 12/09/2019   Elevated serum creatinine 11/27/2019   Elevated BP without diagnosis of hypertension 11/27/2019   Rectal bleeding 11/04/2019   Gastroesophageal reflux disease 11/04/2019   Chest pain 11/04/2019   SOB (shortness of breath) 11/04/2019   BMI 30.0-30.9,adult 11/04/2019   Encounter for HCV screening test for low risk patient 11/04/2019   Screening for HIV (human immunodeficiency virus) 11/04/2019   Encounter for screening and preventative care 11/04/2019   Tobacco chew use 11/04/2019    01/04/2020. Fairly IV, PT, DPT Physical Therapist- [place]  [facility]  09/12/2021, 3:18 PM  [place] [facility] [address], [place] Phone: [phone]   Fax:  [phone]  Name: Craig Finley MRN: [name] Date of Birth: 23-Dec-1960

## 2021-09-18 ENCOUNTER — Encounter: Payer: Self-pay | Admitting: Physical Therapy

## 2021-09-18 ENCOUNTER — Ambulatory Visit: Payer: 59 | Admitting: Physical Therapy

## 2021-09-18 DIAGNOSIS — M25511 Pain in right shoulder: Secondary | ICD-10-CM | POA: Diagnosis not present

## 2021-09-18 DIAGNOSIS — G8929 Other chronic pain: Secondary | ICD-10-CM

## 2021-09-18 NOTE — Therapy (Signed)
[place] [facility] [address] [place], [address] Phone: [phone]   Fax:  [phone]  Physical Therapy Treatment  Patient Details  Name: Craig Finley MRN: [id number] Date of Birth: [birth year] Referring Provider (PT): Huel Cote   Encounter Date: 09/18/2021   PT End of Session - 09/18/21 0959     Visit Number 28    Number of Visits 41    Date for PT Re-Evaluation 09/14/21    Authorization Type [employer] Generic- VL of 30 per calendar year    Authorization Time Period 04/26/21-06/22/21, 3/31/223-09/14/21    Authorization - Visit Number 28    Authorization - Number of Visits 30    Progress Note Due on Visit 30    PT Start Time 0918    PT Stop Time 0957    PT Time Calculation (min) 39 min    Activity Tolerance Patient tolerated treatment well;No increased pain             Past Medical History:  Diagnosis Date   Allergy    Blood in stool    Chest pain    a. 11/2019 Cor CTA: Ca2 = 0. Nl cors.   CKD (chronic kidney disease) stage 2, GFR 60-89 ml/min    GERD (gastroesophageal reflux disease)    Heart murmur    as a child:   Hypertension    Pulmonary nodules    a. 11/2019 Cor CTA: Small L lung nodules - largest 4mm - incidental finding; b. 12/2020 Chest CT: Sm bilat pulm nodules - unchanged.    Past Surgical History:  Procedure Laterality Date   ANAL FISSURE REPAIR  05/1998   some type of anal fissure procedure   ARTHOSCOPIC ROTAOR CUFF REPAIR Right 04/23/2021   Procedure: Right ARTHROSCOPIC ROTATOR CUFF REPAIR;  Surgeon: Huel Cote, MD;  Location: [facility] OR;  Service: Orthopedics;  Laterality: Right;   COLONOSCOPY WITH PROPOFOL N/A 01/31/2020   Procedure: COLONOSCOPY WITH PROPOFOL;  Surgeon: Toney Reil, MD;  Location: [facility] ENDOSCOPY;  Service: Gastroenterology;  Laterality: N/A;   ESOPHAGOGASTRODUODENOSCOPY (EGD) WITH PROPOFOL N/A 01/31/2020   Procedure: ESOPHAGOGASTRODUODENOSCOPY  (EGD) WITH PROPOFOL;  Surgeon: Toney Reil, MD;  Location: [facility] ENDOSCOPY;  Service: Gastroenterology;  Laterality: N/A;    There were no vitals filed for this visit.   Subjective Assessment - 09/18/21 0922     Subjective Reports he has not had any pain since last visit, has not done his strengtening exercises since last visit and has started to have some painless popping at the ant shoulder with aarom abd and with pulling. This is localised to bicep tendon with trigger points and tension at bicep muscle belly.    Pertinent History Craig Finley is a 60yoM who is referred to OPPT for rehabilitzation s/p Rt shoulder arthroscopic supraspinatus repair.    Limitations Lifting;House hold activities    How long can you sit comfortably? unlimited    How long can you stand comfortably? unlimited    How long can you walk comfortably? unlimited    Diagnostic tests xray, unremarkable aside from age-typical arthrosis    Patient Stated Goals pnt would like to sleep comfortably and return to softball    Pain Onset 1 to 4 weeks ago                 There.ex: TRX flex walkouts x12 each with good AAROM noted; attempted abd walkouts with painless popping at bicep tendon 50% of the time  Alt lateral push up from elevated mat (all the way up) 2x 10 (5 each side) min range with good carry over of demo    Overhead 2 KG med ball taps on wall for rhythmic stabilization and plymometric loading to rotator cuff 2x 15  DB clean + snatch 5# x6; 7# 2 x6 with good carry over following demo and max cuing  Pec doorway and bicep doorway stretch x30sec each with education on completing post needling  TRX flex abd walkouts x12 each with good AAROM noted- completed at end of session with no popping sensation   Manual STM with trigger point release to R bicep and R pec minor  Following: Dry Needling: (2/1) 30mm .25 needles placed along the R Biceps and R pec minor with pincer (pull away for pec minor) to  decrease increased muscular spasms and trigger points with the patient positioned in supine. Patient was educated on risks and benefits of therapy and verbally consents to PT.                            PT Education - 09/18/21 0958     Education Details therex form/technique    Person(s) Educated Patient    Methods Explanation;Demonstration;Verbal cues    Comprehension Verbalized understanding;Returned demonstration;Verbal cues required              PT Short Term Goals - 06/12/21 1641       PT SHORT TERM GOAL #1   Title Pt will demonstrate independence with HEP to improve R shoulder function for increased ability to participate with ADLs.    Baseline 04/26/21 HEP given    Time 4    Period Weeks    Status Achieved    Target Date 05/24/21               PT Long Term Goals - 07/24/21 1402       PT LONG TERM GOAL #1   Title Pt will increase R shoulder abduction and flexion ROM to >170 degrees in order to complete functional ADL    Baseline 04/26/21 unable to assess; 06/12/21: 85-95 degrees; 07/24/21: F 149, ABD 141 (AROM)    Time 8    Period Weeks    Status On-going    Target Date 09/14/21      PT LONG TERM GOAL #2   Title Pt will be able to complete Apley scratch test on Rt shoulder and touch inferior border of left scapula to show clinically significant increase in IR to shower independently.    Baseline 04/26/21 unable to access; 3/21: ROM stil very limited, max effort to bring Rt thumb to central spine.; 07/24/21: limited - able to reach to anterior shoulder (horizontal adduction), T1 (ER), L2 (IR).    Time 8    Period Weeks    Status On-going    Target Date 09/14/21      PT LONG TERM GOAL #3   Title Pt will increase R grip dynomometer score by to 85# to demonstrate PLOF strength evidenced by uninvolved side needed for heavy household tasks.    Baseline 04/26/21 R: 25lbs; 07/24/21: 75lbs    Time 8    Period Weeks    Status On-going    Target Date  09/14/21      PT LONG TERM GOAL #4   Title Pt will demonstrate gross R shoulder and elbow strength of 5/5 in order to demonstrate PLOF strength for heavy household  ADLs and return to sport    Baseline 04/26/21 no test performed; 07/24/21: generally 4/5 shoulder and 4+/5 elbow    Time 8    Period Weeks    Status On-going    Target Date 09/14/21                   Plan - 09/18/21 1010     Clinical Impression Statement Pt endorses some painless popping localized to bicep proximal tendon insertion at start of session. paitent with trigger points and tension to bicep muscle belly and pec minor, resolves with manual techniques with TDN. Following manual techniques no popping sensation with lateral abd walks (provocating prior). PT continued therex progression for increased strenghtening of RTC and periscapular musculature, and scapulohumeral rhythm with success. PT will continue progression as able.    Personal Factors and Comorbidities Past/Current Experience;Age    Examination-Activity Limitations Carry;Lift;Bed Mobility;Sleep;Dressing;Reach Overhead;Hygiene/Grooming    Examination-Participation Restrictions Driving;Cleaning;Meal Prep;Yard Work;Community Activity;Laundry;Occupation    Stability/Clinical Decision Making Evolving/Moderate complexity    Clinical Decision Making Moderate    Rehab Potential Good    PT Frequency 2x / week    PT Duration 8 weeks    PT Treatment/Interventions ADLs/Self Care Home Management;Cryotherapy;Electrical Stimulation;Iontophoresis 4mg /ml Dexamethasone;Moist Heat;Traction;Ultrasound;Functional mobility training;Therapeutic activities;Neuromuscular re-education;Therapeutic exercise;Patient/family education;Manual techniques;Passive range of motion;Dry needling;Splinting;Scar mobilization;Spinal Manipulations;Joint Manipulations    PT Next Visit Plan RECERT or D/C    PT Home Exercise Plan Access Code: [email or web address] (updated 4/11); Access Code: [email or web address] (07/17/21);  Access Code: [email or web address] (2nd HEP for "off days")    Consulted and Agree with Plan of Care Patient             Patient will benefit from skilled therapeutic intervention in order to improve the following deficits and impairments:  Decreased activity tolerance, Decreased endurance, Decreased range of motion, Decreased strength, Hypomobility, Increased fascial restricitons, Impaired UE functional use, Pain, Decreased coordination, Decreased mobility, Postural dysfunction  Visit Diagnosis: Chronic right shoulder pain     Problem List Patient Active Problem List   Diagnosis Date Noted   Elevated LFTs 08/02/2021   Gastroesophageal reflux disease without esophagitis 08/02/2021   Wheezing 08/02/2021   Elevated bilirubin 06/21/2021   ETOH abuse 06/21/2021   Complete tear of right rotator cuff 03/22/2021   Right shoulder pain 01/30/2021   Primary hypertension 01/30/2021   Non-seasonal allergic rhinitis 02/14/2020   Chest pain, non-cardiac 01/07/2020   Pulmonary nodules 01/07/2020   Essential hypertension 12/09/2019   Elevated serum creatinine 11/27/2019   Elevated BP without diagnosis of hypertension 11/27/2019   Rectal bleeding 11/04/2019   Gastroesophageal reflux disease 11/04/2019   Chest pain 11/04/2019   SOB (shortness of breath) 11/04/2019   BMI 30.0-30.9,adult 11/04/2019   Encounter for HCV screening test for low risk patient 11/04/2019   Screening for HIV (human immunodeficiency virus) 11/04/2019   Encounter for screening and preventative care 11/04/2019   Tobacco chew use 11/04/2019   Hilda Lias DPT Hilda Lias, PT 09/18/2021, 11:44 AM  [place] [facility] [address] [place], [address] Phone: [phone]   Fax:  [phone]  Name: Craig Finley MRN: [id number] Date of Birth: [birth year]

## 2021-09-27 ENCOUNTER — Encounter: Payer: 59 | Admitting: Physical Therapy

## 2021-10-02 ENCOUNTER — Ambulatory Visit: Payer: 59 | Attending: Internal Medicine | Admitting: Physical Therapy

## 2021-10-02 ENCOUNTER — Encounter: Payer: Self-pay | Admitting: Physical Therapy

## 2021-10-02 DIAGNOSIS — G8929 Other chronic pain: Secondary | ICD-10-CM | POA: Insufficient documentation

## 2021-10-02 DIAGNOSIS — M25511 Pain in right shoulder: Secondary | ICD-10-CM | POA: Diagnosis not present

## 2021-10-02 NOTE — Therapy (Signed)
[facility] [address], [place], [address] Phone: [phone]   Fax:  [phone]  Physical Therapy Treatment  Patient Details  Name: Craig Finley MRN: [id number] Date of Birth: 1960/08/30 Referring Provider (PT): Huel Cote   Encounter Date: 10/02/2021    Past Medical History:  Diagnosis Date   Allergy    Blood in stool    Chest pain    a. 11/2019 Cor CTA: Ca2 = 0. Nl cors.   CKD (chronic kidney disease) stage 2, GFR 60-89 ml/min    GERD (gastroesophageal reflux disease)    Heart murmur    as a child:   Hypertension    Pulmonary nodules    a. 11/2019 Cor CTA: Small L lung nodules - largest 87mm - incidental finding; b. 12/2020 Chest CT: Sm bilat pulm nodules - unchanged.    Past Surgical History:  Procedure Laterality Date   ANAL FISSURE REPAIR  05/1998   some type of anal fissure procedure   ARTHOSCOPIC ROTAOR CUFF REPAIR Right 04/23/2021   Procedure: Right ARTHROSCOPIC ROTATOR CUFF REPAIR;  Surgeon: Huel Cote, MD;  Location: [facility] OR;  Service: Orthopedics;  Laterality: Right;   COLONOSCOPY WITH PROPOFOL N/A 01/31/2020   Procedure: COLONOSCOPY WITH PROPOFOL;  Surgeon: Toney Reil, MD;  Location: [facility] ENDOSCOPY;  Service: Gastroenterology;  Laterality: N/A;   ESOPHAGOGASTRODUODENOSCOPY (EGD) WITH PROPOFOL N/A 01/31/2020   Procedure: ESOPHAGOGASTRODUODENOSCOPY (EGD) WITH PROPOFOL;  Surgeon: Toney Reil, MD;  Location: [facility] ENDOSCOPY;  Service: Gastroenterology;  Laterality: N/A;    There were no vitals filed for this visit.    There.ex: TRX flex and abd walkouts x12 each with good AAROM noted;    D1 flex 2x 6 4# DB (attempted 6#DB unable) with good carry over of demo   Overhead 2 KG med ball taps on wall for rhythmic stabilization and plymometric loading to rotator cuff 2x 15   ER at 90/90 RTB 2x 10 with min cuing to maintain upper arm position with good carry  over   Plyo push up 50% from max elevated mat table 2x 6  Pec doorway and bicep doorway stretch x30sec each with education on completing post needling    Manual Following: Dry Needling: (2/1) 66mm .25 needles placed along the R Biceps and R pec minor with pincer (pull away for pec minor) to decrease increased muscular spasms and trigger points with the patient positioned in supine. Patient was educated on risks and benefits of therapy and verbally consents to PT.                               PT Short Term Goals - 06/12/21 1641       PT SHORT TERM GOAL #1   Title Pt will demonstrate independence with HEP to improve R shoulder function for increased ability to participate with ADLs.    Baseline 04/26/21 HEP given    Time 4    Period Weeks    Status Achieved    Target Date 05/24/21               PT Long Term Goals - 07/24/21 1402       PT LONG TERM GOAL #1   Title Pt will increase R shoulder abduction and flexion ROM to >170 degrees in order to complete functional ADL    Baseline 04/26/21 unable to assess; 06/12/21: 85-95 degrees; 07/24/21: F 149,  ABD 141 (AROM)    Time 8    Period Weeks    Status On-going    Target Date 09/14/21      PT LONG TERM GOAL #2   Title Pt will be able to complete Apley scratch test on Rt shoulder and touch inferior border of left scapula to show clinically significant increase in IR to shower independently.    Baseline 04/26/21 unable to access; 3/21: ROM stil very limited, max effort to bring Rt thumb to central spine.; 07/24/21: limited - able to reach to anterior shoulder (horizontal adduction), T1 (ER), L2 (IR).    Time 8    Period Weeks    Status On-going    Target Date 09/14/21      PT LONG TERM GOAL #3   Title Pt will increase R grip dynomometer score by to 85# to demonstrate PLOF strength evidenced by uninvolved side needed for heavy household tasks.    Baseline 04/26/21 R: 25lbs; 07/24/21: 75lbs    Time 8    Period Weeks     Status On-going    Target Date 09/14/21      PT LONG TERM GOAL #4   Title Pt will demonstrate gross R shoulder and elbow strength of 5/5 in order to demonstrate PLOF strength for heavy household ADLs and return to sport    Baseline 04/26/21 no test performed; 07/24/21: generally 4/5 shoulder and 4+/5 elbow    Time 8    Period Weeks    Status On-going    Target Date 09/14/21                    Patient will benefit from skilled therapeutic intervention in order to improve the following deficits and impairments:     Visit Diagnosis: No diagnosis found.     Problem List Patient Active Problem List   Diagnosis Date Noted   Elevated LFTs 08/02/2021   Gastroesophageal reflux disease without esophagitis 08/02/2021   Wheezing 08/02/2021   Elevated bilirubin 06/21/2021   ETOH abuse 06/21/2021   Complete tear of right rotator cuff 03/22/2021   Right shoulder pain 01/30/2021   Primary hypertension 01/30/2021   Non-seasonal allergic rhinitis 02/14/2020   Chest pain, non-cardiac 01/07/2020   Pulmonary nodules 01/07/2020   Essential hypertension 12/09/2019   Elevated serum creatinine 11/27/2019   Elevated BP without diagnosis of hypertension 11/27/2019   Rectal bleeding 11/04/2019   Gastroesophageal reflux disease 11/04/2019   Chest pain 11/04/2019   SOB (shortness of breath) 11/04/2019   BMI 30.0-30.9,adult 11/04/2019   Encounter for HCV screening test for low risk patient 11/04/2019   Screening for HIV (human immunodeficiency virus) 11/04/2019   Encounter for screening and preventative care 11/04/2019   Tobacco chew use 11/04/2019    Hilda Lias, PT 10/02/2021, 10:54 AM  [place] [facility] [address] [place], [address] Phone: [phone]   Fax:  [phone]  Name: Craig Finley MRN: [id number] Date of Birth: Jul 13, 1960

## 2021-10-03 NOTE — Addendum Note (Signed)
Addended by: Lawrence Marseilles on: 10/03/2021 09:53 AM   Modules accepted: Orders

## 2021-10-09 ENCOUNTER — Encounter: Payer: 59 | Admitting: Physical Therapy

## 2021-10-16 ENCOUNTER — Encounter: Payer: Self-pay | Admitting: Physical Therapy

## 2021-10-16 ENCOUNTER — Ambulatory Visit: Payer: 59 | Admitting: Physical Therapy

## 2021-10-16 DIAGNOSIS — G8929 Other chronic pain: Secondary | ICD-10-CM

## 2021-10-16 DIAGNOSIS — M25511 Pain in right shoulder: Secondary | ICD-10-CM | POA: Diagnosis not present

## 2021-10-16 NOTE — Therapy (Signed)
OUTPATIENT PHYSICAL THERAPY TREATMENT NOTE/DISCHARGE SUMMARY REPORTING PERIOD 06/12/21 - 10/16/21   Patient Name: Craig Finley MRN: [id number] DOB:09/03/1960, 61 y.o., male Today's Date: 10/16/2021  PCP: Charlynne Cousins MD REFERRING PROVIDER: Vanetta Mulders MD  PT End of Session - 10/16/21 1309     Visit Number 30    Number of Visits 41    Date for PT Re-Evaluation 11/23/21    Authorization Type [employer] Generic- VL of 30 per calendar year    Authorization Time Period 04/26/21-06/22/21, 3/31/223-09/14/21    Authorization - Visit Number 41    Authorization - Number of Visits 30    Progress Note Due on Visit 30    PT Start Time 4098    PT Stop Time 1345    PT Time Calculation (min) 40 min    Activity Tolerance Patient tolerated treatment well;No increased pain    Behavior During Therapy [facility] for tasks assessed/performed             Past Medical History:  Diagnosis Date   Allergy    Blood in stool    Chest pain    a. 11/2019 Cor CTA: Ca2 = 0. Nl cors.   CKD (chronic kidney disease) stage 2, GFR 60-89 ml/min    GERD (gastroesophageal reflux disease)    Heart murmur    as a child:   Hypertension    Pulmonary nodules    a. 11/2019 Cor CTA: Small L lung nodules - largest 8m - incidental finding; b. 12/2020 Chest CT: Sm bilat pulm nodules - unchanged.   Past Surgical History:  Procedure Laterality Date   ANAL FISSURE REPAIR  05/1998   some type of anal fissure procedure   ARTHOSCOPIC ROTAOR CUFF REPAIR Right 04/23/2021   Procedure: Right ARTHROSCOPIC ROTATOR CUFF REPAIR;  Surgeon: BVanetta Mulders MD;  [facility]  Service: Orthopedics;  Laterality: Right;   COLONOSCOPY WITH PROPOFOL N/A 01/31/2020   Procedure: COLONOSCOPY WITH PROPOFOL;  Surgeon: VLin Landsman MD;  [facility]  Service: Gastroenterology;  Laterality: N/A;   ESOPHAGOGASTRODUODENOSCOPY (EGD) WITH PROPOFOL N/A 01/31/2020   Procedure: ESOPHAGOGASTRODUODENOSCOPY (EGD) WITH PROPOFOL;   Surgeon: VLin Landsman MD;  [facility]  Service: Gastroenterology;  Laterality: N/A;   Patient Active Problem List   Diagnosis Date Noted   Elevated LFTs 08/02/2021   Gastroesophageal reflux disease without esophagitis 08/02/2021   Wheezing 08/02/2021   Elevated bilirubin 06/21/2021   ETOH abuse 06/21/2021   Complete tear of right rotator cuff 03/22/2021   Right shoulder pain 01/30/2021   Primary hypertension 01/30/2021   Non-seasonal allergic rhinitis 02/14/2020   Chest pain, non-cardiac 01/07/2020   Pulmonary nodules 01/07/2020   Essential hypertension 12/09/2019   Elevated serum creatinine 11/27/2019   Elevated BP without diagnosis of hypertension 11/27/2019   Rectal bleeding 11/04/2019   Gastroesophageal reflux disease 11/04/2019   Chest pain 11/04/2019   SOB (shortness of breath) 11/04/2019   BMI 30.0-30.9,adult 11/04/2019   Encounter for HCV screening test for low risk patient 11/04/2019   Screening for HIV (human immunodeficiency virus) 11/04/2019   Encounter for screening and preventative care 11/04/2019   Tobacco chew use 11/04/2019    REFERRING DIAG: R rotator repair  THERAPY DIAG:  Chronic right shoulder pain  Rationale for Evaluation and Treatment Rehabilitation  PERTINENT HISTORY:   PRECAUTIONS: none  SUBJECTIVE: Pt reports minimal pain today. Feels alittle stiff from not completing HEP over the past week. Has some painless popping with overhead pulleys  PAIN:  Are  you having pain? Yes: NPRS scale: 3/10 Pain location: R shoulder     TODAY'S TREATMENT:  There.ex: PT reviewed the following HEP with patient with patient able to demonstrate a set of the following with min cuing for correction needed. PT educated patient on parameters of therex (how/when to inc/decrease intensity, frequency, rep/set range, stretch hold time, and purpose of therex) with verbalized understanding.   Access Code: [email or web address] - Shoulder PNF D2 Flexion  - 7 x  weekly - 3 sets - 6-12 reps - Plyometric Pushups with Clap  - 2 x weekly - 3 sets - 6-12 reps - Full Plank with Shoulder Taps  - 2 x weekly - 3 sets - 12-24 reps - Standing Single Arm Shoulder External Rotation in Abduction with Anchored Resistance  - 2 x weekly - 3 sets - 6-12 reps - Standing Shoulder Internal Rotation AAROM with Pulley  - 1-2 x daily - 7 x weekly - 12-20 reps - 2sec hold - Doorway Rhomboid Stretch  - 1-3 x daily - 7 x weekly - 30sec hold - Standing Bicep Stretch at Wall  - 1-3 x daily - 7 x weekly - 30sec hold   PATIENT EDUCATION: Education details:PT reviewed the following HEP with patient with patient able to demonstrate a set of the following with min cuing for correction needed. PT educated patient on parameters of therex (how/when to inc/decrease intensity, frequency, rep/set range, stretch hold time, and purpose of therex) with verbalized understanding.    Person educated: Patient Education method: Explanation, Demonstration, and Tactile cues Education comprehension: verbalized understanding, returned demonstration, and verbal cues required   HOME EXERCISE PROGRAM: [email or web address]   PT Short Term Goals - 10/16/21 1313       PT SHORT TERM GOAL #1   Title Pt will demonstrate independence with HEP to improve R shoulder function for increased ability to participate with ADLs.    Baseline 04/26/21 HEP given    Time 4    Period Weeks    Status Achieved    Target Date 05/24/21              PT Long Term Goals - 10/16/21 1313       PT LONG TERM GOAL #1   Title Pt will increase R shoulder abduction and flexion ROM to >170 degrees in order to complete functional ADL    Baseline 04/26/21 unable to assess; 06/12/21: 85-95 degrees; 07/24/21: F 149, ABD 141 (AROM); 10/16/21: flex: 170 abd 176    Time 8    Period Weeks    Status Achieved    Target Date 09/14/21      PT LONG TERM GOAL #2   Title Pt will be able to complete Apley scratch test on Rt shoulder and touch  inferior border of left scapula to show clinically significant increase in IR to shower independently.    Baseline 04/26/21 unable to access; 3/21: ROM stil very limited, max effort to bring Rt thumb to central spine.; 07/24/21: limited - able to reach to anterior shoulder (horizontal adduction), T1 (ER), L2 (IR).: 10/16/21 ER CTJ IR T12    Time 8    Period Weeks    Status Partially Met    Target Date 09/14/21      PT LONG TERM GOAL #3   Title Pt will increase R grip dynomometer score by to 85# to demonstrate PLOF strength evidenced by uninvolved side needed for heavy household tasks.    Baseline 04/26/21 R: 25lbs; 07/24/21: 75lbs; 10/16/21 88#  Time 8    Period Weeks    Status Achieved    Target Date 09/14/21      PT LONG TERM GOAL #4   Title Pt will demonstrate gross R shoulder and elbow strength of 5/5 in order to demonstrate PLOF strength for heavy household ADLs and return to sport    Baseline 04/26/21 no test performed; 07/24/21: generally 4/5 shoulder and 4+/5 elbow; 10/16/21 groxx 5/5 strength    Time 8    Period Weeks    Status Achieved    Target Date 09/14/21              Plan - 10/16/21 1350     Clinical Impression Statement PT reassessed patient goals this session where pt has met all goals, other than apleys IR ROM. Patient has reached max amount of PT visits for insurance allowance, thankfully he has met all his goals to continue progression for high level strengthening and plyometric strengthening with HEP. Paitent is able to demonstrate and verbalize understanding of all HEP recommendations. Pt to d/c PT with printout of HEP instructions.    Personal Factors and Comorbidities Past/Current Experience;Age    Examination-Activity Limitations Carry;Lift;Bed Mobility;Sleep;Dressing;Reach Overhead;Hygiene/Grooming    Examination-Participation Restrictions Driving;Cleaning;Meal Prep;Yard Work;Community Activity;Laundry;Occupation    Stability/Clinical Decision Making Evolving/Moderate  complexity    Clinical Decision Making Moderate    Rehab Potential Good    PT Frequency 2x / week    PT Duration 8 weeks    PT Treatment/Interventions ADLs/Self Care Home Management;Cryotherapy;Electrical Stimulation;Iontophoresis 57m/ml Dexamethasone;Moist Heat;Traction;Ultrasound;Functional mobility training;Therapeutic activities;Neuromuscular re-education;Therapeutic exercise;Patient/family education;Manual techniques;Passive range of motion;Dry needling;Splinting;Scar mobilization;Spinal Manipulations;Joint Manipulations    PT Home Exercise Plan DC HEP [email or web address]    Consulted and Agree with Plan of Care Patient              CDurwin RegesDPT CDurwin Reges PT 10/16/2021, 2:25 PM

## 2021-11-08 ENCOUNTER — Ambulatory Visit (INDEPENDENT_AMBULATORY_CARE_PROVIDER_SITE_OTHER): Payer: 59 | Admitting: Orthopaedic Surgery

## 2021-11-08 DIAGNOSIS — S46011A Strain of muscle(s) and tendon(s) of the rotator cuff of right shoulder, initial encounter: Secondary | ICD-10-CM

## 2021-11-08 DIAGNOSIS — M25511 Pain in right shoulder: Secondary | ICD-10-CM | POA: Diagnosis not present

## 2021-11-08 MED ORDER — LIDOCAINE HCL 1 % IJ SOLN
4.0000 mL | INTRAMUSCULAR | Status: AC | PRN
  Administered 2021-11-08: 4 mL

## 2021-11-08 MED ORDER — TRIAMCINOLONE ACETONIDE 40 MG/ML IJ SUSP
80.0000 mg | INTRAMUSCULAR | Status: AC | PRN
  Administered 2021-11-08: 80 mg via INTRA_ARTICULAR

## 2021-11-08 NOTE — Progress Notes (Signed)
Post Operative Evaluation    Procedure/Date of Surgery: Right shoulder rotator cuff repair done on April 23, 2021  For follow-up status post right shoulder rotator cuff repair.  He is improving slowly although he is not really wants to be.  He is having some weakness with overhead activity.  Here today for further assessment.  States that he is having a difficult time working through right shoulder strengthening as he is back to work at this time.  PMH/PSH/Family History/Social History/Meds/Allergies:    Past Medical History:  Diagnosis Date  . Allergy   . Blood in stool   . Chest pain    a. 11/2019 Cor CTA: Ca2 = 0. Nl cors.  . CKD (chronic kidney disease) stage 2, GFR 60-89 ml/min   . GERD (gastroesophageal reflux disease)   . Heart murmur    as a child:  . Hypertension   . Pulmonary nodules    a. 11/2019 Cor CTA: Small L lung nodules - largest 31mm - incidental finding; b. 12/2020 Chest CT: Sm bilat pulm nodules - unchanged.   Past Surgical History:  Procedure Laterality Date  . ANAL FISSURE REPAIR  05/1998   some type of anal fissure procedure  . ARTHOSCOPIC ROTAOR CUFF REPAIR Right 04/23/2021   Procedure: Right ARTHROSCOPIC ROTATOR CUFF REPAIR;  Surgeon: Huel Cote, MD;  Location: [facility] OR;  Service: Orthopedics;  Laterality: Right;  . COLONOSCOPY WITH PROPOFOL N/A 01/31/2020   Procedure: COLONOSCOPY WITH PROPOFOL;  Surgeon: Toney Reil, MD;  Location: [facility] ENDOSCOPY;  Service: Gastroenterology;  Laterality: N/A;  . ESOPHAGOGASTRODUODENOSCOPY (EGD) WITH PROPOFOL N/A 01/31/2020   Procedure: ESOPHAGOGASTRODUODENOSCOPY (EGD) WITH PROPOFOL;  Surgeon: Toney Reil, MD;  Location: [facility] ENDOSCOPY;  Service: Gastroenterology;  Laterality: N/A;   Social History   Socioeconomic History  . Marital status: Married    Spouse name: Not on file  . Number of children: Not on file  . Years of education: Not on file  . Highest education  level: Some college, no degree  Occupational History  . Occupation: trucker  Tobacco Use  . Smoking status: Never  . Smokeless tobacco: Former    Types: Snuff    Quit date: 04/02/2021  . Tobacco comments:    dipping x 25-30 years  Vaping Use  . Vaping Use: Never used  Substance and Sexual Activity  . Alcohol use: Yes    Comment: drinks 4-7 beers some days and none others. He drinks 24 12 oz can  beers per   . Drug use: Never  . Sexual activity: Yes  Other Topics Concern  . 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 Arthritis Mother   . Asthma Mother   . Hypertension Mother   . Hearing loss Father   . Alcohol abuse Sister   . Cancer Sister   . Depression Sister   . Drug abuse Sister   . Early death Sister   . Heart attack  Sister   . Hypertension Sister   . Miscarriages / Stillbirths Sister   . Asthma Sister   . Depression Sister   . Heart disease Maternal Uncle   . Heart disease Maternal Uncle   . Alcohol abuse Maternal Grandmother   . Cancer Maternal Grandmother   . Cancer Maternal Grandfather   . Asthma Daughter   . Asthma Son    No Known Allergies Current Outpatient Medications  Medication Sig Dispense Refill  . acetaminophen (TYLENOL) 500 MG tablet Take 1,000 mg by mouth every 8 (eight) hours as needed for moderate pain.    Marland Kitchen albuterol (VENTOLIN HFA) 108 (90 Base) MCG/ACT inhaler Inhale 2 puffs into the lungs every 6 (six) hours as needed for wheezing or shortness of breath. 8 g 2  . amLODipine (NORVASC) 5 MG tablet Take 1 tablet (5 mg total) by mouth daily. 90 tablet 3  . fexofenadine (ALLEGRA ALLERGY) 180 MG tablet Take 1 tablet (180  mg total) by mouth daily. 10 tablet 1  . Fexofenadine-Pseudoephedrine (ALLEGRA-D PO) Take 1 tablet by mouth daily as needed (allergies).    Marland Kitchen omeprazole (PRILOSEC) 20 MG capsule Take 1 capsule (20 mg total) by mouth daily. 30 capsule 3  . triamcinolone (NASACORT) 55 MCG/ACT AERO nasal inhaler Place 2 sprays into the nose daily. (Patient taking differently: Place 2 sprays into the nose daily as needed (allergies).) 1 each 4   No current facility-administered medications for this visit.   No results found.  Review of Systems:   A ROS was performed including pertinent positives and negatives as documented in the HPI.   Musculoskeletal Exam:    There were no vitals taken for this visit.  Incisions are healed. no erythema or drainage.  Active forward elevation is to 150 degrees.  External rotation at the side is to 50 sensation intact all distributions including axillary distribution 2+ radial pulse.  Internal rotation is to L1  Imaging:    None  I personally reviewed and interpreted the radiographs.   Assessment:   61 year old male status post right shoulder rotator cuff repair.  Continues to make slow progress.  Today he was getting some impingement type symptoms with scapular winging and flat affect I recommended ultrasound-guided injection of the subacromial space to hopefully allow for better overhead activity.  I will plan to see him back in 3 months for final assessment Plan :    -Return to clinic in 12 weeks     Procedure Note  Patient: Craig Finley             Date of Birth: 1960-08-16           MRN: [id number]             Visit Date: 11/08/2021  Procedures: Visit Diagnoses: No diagnosis found.  Large Joint Inj: R subacromial bursa on 11/08/2021 11:33 AM Indications: pain Details: 22 G 1.5 in needle, ultrasound-guided anterior approach  Arthrogram: No  Medications: 4 mL lidocaine 1 %; 80 mg triamcinolone acetonide 40 MG/ML Outcome: tolerated well, no  immediate complications Procedure, treatment alternatives, risks and benefits explained, specific risks discussed. Consent was given by the patient. Immediately prior to procedure a time out was called to verify the correct patient, procedure, equipment, support staff and site/side marked as required. Patient was prepped and draped in the usual sterile fashion.       I personally saw and evaluated the patient, and participated in the management and treatment plan.  Huel Cote, MD  Attending Physician, Orthopedic Surgery  This document was dictated using Dragon voice recognition software. A reasonable attempt at proof reading has been made to minimize errors.

## 2021-11-09 ENCOUNTER — Encounter: Payer: Self-pay | Admitting: Internal Medicine

## 2021-11-15 ENCOUNTER — Ambulatory Visit (HOSPITAL_BASED_OUTPATIENT_CLINIC_OR_DEPARTMENT_OTHER): Payer: 59 | Admitting: Orthopaedic Surgery

## 2022-02-05 ENCOUNTER — Ambulatory Visit: Payer: 59 | Admitting: Unknown Physician Specialty

## 2022-02-07 ENCOUNTER — Ambulatory Visit (INDEPENDENT_AMBULATORY_CARE_PROVIDER_SITE_OTHER): Payer: 59 | Admitting: Orthopaedic Surgery

## 2022-02-07 DIAGNOSIS — S46011A Strain of muscle(s) and tendon(s) of the rotator cuff of right shoulder, initial encounter: Secondary | ICD-10-CM

## 2022-02-07 NOTE — Progress Notes (Signed)
Post Operative Evaluation    Procedure/Date of Surgery: Right shoulder rotator cuff repair done on April 23, 2021  Presents today 9 and half months for follow-up after the above procedure.  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  He feels like his range of motion is better.  There is some soreness and stiffness although this is overall mild.  He has not returned to baseball at this point.   PMH/PSH/Family History/Social History/Meds/Allergies:    Past Medical History:  Diagnosis Date   Allergy    Blood in stool    Chest pain    a. 11/2019 Cor CTA: Ca2 = 0. Nl cors.   CKD (chronic kidney disease) stage 2, GFR 60-89 ml/min    GERD (gastroesophageal reflux disease)    Heart murmur    as a child:   Hypertension    Pulmonary nodules    a. 11/2019 Cor CTA: Small L lung nodules - largest 19mm - incidental finding; b. 12/2020 Chest CT: Sm bilat pulm nodules - unchanged.   Past Surgical History:  Procedure Laterality Date   ANAL FISSURE REPAIR  05/1998   some type of anal fissure procedure   ARTHOSCOPIC ROTAOR CUFF REPAIR Right 04/23/2021   Procedure: Right ARTHROSCOPIC ROTATOR CUFF REPAIR;  Surgeon: Vanetta Mulders, MD;  Location: [place];  Service: Orthopedics;  Laterality: Right;   COLONOSCOPY WITH PROPOFOL N/A 01/31/2020   Procedure: COLONOSCOPY WITH PROPOFOL;  Surgeon: Lin Landsman, MD;  Location: [facility] ENDOSCOPY;  Service: Gastroenterology;  Laterality: N/A;   ESOPHAGOGASTRODUODENOSCOPY (EGD) WITH PROPOFOL N/A 01/31/2020   Procedure: ESOPHAGOGASTRODUODENOSCOPY (EGD) WITH PROPOFOL;  Surgeon: Lin Landsman, MD;  Location: [facility] ENDOSCOPY;  Service: Gastroenterology;  Laterality: N/A;   Social History   Socioeconomic History   Marital status: Married    Spouse name: Not on file   Number of children: Not on file   Years of education: Not on file   Highest education level: Some college, no degree  Occupational  History   Occupation: trucker  Tobacco Use   Smoking status: Never   Smokeless tobacco: Former    Types: Snuff    Quit date: 04/02/2021   Tobacco comments:    dipping x 25-30 years  Vaping Use   Vaping Use: Never used  Substance and Sexual Activity   Alcohol use: Yes    Comment: drinks 4-7 beers some days and none others. 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    Social Determinants of Health   Financial Resource Strain: Not on file  Food Insecurity: Not on file  Transportation Needs: Not on file  Physical Activity: Not on file  Stress: Not on file  Social Connections: Not on file   Family History  Problem Relation Age of Onset   Arthritis Mother    Asthma Mother    Hypertension Mother    Hearing loss Father    Alcohol abuse Sister    Cancer Sister    Depression Sister    Drug abuse Sister    Early death Sister    Heart attack Sister    Hypertension  Sister    Miscarriages / Stillbirths Sister    Asthma Sister    Depression Sister    Heart disease Maternal Uncle    Heart disease Maternal Uncle    Alcohol abuse Maternal Grandmother    Cancer Maternal Grandmother    Cancer Maternal Grandfather    Asthma Daughter    Asthma Son    No Known Allergies Current Outpatient Medications  Medication Sig Dispense Refill   acetaminophen (TYLENOL) 500 MG tablet Take 1,000 mg by mouth every 8 (eight) hours as needed for moderate pain.     albuterol (VENTOLIN HFA) 108 (90 Base) MCG/ACT inhaler Inhale 2 puffs into the lungs every 6 (six) hours as needed for wheezing or shortness of breath. 8 g 2   amLODipine (NORVASC) 5 MG tablet Take 1 tablet (5 mg total) by mouth daily. 90 tablet 3   fexofenadine (ALLEGRA ALLERGY) 180 MG tablet Take 1 tablet (180 mg total) by mouth daily. 10 tablet 1   Fexofenadine-Pseudoephedrine (ALLEGRA-D  PO) Take 1 tablet by mouth daily as needed (allergies).     omeprazole (PRILOSEC) 20 MG capsule Take 1 capsule (20 mg total) by mouth daily. 30 capsule 3   triamcinolone (NASACORT) 55 MCG/ACT AERO nasal inhaler Place 2 sprays into the nose daily. (Patient taking differently: Place 2 sprays into the nose daily as needed (allergies).) 1 each 4   No current facility-administered medications for this visit.   No results found.  Review of Systems:   A ROS was performed including pertinent positives and negatives as documented in the HPI.   Musculoskeletal Exam:    There were no vitals taken for this visit.  Incisions are healed. no erythema or drainage.  Active forward elevation is to 150 degrees.  External rotation at the side is to 50 sensation intact all distributions including axillary distribution 2+ radial pulse.  Internal rotation is to L1  Imaging:    None  I personally reviewed and interpreted the radiographs.   Assessment:   61 year old male status post right shoulder rotator cuff repair.  Overall he continues to improve and is now nearly equal to his contralateral side.  At this point I do believe that he may begin to resume baseball.  I discussed that he should begin this with a slow progression.  I will plan to see him back as needed Plan :    -Return to clinic as needed   I personally saw and evaluated the patient, and participated in the management and treatment plan.  Huel Cote, MD Attending Physician, Orthopedic Surgery  This document was dictated using Dragon voice recognition software. A reasonable attempt at proof reading has been made to minimize errors.

## 2022-02-19 ENCOUNTER — Encounter: Payer: Self-pay | Admitting: Unknown Physician Specialty

## 2022-02-19 ENCOUNTER — Ambulatory Visit (INDEPENDENT_AMBULATORY_CARE_PROVIDER_SITE_OTHER): Payer: Managed Care, Other (non HMO) | Admitting: Unknown Physician Specialty

## 2022-02-19 VITALS — BP 120/77 | HR 67 | Temp 98.8°F | Ht 68.0 in | Wt 201.6 lb

## 2022-02-19 DIAGNOSIS — Z0000 Encounter for general adult medical examination without abnormal findings: Secondary | ICD-10-CM | POA: Diagnosis not present

## 2022-02-19 DIAGNOSIS — M75121 Complete rotator cuff tear or rupture of right shoulder, not specified as traumatic: Secondary | ICD-10-CM

## 2022-02-19 DIAGNOSIS — N529 Male erectile dysfunction, unspecified: Secondary | ICD-10-CM

## 2022-02-19 DIAGNOSIS — Z23 Encounter for immunization: Secondary | ICD-10-CM | POA: Diagnosis not present

## 2022-02-19 DIAGNOSIS — F1010 Alcohol abuse, uncomplicated: Secondary | ICD-10-CM

## 2022-02-19 DIAGNOSIS — I10 Essential (primary) hypertension: Secondary | ICD-10-CM

## 2022-02-19 MED ORDER — AMLODIPINE BESYLATE 5 MG PO TABS: 5.0000 mg | ORAL_TABLET | Freq: Every day | ORAL | 3 refills | Status: DC

## 2022-02-19 NOTE — Assessment & Plan Note (Addendum)
New diagnosis.  Helped by Cialis found on-line.  Would like me to order.  Will rx 10 mg to take PRN.  Consider Testosterone check at another time early morning

## 2022-02-19 NOTE — Assessment & Plan Note (Signed)
Recent surgical repair

## 2022-02-19 NOTE — Assessment & Plan Note (Signed)
Stable, when on meds.  Encouraged to restart Amlodipine

## 2022-02-19 NOTE — Progress Notes (Signed)
BP 120/77   Pulse 67   Temp 98.8 F (37.1 C) (Oral)   Ht _0  (1.727 m)   Wt 201 lb 9.6 oz (91.4 kg)   SpO2 97%   BMI 30.65 kg/m    Subjective:    Patient ID: Craig Finley, male    DOB: 1960/05/10, 61 y.o.   MRN: [id number]  HPI: Craig Finley is a 61 y.o. male  Hypertension Stopped taking for a little while.  Average home BPs: SBP 130-140  No problems or lightheadedness No chest pain with exertion or shortness of breath No Edema  ED Pt with ED ever since shoulder surgery last January. Having trouble with getting and maintaining an erection. Consulted with "[facility]" online.  Given Cialis 10 mg.  but concerned about the price.     Chief Complaint  Patient presents with   Annual Exam   Social History   Socioeconomic History   Marital status: Married    Spouse name: Not on file   Number of children: Not on file   Years of education: Not on file   Highest education level: Some college, no degree  Occupational History   Occupation: trucker  Tobacco Use   Smoking status: Never   Smokeless tobacco: Former    Types: Snuff    Quit date: 04/02/2021   Tobacco comments:    dipping x 25-30 years  Vaping Use   Vaping Use: Never used  Substance and Sexual Activity   Alcohol use: Yes    Comment: occasional   Drug use: Never   Sexual activity: Yes  Other Topics Concern   Not on file  Social History Narrative   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    Social Determinants of Health   Financial Resource Strain: Not on file  Food Insecurity: Not on file  Transportation Needs: Not on file  Physical Activity: Not on file  Stress: Not on file  Social Connections: Not on file  Intimate Partner Violence: Not on file   Family History  Problem Relation Age of Onset   Arthritis Mother    Asthma Mother    Hypertension Mother    Hearing loss Father    Alcohol abuse Sister    Cancer Sister    Depression Sister    Drug abuse Sister     Early death Sister    Heart attack Sister    Hypertension Sister    Miscarriages / Stillbirths Sister    Asthma Sister    Depression Sister    Heart disease Maternal Uncle    Heart disease Maternal Uncle    Alcohol abuse Maternal Grandmother    Cancer Maternal Grandmother    Cancer Maternal Grandfather    Asthma Daughter    Asthma Son    Past Medical History:  Diagnosis Date   Allergy    Blood in stool    Chest pain    a. 11/2019 Cor CTA: Ca2 = 0. Nl cors.   CKD (chronic kidney disease) stage 2, GFR 60-89 ml/min    GERD (gastroesophageal reflux disease)    Heart murmur    as a child:   Hypertension    Pulmonary nodules    a. 11/2019 Cor CTA: Small L lung nodules - largest 64m - incidental finding; b. 12/2020 Chest CT: Sm bilat pulm nodules - unchanged.   Relevant past medical, surgical, family and social history reviewed and updated as indicated. Interim medical history since our last visit  reviewed. Allergies and medications reviewed and updated.  Review of Systems  Per HPI unless specifically indicated above     Objective:    BP 120/77   Pulse 67   Temp 98.8 F (37.1 C) (Oral)   Ht _0  (1.727 m)   Wt 201 lb 9.6 oz (91.4 kg)   SpO2 97%   BMI 30.65 kg/m   Wt Readings from Last 3 Encounters:  02/19/22 201 lb 9.6 oz (91.4 kg)  08/02/21 210 lb (95.3 kg)  07/12/21 209 lb 9.6 oz (95.1 kg)    Physical Exam Constitutional:      General: He is not in acute distress.    Appearance: Normal appearance. He is well-developed.  HENT:     Head: Normocephalic and atraumatic.     Comments: Note poor dentition.  Encouraged to make appointment with dentist.   Eyes:     General: Lids are normal. No scleral icterus.       Right eye: No discharge.        Left eye: No discharge.     Conjunctiva/sclera: Conjunctivae normal.  Neck:     Vascular: No carotid bruit or JVD.  Cardiovascular:     Rate and Rhythm: Normal rate and regular rhythm.     Heart sounds: Normal heart  sounds.  Pulmonary:     Effort: Pulmonary effort is normal. No respiratory distress.     Breath sounds: Normal breath sounds.  Abdominal:     Palpations: There is no hepatomegaly or splenomegaly.  Genitourinary:    Comments: Pt declining prostate exam but wants PSA.  Risks and benefits of both discussed.   Musculoskeletal:        General: Normal range of motion.     Cervical back: Normal range of motion and neck supple.  Skin:    General: Skin is warm and dry.     Coloration: Skin is not pale.     Findings: No rash.  Neurological:     Mental Status: He is alert and oriented to person, place, and time.  Psychiatric:        Behavior: Behavior normal.        Thought Content: Thought content normal.        Judgment: Judgment normal.      Results for orders placed or performed in visit on 08/02/21  CBC with Differential/Platelet  Result Value Ref Range   WBC 5.9 3.4 - 10.8 x10E3/uL   RBC 5.12 4.14 - 5.80 x10E6/uL   Hemoglobin 14.9 13.0 - 17.7 g/dL   Hematocrit 43.8 37.5 - 51.0 %   MCV 86 79 - 97 fL   MCH 29.1 26.6 - 33.0 pg   MCHC 34.0 31.5 - 35.7 g/dL   RDW 11.8 11.6 - 15.4 %   Platelets 236 150 - 450 x10E3/uL   Neutrophils 66 Not Estab. %   Lymphs 21 Not Estab. %   Monocytes 6 Not Estab. %   Eos 6 Not Estab. %   Basos 1 Not Estab. %   Neutrophils Absolute 3.9 1.4 - 7.0 x10E3/uL   Lymphocytes Absolute 1.2 0.7 - 3.1 x10E3/uL   Monocytes Absolute 0.3 0.1 - 0.9 x10E3/uL   EOS (ABSOLUTE) 0.4 0.0 - 0.4 x10E3/uL   Basophils Absolute 0.0 0.0 - 0.2 x10E3/uL   Immature Granulocytes 0 Not Estab. %   Immature Grans (Abs) 0.0 0.0 - 0.1 x10E3/uL  Comprehensive metabolic panel  Result Value Ref Range   Glucose 90 70 - 99 mg/dL  BUN 11 8 - 27 mg/dL   Creatinine, Ser 1.37 (H) 0.76 - 1.27 mg/dL   eGFR 59 (L) >59 mL/min/1.73   BUN/Creatinine Ratio 8 (L) 10 - 24   Sodium 143 134 - 144 mmol/L   Potassium 4.5 3.5 - 5.2 mmol/L   Chloride 106 96 - 106 mmol/L   CO2 23 20 - 29 mmol/L    Calcium 9.5 8.6 - 10.2 mg/dL   Total Protein 7.3 6.0 - 8.5 g/dL   Albumin 4.6 3.8 - 4.9 g/dL   Globulin, Total 2.7 1.5 - 4.5 g/dL   Albumin/Globulin Ratio 1.7 1.2 - 2.2   Bilirubin Total 0.6 0.0 - 1.2 mg/dL   Alkaline Phosphatase 55 44 - 121 IU/L   AST 23 0 - 40 IU/L   ALT 32 0 - 44 IU/L  Urinalysis, Routine w reflex microscopic  Result Value Ref Range   Specific Gravity, UA 1.015 1.005 - 1.030   pH, UA 5.5 5.0 - 7.5   Color, UA Yellow Yellow   Appearance Ur Clear Clear   Leukocytes,UA Negative Negative   Protein,UA Negative Negative/Trace   Glucose, UA Negative Negative   Ketones, UA Negative Negative   RBC, UA Negative Negative   Bilirubin, UA Negative Negative   Urobilinogen, Ur 0.2 0.2 - 1.0 mg/dL   Nitrite, UA Negative Negative      Assessment & Plan:   Problem List Items Addressed This Visit       Unprioritized   Complete tear of right rotator cuff    Recent surgical repair      ED (erectile dysfunction)    New diagnosis.  Helped by Cialis found on-line.  Would like me to order.  Will rx 10 mg to take PRN.  Consider Testosterone check at another time early morning      Essential hypertension    Stable, when on meds.  Encouraged to restart Amlodipine      Relevant Medications   amLODipine (NORVASC) 5 MG tablet   Other Relevant Orders   Comprehensive metabolic panel   TSH   Lipid Panel w/o Chol/HDL Ratio   ETOH abuse    Drinks only on occasion now      Other Visit Diagnoses     Need for influenza vaccination    -  Primary   Relevant Orders   Flu Vaccine QUAD 78moIM (Fluarix, Fluzone & Alfiuria Quad PF) (Completed)   Routine general medical examination at a health care facility       Relevant Orders   CBC with Differential/Platelet   PSA   Lipid Panel w/o Chol/HDL Ratio        Follow up plan: F/u in 6 months   Health maintenance items reviewed and up to date.  Next colonoscopy due 2031.  Immunizations are up to date.

## 2022-02-19 NOTE — Assessment & Plan Note (Signed)
Drinks only on occasion now

## 2022-02-20 LAB — CBC WITH DIFFERENTIAL/PLATELET
Basophils Absolute: 0 10*3/uL (ref 0.0–0.2)
Basos: 1 %
EOS (ABSOLUTE): 0.3 10*3/uL (ref 0.0–0.4)
Eos: 5 %
Hematocrit: 48.2 % (ref 37.5–51.0)
Hemoglobin: 16.5 g/dL (ref 13.0–17.7)
Immature Grans (Abs): 0 10*3/uL (ref 0.0–0.1)
Immature Granulocytes: 1 %
Lymphocytes Absolute: 1.3 10*3/uL (ref 0.7–3.1)
Lymphs: 26 %
MCH: 29 pg (ref 26.6–33.0)
MCHC: 34.2 g/dL (ref 31.5–35.7)
MCV: 85 fL (ref 79–97)
Monocytes Absolute: 0.3 10*3/uL (ref 0.1–0.9)
Monocytes: 6 %
Neutrophils Absolute: 3.3 10*3/uL (ref 1.4–7.0)
Neutrophils: 61 %
Platelets: 217 10*3/uL (ref 150–450)
RBC: 5.69 x10E6/uL (ref 4.14–5.80)
RDW: 11.9 % (ref 11.6–15.4)
WBC: 5.3 10*3/uL (ref 3.4–10.8)

## 2022-02-20 LAB — COMPREHENSIVE METABOLIC PANEL
ALT: 24 IU/L (ref 0–44)
AST: 22 IU/L (ref 0–40)
Albumin/Globulin Ratio: 1.6 (ref 1.2–2.2)
Albumin: 4.5 g/dL (ref 3.9–4.9)
Alkaline Phosphatase: 56 IU/L (ref 44–121)
BUN/Creatinine Ratio: 7 — ABNORMAL LOW (ref 10–24)
BUN: 8 mg/dL (ref 8–27)
Bilirubin Total: 1.9 mg/dL — ABNORMAL HIGH (ref 0.0–1.2)
CO2: 25 mmol/L (ref 20–29)
Calcium: 9.6 mg/dL (ref 8.6–10.2)
Chloride: 98 mmol/L (ref 96–106)
Creatinine, Ser: 1.23 mg/dL (ref 0.76–1.27)
Globulin, Total: 2.9 g/dL (ref 1.5–4.5)
Glucose: 95 mg/dL (ref 70–99)
Potassium: 4.1 mmol/L (ref 3.5–5.2)
Sodium: 139 mmol/L (ref 134–144)
Total Protein: 7.4 g/dL (ref 6.0–8.5)
eGFR: 67 mL/min/{1.73_m2} (ref >59)

## 2022-02-20 LAB — LIPID PANEL W/O CHOL/HDL RATIO
Cholesterol, Total: 202 mg/dL — ABNORMAL HIGH (ref 100–199)
HDL: 52 mg/dL (ref >39)
LDL Chol Calc (NIH): 129 mg/dL — ABNORMAL HIGH (ref 0–99)
Triglycerides: 119 mg/dL (ref 0–149)
VLDL Cholesterol Cal: 21 mg/dL (ref 5–40)

## 2022-02-20 LAB — PSA: Prostate Specific Ag, Serum: 1.2 ng/mL (ref 0.0–4.0)

## 2022-02-20 LAB — TSH: TSH: 1.97 u[IU]/mL (ref 0.450–4.500)

## 2022-06-11 ENCOUNTER — Encounter: Payer: Self-pay | Admitting: Family Medicine

## 2022-06-11 ENCOUNTER — Ambulatory Visit (INDEPENDENT_AMBULATORY_CARE_PROVIDER_SITE_OTHER): Payer: Managed Care, Other (non HMO) | Admitting: Family Medicine

## 2022-06-11 VITALS — BP 132/86 | HR 65 | Temp 98.4°F | Ht 67.0 in | Wt 203.5 lb

## 2022-06-11 DIAGNOSIS — R7989 Other specified abnormal findings of blood chemistry: Secondary | ICD-10-CM | POA: Diagnosis not present

## 2022-06-11 DIAGNOSIS — I10 Essential (primary) hypertension: Secondary | ICD-10-CM | POA: Diagnosis not present

## 2022-06-11 DIAGNOSIS — Z13220 Encounter for screening for lipoid disorders: Secondary | ICD-10-CM | POA: Diagnosis not present

## 2022-06-11 DIAGNOSIS — N401 Enlarged prostate with lower urinary tract symptoms: Secondary | ICD-10-CM | POA: Diagnosis not present

## 2022-06-11 DIAGNOSIS — R350 Frequency of micturition: Secondary | ICD-10-CM

## 2022-06-11 LAB — MICROALBUMIN, URINE WAIVED
Creatinine, Urine Waived: 50 mg/dL (ref 10–300)
Microalb, Ur Waived: 30 mg/L — ABNORMAL HIGH (ref 0–19)
Microalb/Creat Ratio: <30 mg/g (ref <30)

## 2022-06-11 LAB — URINALYSIS, ROUTINE W REFLEX MICROSCOPIC
Bilirubin, UA: NEGATIVE
Glucose, UA: NEGATIVE
Ketones, UA: NEGATIVE
Leukocytes,UA: NEGATIVE
Nitrite, UA: NEGATIVE
Protein,UA: NEGATIVE
Specific Gravity, UA: 1.01 (ref 1.005–1.030)
Urobilinogen, Ur: 0.2 mg/dL (ref 0.2–1.0)
pH, UA: 5.5 (ref 5.0–7.5)

## 2022-06-11 LAB — MICROSCOPIC EXAMINATION
Bacteria, UA: NONE SEEN
RBC, Urine: NONE SEEN /hpf (ref 0–2)
WBC, UA: NONE SEEN /hpf (ref 0–5)

## 2022-06-11 MED ORDER — TADALAFIL 5 MG PO TABS: 5.0000 mg | ORAL_TABLET | Freq: Every day | ORAL | 2 refills | Status: DC

## 2022-06-11 MED ORDER — AMLODIPINE BESYLATE 10 MG PO TABS: 10.0000 mg | ORAL_TABLET | Freq: Every day | ORAL | 3 refills | Status: DC

## 2022-06-11 NOTE — Assessment & Plan Note (Signed)
Running a bit high at home. Will increase his amlodipine to 10mg  and recheck 1-2 months. Call with any concerns.

## 2022-06-11 NOTE — Assessment & Plan Note (Signed)
Under good control on current regimen. Continue current regimen. Continue to monitor. Call with any concerns. Refills given. Labs drawn today.   

## 2022-06-11 NOTE — Assessment & Plan Note (Signed)
Rechecking labs today. Await results. Treat as needed.  °

## 2022-06-11 NOTE — Progress Notes (Signed)
BP 132/86   Pulse 65   Temp 98.4 F (36.9 C) (Oral)   Ht 5\' 7"  (1.702 m)   Wt 203 lb 8 oz (92.3 kg)   SpO2 97%   BMI 31.87 kg/m    Subjective:    Patient ID: Craig Finley, male    DOB: Apr 08, 1960, 62 y.o.   MRN: [id number]  HPI: Craig Finley is a 62 y.o. male  Chief Complaint  Patient presents with   Hypertension   HYPERTENSION  Hypertension status: controlled  Satisfied with current treatment? yes Duration of hypertension: chronic BP monitoring frequency:  not checking BP medication side effects:  no Medication compliance: poor compliance Previous BP meds: amlodipine Aspirin: no Recurrent headaches: no Visual changes: no Palpitations: no Dyspnea: no Chest pain: no Lower extremity edema: no Dizzy/lightheaded: no  BPH BPH status: stable Satisfied with current treatment?: yes Medication side effects: no Medication compliance: excellent compliance Duration: chronic Nocturia: 2-3x per night Urinary frequency:yes Incomplete voiding: yes Urgency: yes Weak urinary stream: no Straining to start stream: yes Dysuria: no Onset: gradual Severity: mild  Relevant past medical, surgical, family and social history reviewed and updated as indicated. Interim medical history since our last visit reviewed. Allergies and medications reviewed and updated.  Review of Systems  Constitutional: Negative.   Respiratory: Negative.    Cardiovascular: Negative.   Gastrointestinal: Negative.   Musculoskeletal: Negative.   Neurological: Negative.   Psychiatric/Behavioral: Negative.      Per HPI unless specifically indicated above     Objective:    BP 132/86   Pulse 65   Temp 98.4 F (36.9 C) (Oral)   Ht 5\' 7"  (1.702 m)   Wt 203 lb 8 oz (92.3 kg)   SpO2 97%   BMI 31.87 kg/m   Wt Readings from Last 3 Encounters:  06/11/22 203 lb 8 oz (92.3 kg)  02/19/22 201 lb 9.6 oz (91.4 kg)  08/02/21 210 lb (95.3 kg)    Physical Exam Vitals and  nursing note reviewed.  Constitutional:      General: He is not in acute distress.    Appearance: Normal appearance. He is obese. He is not ill-appearing, toxic-appearing or diaphoretic.  HENT:     Head: Normocephalic and atraumatic.     Right Ear: External ear normal.     Left Ear: External ear normal.     Nose: Nose normal.     Mouth/Throat:     Mouth: Mucous membranes are moist.     Pharynx: Oropharynx is clear.  Eyes:     General: No scleral icterus.       Right eye: No discharge.        Left eye: No discharge.     Extraocular Movements: Extraocular movements intact.     Conjunctiva/sclera: Conjunctivae normal.     Pupils: Pupils are equal, round, and reactive to light.  Cardiovascular:     Rate and Rhythm: Normal rate and regular rhythm.     Pulses: Normal pulses.     Heart sounds: Normal heart sounds. No murmur heard.    No friction rub. No gallop.  Pulmonary:     Effort: Pulmonary effort is normal. No respiratory distress.     Breath sounds: Normal breath sounds. No stridor. No wheezing, rhonchi or rales.  Chest:     Chest wall: No tenderness.  Musculoskeletal:        General: Normal range of motion.     Cervical back: Normal range of motion and  neck supple.  Skin:    General: Skin is warm and dry.     Capillary Refill: Capillary refill takes less than 2 seconds.     Coloration: Skin is not jaundiced or pale.     Findings: No bruising, erythema, lesion or rash.  Neurological:     General: No focal deficit present.     Mental Status: He is alert and oriented to person, place, and time. Mental status is at baseline.  Psychiatric:        Mood and Affect: Mood normal.        Behavior: Behavior normal.        Thought Content: Thought content normal.        Judgment: Judgment normal.     Results for orders placed or performed in visit on 02/19/22  CBC with Differential/Platelet  Result Value Ref Range   WBC 5.3 3.4 - 10.8 x10E3/uL   RBC 5.69 4.14 - 5.80 x10E6/uL    Hemoglobin 16.5 13.0 - 17.7 g/dL   Hematocrit 48.2 37.5 - 51.0 %   MCV 85 79 - 97 fL   MCH 29.0 26.6 - 33.0 pg   MCHC 34.2 31.5 - 35.7 g/dL   RDW 11.9 11.6 - 15.4 %   Platelets 217 150 - 450 x10E3/uL   Neutrophils 61 Not Estab. %   Lymphs 26 Not Estab. %   Monocytes 6 Not Estab. %   Eos 5 Not Estab. %   Basos 1 Not Estab. %   Neutrophils Absolute 3.3 1.4 - 7.0 x10E3/uL   Lymphocytes Absolute 1.3 0.7 - 3.1 x10E3/uL   Monocytes Absolute 0.3 0.1 - 0.9 x10E3/uL   EOS (ABSOLUTE) 0.3 0.0 - 0.4 x10E3/uL   Basophils Absolute 0.0 0.0 - 0.2 x10E3/uL   Immature Granulocytes 1 Not Estab. %   Immature Grans (Abs) 0.0 0.0 - 0.1 x10E3/uL  Comprehensive metabolic panel  Result Value Ref Range   Glucose 95 70 - 99 mg/dL   BUN 8 8 - 27 mg/dL   Creatinine, Ser 1.23 0.76 - 1.27 mg/dL   eGFR 67 >59 mL/min/1.73   BUN/Creatinine Ratio 7 (L) 10 - 24   Sodium 139 134 - 144 mmol/L   Potassium 4.1 3.5 - 5.2 mmol/L   Chloride 98 96 - 106 mmol/L   CO2 25 20 - 29 mmol/L   Calcium 9.6 8.6 - 10.2 mg/dL   Total Protein 7.4 6.0 - 8.5 g/dL   Albumin 4.5 3.9 - 4.9 g/dL   Globulin, Total 2.9 1.5 - 4.5 g/dL   Albumin/Globulin Ratio 1.6 1.2 - 2.2   Bilirubin Total 1.9 (H) 0.0 - 1.2 mg/dL   Alkaline Phosphatase 56 44 - 121 IU/L   AST 22 0 - 40 IU/L   ALT 24 0 - 44 IU/L  TSH  Result Value Ref Range   TSH 1.970 0.450 - 4.500 uIU/mL  PSA  Result Value Ref Range   Prostate Specific Ag, Serum 1.2 0.0 - 4.0 ng/mL  Lipid Panel w/o Chol/HDL Ratio  Result Value Ref Range   Cholesterol, Total 202 (H) 100 - 199 mg/dL   Triglycerides 119 0 - 149 mg/dL   HDL 52 >39 mg/dL   VLDL Cholesterol Cal 21 5 - 40 mg/dL   LDL Chol Calc (NIH) 129 (H) 0 - 99 mg/dL      Assessment & Plan:   Problem List Items Addressed This Visit       Cardiovascular and Mediastinum   Essential hypertension - Primary  Running a bit high at home. Will increase his amlodipine to 10mg  and recheck 1-2 months. Call with any concerns.        Relevant Medications   tadalafil (CIALIS) 5 MG tablet   amLODipine (NORVASC) 10 MG tablet   Other Relevant Orders   CBC with Differential/Platelet   TSH   Urinalysis, Routine w reflex microscopic   Microalbumin, Urine Waived     Other   Elevated LFTs    Rechecking labs today. Await results. Treat as needed.       Relevant Orders   Comprehensive metabolic panel   Benign prostatic hyperplasia with urinary frequency    Under good control on current regimen. Continue current regimen. Continue to monitor. Call with any concerns. Refills given. Labs drawn today.       Relevant Orders   PSA   Other Visit Diagnoses     Screening for cholesterol level       Labs drawn today. Await results.   Relevant Orders   Lipid Panel w/o Chol/HDL Ratio        Follow up plan: Return in about 2 months (around 08/11/2022).

## 2022-06-12 LAB — COMPREHENSIVE METABOLIC PANEL
ALT: 34 IU/L (ref 0–44)
AST: 29 IU/L (ref 0–40)
Albumin/Globulin Ratio: 1.7 (ref 1.2–2.2)
Albumin: 4.6 g/dL (ref 3.9–4.9)
Alkaline Phosphatase: 68 IU/L (ref 44–121)
BUN/Creatinine Ratio: 8 — ABNORMAL LOW (ref 10–24)
BUN: 10 mg/dL (ref 8–27)
Bilirubin Total: 1.3 mg/dL — ABNORMAL HIGH (ref 0.0–1.2)
CO2: 24 mmol/L (ref 20–29)
Calcium: 9.9 mg/dL (ref 8.6–10.2)
Chloride: 103 mmol/L (ref 96–106)
Creatinine, Ser: 1.29 mg/dL — ABNORMAL HIGH (ref 0.76–1.27)
Globulin, Total: 2.7 g/dL (ref 1.5–4.5)
Glucose: 104 mg/dL — ABNORMAL HIGH (ref 70–99)
Potassium: 4.2 mmol/L (ref 3.5–5.2)
Sodium: 141 mmol/L (ref 134–144)
Total Protein: 7.3 g/dL (ref 6.0–8.5)
eGFR: 63 mL/min/{1.73_m2} (ref >59)

## 2022-06-12 LAB — CBC WITH DIFFERENTIAL/PLATELET
Basophils Absolute: 0 10*3/uL (ref 0.0–0.2)
Basos: 0 %
EOS (ABSOLUTE): 0.3 10*3/uL (ref 0.0–0.4)
Eos: 6 %
Hematocrit: 50.3 % (ref 37.5–51.0)
Hemoglobin: 16.5 g/dL (ref 13.0–17.7)
Immature Grans (Abs): 0 10*3/uL (ref 0.0–0.1)
Immature Granulocytes: 0 %
Lymphocytes Absolute: 1.5 10*3/uL (ref 0.7–3.1)
Lymphs: 29 %
MCH: 28.3 pg (ref 26.6–33.0)
MCHC: 32.8 g/dL (ref 31.5–35.7)
MCV: 86 fL (ref 79–97)
Monocytes Absolute: 0.3 10*3/uL (ref 0.1–0.9)
Monocytes: 6 %
Neutrophils Absolute: 3.1 10*3/uL (ref 1.4–7.0)
Neutrophils: 59 %
Platelets: 228 10*3/uL (ref 150–450)
RBC: 5.83 x10E6/uL — ABNORMAL HIGH (ref 4.14–5.80)
RDW: 12.9 % (ref 11.6–15.4)
WBC: 5.3 10*3/uL (ref 3.4–10.8)

## 2022-06-12 LAB — LIPID PANEL W/O CHOL/HDL RATIO
Cholesterol, Total: 193 mg/dL (ref 100–199)
HDL: 53 mg/dL (ref >39)
LDL Chol Calc (NIH): 122 mg/dL — ABNORMAL HIGH (ref 0–99)
Triglycerides: 97 mg/dL (ref 0–149)
VLDL Cholesterol Cal: 18 mg/dL (ref 5–40)

## 2022-06-12 LAB — PSA: Prostate Specific Ag, Serum: 1.4 ng/mL (ref 0.0–4.0)

## 2022-06-12 LAB — TSH: TSH: 1.69 u[IU]/mL (ref 0.450–4.500)

## 2022-07-06 NOTE — Patient Instructions (Signed)

## 2022-07-09 ENCOUNTER — Encounter: Payer: Self-pay | Admitting: Nurse Practitioner

## 2022-07-09 ENCOUNTER — Ambulatory Visit (INDEPENDENT_AMBULATORY_CARE_PROVIDER_SITE_OTHER): Payer: Self-pay | Admitting: Nurse Practitioner

## 2022-07-09 VITALS — BP 124/77 | HR 65 | Temp 98.6°F | Ht 67.01 in | Wt 203.9 lb

## 2022-07-09 DIAGNOSIS — Z0289 Encounter for other administrative examinations: Secondary | ICD-10-CM

## 2022-07-09 NOTE — Progress Notes (Signed)
BP 124/77   Pulse 65   Temp 98.6 F (37 C) (Oral)   Ht 5' 7.01" (1.702 m)   Wt 203 lb 14.4 oz (92.5 kg)   SpO2 98%   BMI 31.93 kg/m    Subjective:    Patient ID: Craig Finley, male    DOB: Sep 27, 1960, 62 y.o.   MRN: [id number]  HPI: Craig Finley is a 62 y.o. male presenting on 07/09/2022 for DOT Physical.  Previous DOT physical was about three months ago, was given a 3 month certificate due elevation in blood pressure at physical and told to follow-up with PCP.  He saw PCP in office on 06/11/22, was started on Amlodipine with BP now trending down.  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  Past Medical History:  Past Medical History:  Diagnosis Date   Allergy    Blood in stool    Chest pain    a. 11/2019 Cor CTA: Ca2 = 0. Nl cors.   CKD (chronic kidney disease) stage 2, GFR 60-89 ml/min    GERD (gastroesophageal reflux disease)    Heart murmur    as a child:   Hypertension    Pulmonary nodules    a. 11/2019 Cor CTA: Small L lung nodules - largest 4mm - incidental finding; b. 12/2020 Chest CT: Sm bilat pulm nodules - unchanged.    Surgical History:  Past Surgical History:  Procedure Laterality Date   ANAL FISSURE REPAIR  05/1998   some type of anal fissure procedure   ARTHOSCOPIC ROTAOR CUFF REPAIR Right 04/23/2021   Procedure: Right ARTHROSCOPIC ROTATOR CUFF REPAIR;  Surgeon: Huel Cote, MD;  Location: [facility] OR;  Service: Orthopedics;  Laterality: Right;   COLONOSCOPY WITH PROPOFOL N/A 01/31/2020   Procedure: COLONOSCOPY WITH PROPOFOL;  Surgeon: Toney Reil, MD;  Location: [facility] ENDOSCOPY;  Service: Gastroenterology;  Laterality: N/A;   ESOPHAGOGASTRODUODENOSCOPY (EGD) WITH PROPOFOL N/A 01/31/2020   Procedure: ESOPHAGOGASTRODUODENOSCOPY (EGD) WITH PROPOFOL;  Surgeon: Toney Reil, MD;  Location: [facility] ENDOSCOPY;  Service: Gastroenterology;  Laterality: N/A;    Medications:  Current Outpatient  Medications on File Prior to Visit  Medication Sig   albuterol (VENTOLIN HFA) 108 (90 Base) MCG/ACT inhaler Inhale 2 puffs into the lungs every 6 (six) hours as needed for wheezing or shortness of breath.   amLODipine (NORVASC) 10 MG tablet Take 1 tablet (10 mg total) by mouth daily.   Fexofenadine-Pseudoephedrine (ALLEGRA-D PO) Take 1 tablet by mouth daily as needed (allergies).   tadalafil (CIALIS) 5 MG tablet Take 1 tablet (5 mg total) by mouth daily.   No current facility-administered medications on file prior to visit.    Allergies:  No Known Allergies  Social History:  Social History   Socioeconomic History   Marital status: Married    Spouse name: Not on file   Number of children: Not on file   Years of education: Not on file   Highest education level: Some college, no degree  Occupational History   Occupation: trucker  Tobacco Use   Smoking status: Never   Smokeless tobacco: Former    Types: Snuff    Quit date: 04/02/2021   Tobacco comments:    dipping x 25-30 years  Vaping Use   Vaping Use: Never used  Substance and Sexual Activity   Alcohol use: Yes    Comment: occasional   Drug use: Never   Sexual activity: Yes  Other Topics Concern   Not on  file  Social History Narrative   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    Social Determinants of Health   Financial Resource Strain: Not on file  Food Insecurity: Not on file  Transportation Needs: Not on file  Physical Activity: Not on file  Stress: Not on file  Social Connections: Not on file  Intimate Partner Violence: Not on file   Social History   Tobacco Use  Smoking Status Never  Smokeless Tobacco Former   Types: Snuff   Quit date: 04/02/2021  Tobacco Comments   dipping x 25-30 years   Social History   Substance and Sexual Activity  Alcohol Use Yes   Comment: occasional    Family History:  Family History  Problem Relation Age of Onset   Arthritis Mother    Asthma Mother     Hypertension Mother    Hearing loss Father    Alcohol abuse Sister    Cancer Sister    Depression Sister    Drug abuse Sister    Early death Sister    Heart attack Sister    Hypertension Sister    Miscarriages / Stillbirths Sister    Asthma Sister    Depression Sister    Heart disease Maternal Uncle    Heart disease Maternal Uncle    Alcohol abuse Maternal Grandmother    Cancer Maternal Grandmother    Cancer Maternal Grandfather    Asthma Daughter    Asthma Son     Past medical history, surgical history, medications, allergies, family history and social history reviewed with patient today and changes made to appropriate areas of the chart.   ROS All other ROS negative except what is listed above and in the HPI.      Objective:    BP 124/77   Pulse 65   Temp 98.6 F (37 C) (Oral)   Ht 5' 7.01" (1.702 m)   Wt 203 lb 14.4 oz (92.5 kg)   SpO2 98%   BMI 31.93 kg/m   Wt Readings from Last 3 Encounters:  07/09/22 203 lb 14.4 oz (92.5 kg)  06/11/22 203 lb 8 oz (92.3 kg)  02/19/22 201 lb 9.6 oz (91.4 kg)    Physical Exam Vitals and nursing note reviewed.  Constitutional:      General: He is awake. He is not in acute distress.    Appearance: He is well-developed and well-groomed. He is obese. He is not ill-appearing or toxic-appearing.  HENT:     Head: Normocephalic and atraumatic.     Right Ear: Hearing, tympanic membrane, ear canal and external ear normal. No drainage.     Left Ear: Hearing, tympanic membrane, ear canal and external ear normal. No drainage.     Ears:     Comments: Whisper testing passed bilaterally at 6 feet.    Nose: Nose normal.     Mouth/Throat:     Pharynx: Uvula midline.  Eyes:     General: Lids are normal.        Right eye: No discharge.        Left eye: No discharge.     Extraocular Movements: Extraocular movements intact.     Conjunctiva/sclera: Conjunctivae normal.     Pupils: Pupils are equal, round, and reactive to light.     Visual  Fields: Right eye visual fields normal and left eye visual fields normal.     Comments: Visual fields passed at bilaterally.  Neck:     Thyroid: No thyromegaly.  Vascular: No carotid bruit or JVD.     Trachea: Trachea normal.  Cardiovascular:     Rate and Rhythm: Normal rate and regular rhythm.     Heart sounds: Normal heart sounds, S1 normal and S2 normal. No murmur heard.    No gallop.  Pulmonary:     Effort: Pulmonary effort is normal. No accessory muscle usage or respiratory distress.     Breath sounds: Normal breath sounds.  Abdominal:     General: Bowel sounds are normal.     Palpations: Abdomen is soft. There is no hepatomegaly or splenomegaly.     Tenderness: There is no abdominal tenderness.  Musculoskeletal:        General: Normal range of motion.     Cervical back: Normal range of motion and neck supple.     Right lower leg: No edema.     Left lower leg: No edema.  Lymphadenopathy:     Head:     Right side of head: No submental, submandibular, tonsillar, preauricular or posterior auricular adenopathy.     Left side of head: No submental, submandibular, tonsillar, preauricular or posterior auricular adenopathy.     Cervical: No cervical adenopathy.  Skin:    General: Skin is warm and dry.     Capillary Refill: Capillary refill takes less than 2 seconds.     Findings: No rash.  Neurological:     Mental Status: He is alert and oriented to person, place, and time.     Gait: Gait is intact.     Deep Tendon Reflexes: Reflexes are normal and symmetric.     Reflex Scores:      Brachioradialis reflexes are 2+ on the right side and 2+ on the left side.      Patellar reflexes are 2+ on the right side and 2+ on the left side. Psychiatric:        Attention and Perception: Attention normal.        Mood and Affect: Mood normal.        Speech: Speech normal.        Behavior: Behavior normal. Behavior is cooperative.        Thought Content: Thought content normal.         Cognition and Memory: Cognition normal.    Results for orders placed or performed in visit on 06/11/22  Microscopic Examination   Urine  Result Value Ref Range   WBC, UA None seen 0 - 5 /hpf   RBC, Urine None seen 0 - 2 /hpf   Epithelial Cells (non renal) 0-10 0 - 10 /hpf   Bacteria, UA None seen None seen/Few  Comprehensive metabolic panel  Result Value Ref Range   Glucose 104 (H) 70 - 99 mg/dL   BUN 10 8 - 27 mg/dL   Creatinine, Ser 1.61 (H) 0.76 - 1.27 mg/dL   eGFR 63 >09 UE/AVW/0.98   BUN/Creatinine Ratio 8 (L) 10 - 24   Sodium 141 134 - 144 mmol/L   Potassium 4.2 3.5 - 5.2 mmol/L   Chloride 103 96 - 106 mmol/L   CO2 24 20 - 29 mmol/L   Calcium 9.9 8.6 - 10.2 mg/dL   Total Protein 7.3 6.0 - 8.5 g/dL   Albumin 4.6 3.9 - 4.9 g/dL   Globulin, Total 2.7 1.5 - 4.5 g/dL   Albumin/Globulin Ratio 1.7 1.2 - 2.2   Bilirubin Total 1.3 (H) 0.0 - 1.2 mg/dL   Alkaline Phosphatase 68 44 - 121 IU/L  AST 29 0 - 40 IU/L   ALT 34 0 - 44 IU/L  CBC with Differential/Platelet  Result Value Ref Range   WBC 5.3 3.4 - 10.8 x10E3/uL   RBC 5.83 (H) 4.14 - 5.80 x10E6/uL   Hemoglobin 16.5 13.0 - 17.7 g/dL   Hematocrit 40.9 81.1 - 51.0 %   MCV 86 79 - 97 fL   MCH 28.3 26.6 - 33.0 pg   MCHC 32.8 31.5 - 35.7 g/dL   RDW 91.4 78.2 - 95.6 %   Platelets 228 150 - 450 x10E3/uL   Neutrophils 59 Not Estab. %   Lymphs 29 Not Estab. %   Monocytes 6 Not Estab. %   Eos 6 Not Estab. %   Basos 0 Not Estab. %   Neutrophils Absolute 3.1 1.4 - 7.0 x10E3/uL   Lymphocytes Absolute 1.5 0.7 - 3.1 x10E3/uL   Monocytes Absolute 0.3 0.1 - 0.9 x10E3/uL   EOS (ABSOLUTE) 0.3 0.0 - 0.4 x10E3/uL   Basophils Absolute 0.0 0.0 - 0.2 x10E3/uL   Immature Granulocytes 0 Not Estab. %   Immature Grans (Abs) 0.0 0.0 - 0.1 x10E3/uL  Lipid Panel w/o Chol/HDL Ratio  Result Value Ref Range   Cholesterol, Total 193 100 - 199 mg/dL   Triglycerides 97 0 - 149 mg/dL   HDL 53 >21 mg/dL   VLDL Cholesterol Cal 18 5 - 40 mg/dL    LDL Chol Calc (NIH) 122 (H) 0 - 99 mg/dL  PSA  Result Value Ref Range   Prostate Specific Ag, Serum 1.4 0.0 - 4.0 ng/mL  TSH  Result Value Ref Range   TSH 1.690 0.450 - 4.500 uIU/mL  Urinalysis, Routine w reflex microscopic  Result Value Ref Range   Specific Gravity, UA 1.010 1.005 - 1.030   pH, UA 5.5 5.0 - 7.5   Color, UA Yellow Yellow   Appearance Ur Clear Clear   Leukocytes,UA Negative Negative   Protein,UA Negative Negative/Trace   Glucose, UA Negative Negative   Ketones, UA Negative Negative   RBC, UA Trace (A) Negative   Bilirubin, UA Negative Negative   Urobilinogen, Ur 0.2 0.2 - 1.0 mg/dL   Nitrite, UA Negative Negative   Microscopic Examination See below:   Microalbumin, Urine Waived  Result Value Ref Range   Microalb, Ur Waived 30 (H) 0 - 19 mg/L   Creatinine, Urine Waived 50 10 - 300 mg/dL   Microalb/Creat Ratio <30 <30 mg/g      Assessment & Plan:   Problem List Items Addressed This Visit   None Visit Diagnoses     Encounter for examination required by Department of Transportation (DOT)    -  Primary   DOT physical in office today and passed for one year certificate due to HTN -- expires 07/09/2023.        Follow up plan: Return for as scheduled.   PATIENT COUNSELING:    Advised to avoid cigarette smoking.  I discussed with the patient that most people either abstain from alcohol or drink within safe limits (<=14/week and <=4 drinks/occasion for males, <=7/weeks and <= 3 drinks/occasion for females) and that the risk for alcohol disorders and other health effects rises proportionally with the number of drinks per week and how often a drinker exceeds daily limits.  Discussed cessation/primary prevention of drug use and availability of treatment for abuse.   Diet: Encouraged to adjust caloric intake to maintain  or achieve ideal body weight, to reduce intake of dietary saturated fat and total  fat, to limit sodium intake by avoiding high sodium foods and  not adding table salt, and to maintain adequate dietary potassium and calcium preferably from fresh fruits, vegetables, and low-fat dairy products.    Stressed the importance of regular exercise  Injury prevention: Discussed safety belts, safety helmets, smoke detector, smoking near bedding or upholstery.   Dental health: Discussed importance of regular tooth brushing, flossing, and dental visits.    NEXT PREVENTATIVE PHYSICAL DUE IN 1 YEAR. Return for as scheduled.

## 2022-08-12 ENCOUNTER — Ambulatory Visit
Admission: RE | Admit: 2022-08-12 | Discharge: 2022-08-12 | Disposition: A | Discharge status: Home / Self Care | Payer: Managed Care, Other (non HMO) | Source: Ambulatory Visit | Attending: Family Medicine | Admitting: Family Medicine

## 2022-08-12 ENCOUNTER — Ambulatory Visit (INDEPENDENT_AMBULATORY_CARE_PROVIDER_SITE_OTHER): Payer: Managed Care, Other (non HMO) | Admitting: Family Medicine

## 2022-08-12 ENCOUNTER — Ambulatory Visit
Admission: RE | Admit: 2022-08-12 | Discharge: 2022-08-12 | Disposition: A | Discharge status: Home / Self Care | Payer: Managed Care, Other (non HMO) | Attending: Family Medicine | Admitting: Family Medicine

## 2022-08-12 ENCOUNTER — Encounter: Payer: Self-pay | Admitting: Family Medicine

## 2022-08-12 VITALS — BP 118/72 | HR 56 | Temp 98.3°F | Ht 67.0 in | Wt 209.8 lb

## 2022-08-12 DIAGNOSIS — I10 Essential (primary) hypertension: Secondary | ICD-10-CM | POA: Diagnosis not present

## 2022-08-12 DIAGNOSIS — M79674 Pain in right toe(s): Secondary | ICD-10-CM | POA: Diagnosis present

## 2022-08-12 MED ORDER — AMLODIPINE BESYLATE 10 MG PO TABS: 10.0000 mg | ORAL_TABLET | Freq: Every day | ORAL | 1 refills | Status: DC

## 2022-08-12 MED ORDER — ALBUTEROL SULFATE HFA 108 (90 BASE) MCG/ACT IN AERS: 2.0000 | INHALATION_SPRAY | Freq: Four times a day (QID) | RESPIRATORY_TRACT | 6 refills | Status: AC | PRN

## 2022-08-12 NOTE — Progress Notes (Signed)
BP 118/72   Pulse (!) 56   Temp 98.3 F (36.8 C) (Oral)   Ht 5\' 7"  (1.702 m)   Wt 209 lb 12.8 oz (95.2 kg)   SpO2 97%   BMI 32.86 kg/m    Subjective:    Patient ID: Craig Finley, male    DOB: March 10, 1961, 62 y.o.   MRN: [id number]  HPI: Craig Finley is a 62 y.o. male  Chief Complaint  Patient presents with   Hypertension   HYPERTENSION  Hypertension status: controlled  Satisfied with current treatment? yes Duration of hypertension: chronic BP monitoring frequency:  not checking BP medication side effects:  mild ankle swelling Medication compliance: excellent compliance Previous BP meds:amlodipine Aspirin: no Recurrent headaches: no Visual changes: no Palpitations: no Dyspnea: no Chest pain: no Lower extremity edema: yes Dizzy/lightheaded: no  Tripped and hurt his R great toe about 2 weeks ago. Still swollen. Able to walk on it, hurts to bend it. No other concerns or complaints at this time.   Relevant past medical, surgical, family and social history reviewed and updated as indicated. Interim medical history since our last visit reviewed. Allergies and medications reviewed and updated.  Review of Systems  Constitutional: Negative.   Respiratory: Negative.    Cardiovascular: Negative.   Gastrointestinal: Negative.   Musculoskeletal:  Positive for arthralgias. Negative for back pain, gait problem, joint swelling, myalgias, neck pain and neck stiffness.  Skin: Negative.   Neurological: Negative.   Psychiatric/Behavioral: Negative.      Per HPI unless specifically indicated above     Objective:    BP 118/72   Pulse (!) 56   Temp 98.3 F (36.8 C) (Oral)   Ht 5\' 7"  (1.702 m)   Wt 209 lb 12.8 oz (95.2 kg)   SpO2 97%   BMI 32.86 kg/m   Wt Readings from Last 3 Encounters:  08/12/22 209 lb 12.8 oz (95.2 kg)  07/09/22 203 lb 14.4 oz (92.5 kg)  06/11/22 203 lb 8 oz (92.3 kg)    Physical Exam Vitals and nursing note reviewed.   Constitutional:      General: He is not in acute distress.    Appearance: Normal appearance. He is not ill-appearing, toxic-appearing or diaphoretic.  HENT:     Head: Normocephalic and atraumatic.     Right Ear: External ear normal.     Left Ear: External ear normal.     Nose: Nose normal.     Mouth/Throat:     Mouth: Mucous membranes are moist.     Pharynx: Oropharynx is clear.  Eyes:     General: No scleral icterus.       Right eye: No discharge.        Left eye: No discharge.     Extraocular Movements: Extraocular movements intact.     Conjunctiva/sclera: Conjunctivae normal.     Pupils: Pupils are equal, round, and reactive to light.  Cardiovascular:     Rate and Rhythm: Normal rate and regular rhythm.     Pulses: Normal pulses.     Heart sounds: Normal heart sounds. No murmur heard.    No friction rub. No gallop.  Pulmonary:     Effort: Pulmonary effort is normal. No respiratory distress.     Breath sounds: Normal breath sounds. No stridor. No wheezing, rhonchi or rales.  Chest:     Chest wall: No tenderness.  Musculoskeletal:        General: Normal range of motion.  Cervical back: Normal range of motion and neck supple.  Skin:    General: Skin is warm and dry.     Capillary Refill: Capillary refill takes less than 2 seconds.     Coloration: Skin is not jaundiced or pale.     Findings: No bruising, erythema, lesion or rash.  Neurological:     General: No focal deficit present.     Mental Status: He is alert and oriented to person, place, and time. Mental status is at baseline.  Psychiatric:        Mood and Affect: Mood normal.        Behavior: Behavior normal.        Thought Content: Thought content normal.        Judgment: Judgment normal.     Results for orders placed or performed in visit on 06/11/22  Microscopic Examination   Urine  Result Value Ref Range   WBC, UA None seen 0 - 5 /hpf   RBC, Urine None seen 0 - 2 /hpf   Epithelial Cells (non renal)  0-10 0 - 10 /hpf   Bacteria, UA None seen None seen/Few  Comprehensive metabolic panel  Result Value Ref Range   Glucose 104 (H) 70 - 99 mg/dL   BUN 10 8 - 27 mg/dL   Creatinine, Ser 1.61 (H) 0.76 - 1.27 mg/dL   eGFR 63 >09 UE/AVW/0.98   BUN/Creatinine Ratio 8 (L) 10 - 24   Sodium 141 134 - 144 mmol/L   Potassium 4.2 3.5 - 5.2 mmol/L   Chloride 103 96 - 106 mmol/L   CO2 24 20 - 29 mmol/L   Calcium 9.9 8.6 - 10.2 mg/dL   Total Protein 7.3 6.0 - 8.5 g/dL   Albumin 4.6 3.9 - 4.9 g/dL   Globulin, Total 2.7 1.5 - 4.5 g/dL   Albumin/Globulin Ratio 1.7 1.2 - 2.2   Bilirubin Total 1.3 (H) 0.0 - 1.2 mg/dL   Alkaline Phosphatase 68 44 - 121 IU/L   AST 29 0 - 40 IU/L   ALT 34 0 - 44 IU/L  CBC with Differential/Platelet  Result Value Ref Range   WBC 5.3 3.4 - 10.8 x10E3/uL   RBC 5.83 (H) 4.14 - 5.80 x10E6/uL   Hemoglobin 16.5 13.0 - 17.7 g/dL   Hematocrit 11.9 14.7 - 51.0 %   MCV 86 79 - 97 fL   MCH 28.3 26.6 - 33.0 pg   MCHC 32.8 31.5 - 35.7 g/dL   RDW 82.9 56.2 - 13.0 %   Platelets 228 150 - 450 x10E3/uL   Neutrophils 59 Not Estab. %   Lymphs 29 Not Estab. %   Monocytes 6 Not Estab. %   Eos 6 Not Estab. %   Basos 0 Not Estab. %   Neutrophils Absolute 3.1 1.4 - 7.0 x10E3/uL   Lymphocytes Absolute 1.5 0.7 - 3.1 x10E3/uL   Monocytes Absolute 0.3 0.1 - 0.9 x10E3/uL   EOS (ABSOLUTE) 0.3 0.0 - 0.4 x10E3/uL   Basophils Absolute 0.0 0.0 - 0.2 x10E3/uL   Immature Granulocytes 0 Not Estab. %   Immature Grans (Abs) 0.0 0.0 - 0.1 x10E3/uL  Lipid Panel w/o Chol/HDL Ratio  Result Value Ref Range   Cholesterol, Total 193 100 - 199 mg/dL   Triglycerides 97 0 - 149 mg/dL   HDL 53 >86 mg/dL   VLDL Cholesterol Cal 18 5 - 40 mg/dL   LDL Chol Calc (NIH) 578 (H) 0 - 99 mg/dL  PSA  Result Value Ref  Range   Prostate Specific Ag, Serum 1.4 0.0 - 4.0 ng/mL  TSH  Result Value Ref Range   TSH 1.690 0.450 - 4.500 uIU/mL  Urinalysis, Routine w reflex microscopic  Result Value Ref Range   Specific  Gravity, UA 1.010 1.005 - 1.030   pH, UA 5.5 5.0 - 7.5   Color, UA Yellow Yellow   Appearance Ur Clear Clear   Leukocytes,UA Negative Negative   Protein,UA Negative Negative/Trace   Glucose, UA Negative Negative   Ketones, UA Negative Negative   RBC, UA Trace (A) Negative   Bilirubin, UA Negative Negative   Urobilinogen, Ur 0.2 0.2 - 1.0 mg/dL   Nitrite, UA Negative Negative   Microscopic Examination See below:   Microalbumin, Urine Waived  Result Value Ref Range   Microalb, Ur Waived 30 (H) 0 - 19 mg/L   Creatinine, Urine Waived 50 10 - 300 mg/dL   Microalb/Creat Ratio <30 <30 mg/g      Assessment & Plan:   Problem List Items Addressed This Visit       Cardiovascular and Mediastinum   Essential hypertension - Primary    Under good control on current regimen. Continue current regimen. Continue to monitor. Call with any concerns. Refills given. Labs drawn today.       Relevant Medications   amLODipine (NORVASC) 10 MG tablet   Other Relevant Orders   Basic metabolic panel   Other Visit Diagnoses     Great toe pain, right       Will check on x-ray. Symptomatic care. Call with any concerns.   Relevant Orders   DG Foot Complete Right        Follow up plan: Return in about 6 months (around 02/21/2023) for physical.

## 2022-08-12 NOTE — Assessment & Plan Note (Signed)
Under good control on current regimen. Continue current regimen. Continue to monitor. Call with any concerns. Refills given. Labs drawn today.   

## 2022-08-13 LAB — BASIC METABOLIC PANEL
BUN/Creatinine Ratio: 8 — ABNORMAL LOW (ref 10–24)
BUN: 11 mg/dL (ref 8–27)
CO2: 23 mmol/L (ref 20–29)
Calcium: 9.1 mg/dL (ref 8.6–10.2)
Chloride: 100 mmol/L (ref 96–106)
Creatinine, Ser: 1.39 mg/dL — ABNORMAL HIGH (ref 0.76–1.27)
Glucose: 94 mg/dL (ref 70–99)
Potassium: 4.3 mmol/L (ref 3.5–5.2)
Sodium: 140 mmol/L (ref 134–144)
eGFR: 58 mL/min/{1.73_m2} — ABNORMAL LOW (ref >59)

## 2022-08-18 ENCOUNTER — Other Ambulatory Visit: Payer: Self-pay | Admitting: Family Medicine

## 2022-08-18 DIAGNOSIS — M79674 Pain in right toe(s): Secondary | ICD-10-CM

## 2023-02-21 ENCOUNTER — Encounter: Payer: Managed Care, Other (non HMO) | Admitting: Nurse Practitioner

## 2023-02-21 DIAGNOSIS — E7800 Pure hypercholesterolemia, unspecified: Secondary | ICD-10-CM | POA: Insufficient documentation

## 2023-02-21 NOTE — Patient Instructions (Signed)

## 2023-02-25 ENCOUNTER — Encounter: Payer: Self-pay | Admitting: Nurse Practitioner

## 2023-02-25 ENCOUNTER — Ambulatory Visit (INDEPENDENT_AMBULATORY_CARE_PROVIDER_SITE_OTHER): Payer: Managed Care, Other (non HMO) | Admitting: Nurse Practitioner

## 2023-02-25 VITALS — BP 120/67 | HR 58 | Temp 98.1°F | Wt 207.2 lb

## 2023-02-25 DIAGNOSIS — F1010 Alcohol abuse, uncomplicated: Secondary | ICD-10-CM | POA: Diagnosis not present

## 2023-02-25 DIAGNOSIS — Z0000 Encounter for general adult medical examination without abnormal findings: Secondary | ICD-10-CM

## 2023-02-25 DIAGNOSIS — E7800 Pure hypercholesterolemia, unspecified: Secondary | ICD-10-CM | POA: Diagnosis not present

## 2023-02-25 DIAGNOSIS — R7989 Other specified abnormal findings of blood chemistry: Secondary | ICD-10-CM | POA: Diagnosis not present

## 2023-02-25 DIAGNOSIS — E6609 Other obesity due to excess calories: Secondary | ICD-10-CM

## 2023-02-25 DIAGNOSIS — Z23 Encounter for immunization: Secondary | ICD-10-CM | POA: Diagnosis not present

## 2023-02-25 DIAGNOSIS — Z6830 Body mass index (BMI) 30.0-30.9, adult: Secondary | ICD-10-CM

## 2023-02-25 DIAGNOSIS — I10 Essential (primary) hypertension: Secondary | ICD-10-CM

## 2023-02-25 DIAGNOSIS — Z6832 Body mass index (BMI) 32.0-32.9, adult: Secondary | ICD-10-CM

## 2023-02-25 DIAGNOSIS — N401 Enlarged prostate with lower urinary tract symptoms: Secondary | ICD-10-CM

## 2023-02-25 DIAGNOSIS — N522 Drug-induced erectile dysfunction: Secondary | ICD-10-CM

## 2023-02-25 DIAGNOSIS — R350 Frequency of micturition: Secondary | ICD-10-CM

## 2023-02-25 DIAGNOSIS — E66811 Obesity, class 1: Secondary | ICD-10-CM

## 2023-02-25 MED ORDER — TADALAFIL 10 MG PO TABS: 10.0000 mg | ORAL_TABLET | ORAL | 1 refills | Status: AC | PRN

## 2023-02-25 MED ORDER — AMLODIPINE BESYLATE 10 MG PO TABS: 10.0000 mg | ORAL_TABLET | Freq: Every day | ORAL | 4 refills | Status: AC

## 2023-02-25 NOTE — Assessment & Plan Note (Signed)
Ongoing, noted on labs with ASCVD 12.5%.  Discussed at length with him today and recommend statin therapy.  He wishes to talk to his son first, who is a Engineer, civil (consulting) working towards NP.  Will let us know if agreeable to starting, would start Rosuvastatin 10 MG and increase as needed.

## 2023-02-25 NOTE — Assessment & Plan Note (Signed)
Chronic, ongoing with BP at goal today.  Recommend he monitor BP at least a few mornings a week at home and document.  DASH diet at home.  Continue current medication regimen and adjust as needed.  Labs today: CBC, CMP, TSH.  Refills sent.  Return in 6 months.

## 2023-02-25 NOTE — Progress Notes (Signed)
BP 120/67 (BP Location: Left Arm, Cuff Size: Normal)   Pulse (!) 58   Temp 98.1 F (36.7 C) (Oral)   Wt 207 lb 3.2 oz (94 kg)   SpO2 97%   BMI 32.45 kg/m    Subjective:    Patient ID: Craig Finley, male    DOB: Mar 25, 1961, 62 y.o.   MRN: [id number]  HPI: Craig Finley is a 62 y.o. male presenting on 02/25/2023 for comprehensive medical examination. Current medical complaints include:none  He currently lives with: Interim Problems from his last visit: no  HYPERTENSION without Chronic Kidney Disease Continues on Amlodipine.  Continues to drink alcohol, on average maybe 1-2 beer a day. History of elevation in liver function, has been stable for some time.  Takes Cialis 5 MG for ED, but does not feel like this works. Hypertension status: stable  Satisfied with current treatment? yes Duration of hypertension: chronic BP monitoring frequency:  rarely BP range:  BP medication side effects:  no Medication compliance: good compliance Aspirin: no Recurrent headaches: no Visual changes: no Palpitations: no Dyspnea: no Chest pain: no Lower extremity edema: no Dizzy/lightheaded: no  The 10-year ASCVD risk score (Arnett DK, et al., 2019) is: 12.5%   Values used to calculate the score:     Age: 1 years     Sex: Male     Is Non-Hispanic African American: Yes     Diabetic: No     Tobacco smoker: No     Systolic Blood Pressure: 120 mmHg     Is BP treated: Yes     HDL Cholesterol: 53 mg/dL     Total Cholesterol: 193 mg/dL  Functional Status Survey: Is the patient deaf or have difficulty hearing?: No Does the patient have difficulty seeing, even when wearing glasses/contacts?: No Does the patient have difficulty concentrating, remembering, or making decisions?: No Does the patient have difficulty walking or climbing stairs?: No Does the patient have difficulty dressing or bathing?: No Does the patient have difficulty doing errands alone such as visiting a  doctor's office or shopping?: No  FALL RISK:    02/25/2023    8:28 AM 08/12/2022    8:24 AM 07/09/2022    2:23 PM 06/11/2022    2:39 PM 02/19/2022   10:13 AM  Fall Risk   Falls in the past year? 0 0 0 0 0  Number falls in past yr: 0 0 0 0 0  Injury with Fall? 0 0 0 0 0  Risk for fall due to : No Fall Risks No Fall Risks No Fall Risks No Fall Risks No Fall Risks  Follow up Falls evaluation completed Falls evaluation completed Falls evaluation completed Falls evaluation completed Falls evaluation completed    Depression Screen    02/25/2023    8:28 AM 08/12/2022    8:25 AM 07/09/2022    2:24 PM 06/11/2022    2:39 PM 02/19/2022   10:13 AM  Depression screen PHQ 2/9  Decreased Interest 0 0 0 0 0  Down, Depressed, Hopeless 0 0 0 0 0  PHQ - 2 Score 0 0 0 0 0  Altered sleeping 0 0 0 0 0  Tired, decreased energy 0 0 0 0 0  Change in appetite 0 0 0 0 0  Feeling bad or failure about yourself  0 0 0 0 0  Trouble concentrating 0 0 0 0 0  Moving slowly or fidgety/restless 0 0 0 0 0  Suicidal thoughts 0 0 0  0 0  PHQ-9 Score 0 0 0 0 0  Difficult doing work/chores Not difficult at all Not difficult at all Not difficult at all Not difficult at all Not difficult at all      02/25/2023    8:28 AM 08/12/2022    8:25 AM 07/09/2022    2:24 PM 06/11/2022    2:39 PM  GAD 7 : Generalized Anxiety Score  Nervous, Anxious, on Edge 0 0 0 0  Control/stop worrying 0 0 0 0  Worry too much - different things 0 0 0 0  Trouble relaxing 0 0 0 0  Restless 0 0 0 0  Easily annoyed or irritable 0 0 0 0  Afraid - awful might happen 0 0 0 0  Total GAD 7 Score 0 0 0 0  Anxiety Difficulty Not difficult at all  Not difficult at all Not difficult at all   Past Medical History:  Past Medical History:  Diagnosis Date   Allergy    Blood in stool    Chest pain    a. 11/2019 Cor CTA: Ca2 = 0. Nl cors.   CKD (chronic kidney disease) stage 2, GFR 60-89 ml/min    GERD (gastroesophageal reflux disease)    Heart murmur     as a child:   Hypertension    Pulmonary nodules    a. 11/2019 Cor CTA: Small L lung nodules - largest 4mm - incidental finding; b. 12/2020 Chest CT: Sm bilat pulm nodules - unchanged.    Surgical History:  Past Surgical History:  Procedure Laterality Date   ANAL FISSURE REPAIR  05/1998   some type of anal fissure procedure   ARTHOSCOPIC ROTAOR CUFF REPAIR Right 04/23/2021   Procedure: Right ARTHROSCOPIC ROTATOR CUFF REPAIR;  Surgeon: Huel Cote, MD;  Location: [facility] OR;  Service: Orthopedics;  Laterality: Right;   COLONOSCOPY WITH PROPOFOL N/A 01/31/2020   Procedure: COLONOSCOPY WITH PROPOFOL;  Surgeon: Toney Reil, MD;  Location: [facility] ENDOSCOPY;  Service: Gastroenterology;  Laterality: N/A;   ESOPHAGOGASTRODUODENOSCOPY (EGD) WITH PROPOFOL N/A 01/31/2020   Procedure: ESOPHAGOGASTRODUODENOSCOPY (EGD) WITH PROPOFOL;  Surgeon: Toney Reil, MD;  Location: [facility] ENDOSCOPY;  Service: Gastroenterology;  Laterality: N/A;    Medications:  Current Outpatient Medications on File Prior to Visit  Medication Sig   albuterol (VENTOLIN HFA) 108 (90 Base) MCG/ACT inhaler Inhale 2 puffs into the lungs every 6 (six) hours as needed for wheezing or shortness of breath.   Fexofenadine-Pseudoephedrine (ALLEGRA-D PO) Take 1 tablet by mouth daily as needed (allergies).   No current facility-administered medications on file prior to visit.    Allergies:  No Known Allergies  Social History:  Social History   Socioeconomic History   Marital status: Married    Spouse name: Not on file   Number of children: Not on file   Years of education: Not on file   Highest education level: Some college, no degree  Occupational History   Occupation: trucker  Tobacco Use   Smoking status: Former    Current packs/day: 0.00    Average packs/day: 1 pack/day for 25.0 years (25.0 ttl pk-yrs)    Types: Cigarettes    Quit date: 04/18/2021    Years since quitting: 1.8   Smokeless tobacco: Former     Types: Snuff    Quit date: 04/02/2021   Tobacco comments:    I was doing snuff tobacco  Vaping Use   Vaping status: Never Used  Substance and Sexual Activity   Alcohol use:  Yes    Comment: occasional   Drug use: Never   Sexual activity: Yes    Birth control/protection: None  Other Topics Concern   Not on file  Social History Narrative   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    Social Determinants of Health   Financial Resource Strain: Low Risk  (02/18/2023)   Overall Financial Resource Strain (CARDIA)    Difficulty of Paying Living Expenses: Not very hard  Food Insecurity: No Food Insecurity (02/18/2023)   Hunger Vital Sign    Worried About Running Out of Food in the Last Year: Never true    Ran Out of Food in the Last Year: Never true  Transportation Needs: No Transportation Needs (02/18/2023)   PRAPARE - Administrator, Civil Service (Medical): No    Lack of Transportation (Non-Medical): No  Physical Activity: Unknown (02/18/2023)   Exercise Vital Sign    Days of Exercise per Week: 0 days    Minutes of Exercise per Session: Not on file  Stress: No Stress Concern Present (02/18/2023)   [employer] of Occupational Health - Occupational Stress Questionnaire    Feeling of Stress : Not at all  Social Connections: Socially Integrated (02/18/2023)   Social Connection and Isolation Panel [NHANES]    Frequency of Communication with Friends and Family: More than three times a week    Frequency of Social Gatherings with Friends and Family: Once a week    Attends Religious Services: 1 to 4 times per year    Active Member of [employer] or Organizations: No    Attends Engineer, structural: More than 4 times per year    Marital Status: Married  Catering manager Violence: Unknown (08/15/2021)   Received from [employer], [facility]   HITS    Physically Hurt: Not on file    Insult or Talk Down To: Not on file    Threaten Physical Harm: Not on  file    Scream or Curse: Not on file   Social History   Tobacco Use  Smoking Status Former   Current packs/day: 0.00   Average packs/day: 1 pack/day for 25.0 years (25.0 ttl pk-yrs)   Types: Cigarettes   Quit date: 04/18/2021   Years since quitting: 1.8  Smokeless Tobacco Former   Types: Snuff   Quit date: 04/02/2021  Tobacco Comments   I was doing snuff tobacco   Social History   Substance and Sexual Activity  Alcohol Use Yes   Comment: occasional    Family History:  Family History  Problem Relation Age of Onset   Arthritis Mother    Asthma Mother    Hypertension Mother    Hearing loss Father    Alcohol abuse Sister    Cancer Sister    Depression Sister    Drug abuse Sister    Early death Sister    Heart attack Sister    Hypertension Sister    Miscarriages / Stillbirths Sister    Asthma Sister    Depression Sister    Heart disease Maternal Uncle    Heart disease Maternal Uncle    Alcohol abuse Maternal Grandmother    Cancer Maternal Grandmother    Cancer Maternal Grandfather    Asthma Daughter    Asthma Son     Past medical history, surgical history, medications, allergies, family history and social history reviewed with patient today and changes made to appropriate areas of the chart.   ROS All other  ROS negative except what is listed above and in the HPI.      Objective:    BP 120/67 (BP Location: Left Arm, Cuff Size: Normal)   Pulse (!) 58   Temp 98.1 F (36.7 C) (Oral)   Wt 207 lb 3.2 oz (94 kg)   SpO2 97%   BMI 32.45 kg/m   Wt Readings from Last 3 Encounters:  02/25/23 207 lb 3.2 oz (94 kg)  08/12/22 209 lb 12.8 oz (95.2 kg)  07/09/22 203 lb 14.4 oz (92.5 kg)    Physical Exam Vitals and nursing note reviewed.  Constitutional:      General: He is awake. He is not in acute distress.    Appearance: He is well-developed and well-groomed. He is not ill-appearing or toxic-appearing.  HENT:     Head: Normocephalic and atraumatic.     Right  Ear: Hearing, tympanic membrane, ear canal and external ear normal. No drainage.     Left Ear: Hearing, tympanic membrane, ear canal and external ear normal. No drainage.     Nose: Nose normal.     Mouth/Throat:     Pharynx: Uvula midline.  Eyes:     General: Lids are normal.        Right eye: No discharge.        Left eye: No discharge.     Extraocular Movements: Extraocular movements intact.     Conjunctiva/sclera: Conjunctivae normal.     Pupils: Pupils are equal, round, and reactive to light.     Visual Fields: Right eye visual fields normal and left eye visual fields normal.  Neck:     Thyroid: No thyromegaly.     Vascular: No carotid bruit or JVD.     Trachea: Trachea normal.  Cardiovascular:     Rate and Rhythm: Normal rate and regular rhythm.     Heart sounds: Normal heart sounds, S1 normal and S2 normal. No murmur heard.    No gallop.  Pulmonary:     Effort: Pulmonary effort is normal. No accessory muscle usage or respiratory distress.     Breath sounds: Normal breath sounds.  Abdominal:     General: Bowel sounds are normal.     Palpations: Abdomen is soft. There is no hepatomegaly or splenomegaly.     Tenderness: There is no abdominal tenderness.  Musculoskeletal:        General: Normal range of motion.     Cervical back: Normal range of motion and neck supple.     Right lower leg: No edema.     Left lower leg: No edema.  Lymphadenopathy:     Head:     Right side of head: No submental, submandibular, tonsillar, preauricular or posterior auricular adenopathy.     Left side of head: No submental, submandibular, tonsillar, preauricular or posterior auricular adenopathy.     Cervical: No cervical adenopathy.  Skin:    General: Skin is warm and dry.     Capillary Refill: Capillary refill takes less than 2 seconds.     Findings: No rash.  Neurological:     Mental Status: He is alert and oriented to person, place, and time.     Gait: Gait is intact.     Deep Tendon  Reflexes: Reflexes are normal and symmetric.     Reflex Scores:      Brachioradialis reflexes are 2+ on the right side and 2+ on the left side.      Patellar reflexes are 2+ on the  right side and 2+ on the left side. Psychiatric:        Attention and Perception: Attention normal.        Mood and Affect: Mood normal.        Speech: Speech normal.        Behavior: Behavior normal. Behavior is cooperative.        Thought Content: Thought content normal.        Cognition and Memory: Cognition normal.      Results for orders placed or performed in visit on 08/12/22  Basic metabolic panel  Result Value Ref Range   Glucose 94 70 - 99 mg/dL   BUN 11 8 - 27 mg/dL   Creatinine, Ser 1.61 (H) 0.76 - 1.27 mg/dL   eGFR 58 (L) >09 UE/AVW/0.98   BUN/Creatinine Ratio 8 (L) 10 - 24   Sodium 140 134 - 144 mmol/L   Potassium 4.3 3.5 - 5.2 mmol/L   Chloride 100 96 - 106 mmol/L   CO2 23 20 - 29 mmol/L   Calcium 9.1 8.6 - 10.2 mg/dL      Assessment & Plan:   Problem List Items Addressed This Visit       Cardiovascular and Mediastinum   Essential hypertension - Primary    Chronic, ongoing with BP at goal today.  Recommend he monitor BP at least a few mornings a week at home and document.  DASH diet at home.  Continue current medication regimen and adjust as needed.  Labs today: CBC, CMP, TSH.  Refills sent.  Return in 6 months.       Relevant Medications   amLODipine (NORVASC) 10 MG tablet   tadalafil (CIALIS) 10 MG tablet   Other Relevant Orders   CBC with Differential/Platelet   TSH     Other   Benign prostatic hyperplasia with urinary frequency    Check PSA today.  Currently denies symptoms.      Relevant Orders   PSA   ED (erectile dysfunction)    Chronic, ongoing. Feels Cialis 5 MG does not work, will trial increase to 10 MG Q48hrs as needed to see if improvement.  Discussed with patient.      Elevated LFTs    Overall stable for several months, recheck today and cut back on  alcohol use.      Relevant Orders   Comprehensive metabolic panel   Elevated low density lipoprotein (LDL) cholesterol level    Ongoing, noted on labs with ASCVD 12.5%.  Discussed at length with him today and recommend statin therapy.  He wishes to talk to his son first, who is a Engineer, civil (consulting) working towards NP.  Will let us know if agreeable to starting, would start Rosuvastatin 10 MG and increase as needed.      Relevant Orders   Comprehensive metabolic panel   Lipid Panel w/o Chol/HDL Ratio   ETOH abuse    Stable, denies heavy alcohol use.  Recommend he cut back some and avoid daily use.  Labs today.      Relevant Orders   Comprehensive metabolic panel   Obesity    BMI 32.45.  Recommended eating smaller high protein, low fat meals more frequently and exercising 30 mins a day 5 times a week with a goal of 10-15lb weight loss in the next 3 months. Patient voiced their understanding and motivation to adhere to these recommendations.       Other Visit Diagnoses     Flu vaccine need  Flu vaccine today, educated on this.   Relevant Orders   Flu vaccine trivalent PF, 6mos and older(Flulaval,Afluria,Fluarix,Fluzone) (Completed)   Encounter for annual physical exam       Annual physical today with labs and health maintenance reviewed, discussed with patient.       Discussed aspirin prophylaxis for myocardial infarction prevention and decision was it was not indicated  LABORATORY TESTING:  Health maintenance labs ordered today as discussed above.   The natural history of prostate cancer and ongoing controversy regarding screening and potential treatment outcomes of prostate cancer has been discussed with the patient. The meaning of a false positive PSA and a false negative PSA has been discussed. He indicates understanding of the limitations of this screening test and wishes to proceed with screening PSA testing.   IMMUNIZATIONS:   - Tdap: Tetanus vaccination status reviewed: last  tetanus booster within 10 years. - Influenza: Up to date - Pneumovax: Not applicable - Prevnar: Not applicable - Zostavax vaccine: Up to date - Covid: has had 3 vaccines  SCREENING: - Colonoscopy: Up to date  Discussed with patient purpose of the colonoscopy is to detect colon cancer at curable precancerous or early stages   - AAA Screening: Not applicable - quit dipping January 29th, 2023 -Hearing Test: Not applicable  -Spirometry: Not applicable   PATIENT COUNSELING:    Sexuality: Discussed sexually transmitted diseases, partner selection, use of condoms, avoidance of unintended pregnancy  and contraceptive alternatives.   Advised to avoid cigarette smoking.  I discussed with the patient that most people either abstain from alcohol or drink within safe limits (<=14/week and <=4 drinks/occasion for males, <=7/weeks and <= 3 drinks/occasion for females) and that the risk for alcohol disorders and other health effects rises proportionally with the number of drinks per week and how often a drinker exceeds daily limits.  Discussed cessation/primary prevention of drug use and availability of treatment for abuse.   Diet: Encouraged to adjust caloric intake to maintain  or achieve ideal body weight, to reduce intake of dietary saturated fat and total fat, to limit sodium intake by avoiding high sodium foods and not adding table salt, and to maintain adequate dietary potassium and calcium preferably from fresh fruits, vegetables, and low-fat dairy products.    Stressed the importance of regular exercise  Injury prevention: Discussed safety belts, safety helmets, smoke detector, smoking near bedding or upholstery.   Dental health: Discussed importance of regular tooth brushing, flossing, and dental visits.   Follow up plan: NEXT PREVENTATIVE PHYSICAL DUE IN 1 YEAR. Return in about 6 months (around 08/26/2023) for HTN/HLD.

## 2023-02-25 NOTE — Assessment & Plan Note (Signed)
Chronic, ongoing. Feels Cialis 5 MG does not work, will trial increase to 10 MG Q48hrs as needed to see if improvement.  Discussed with patient.

## 2023-02-25 NOTE — Assessment & Plan Note (Signed)
Stable, denies heavy alcohol use.  Recommend he cut back some and avoid daily use.  Labs today.

## 2023-02-25 NOTE — Assessment & Plan Note (Signed)
Check PSA today.  Currently denies symptoms.

## 2023-02-25 NOTE — Assessment & Plan Note (Signed)
Overall stable for several months, recheck today and cut back on alcohol use.

## 2023-02-25 NOTE — Assessment & Plan Note (Signed)
BMI 32.45.  Recommended eating smaller high protein, low fat meals more frequently and exercising 30 mins a day 5 times a week with a goal of 10-15lb weight loss in the next 3 months. Patient voiced their understanding and motivation to adhere to these recommendations.

## 2023-02-26 LAB — CBC WITH DIFFERENTIAL/PLATELET
Basophils Absolute: 0 10*3/uL (ref 0.0–0.2)
Basos: 1 %
EOS (ABSOLUTE): 0.3 10*3/uL (ref 0.0–0.4)
Eos: 6 %
Hematocrit: 46.9 % (ref 37.5–51.0)
Hemoglobin: 15.7 g/dL (ref 13.0–17.7)
Immature Grans (Abs): 0 10*3/uL (ref 0.0–0.1)
Immature Granulocytes: 0 %
Lymphocytes Absolute: 1.3 10*3/uL (ref 0.7–3.1)
Lymphs: 27 %
MCH: 29.4 pg (ref 26.6–33.0)
MCHC: 33.5 g/dL (ref 31.5–35.7)
MCV: 88 fL (ref 79–97)
Monocytes Absolute: 0.4 10*3/uL (ref 0.1–0.9)
Monocytes: 8 %
Neutrophils Absolute: 2.9 10*3/uL (ref 1.4–7.0)
Neutrophils: 58 %
Platelets: 216 10*3/uL (ref 150–450)
RBC: 5.34 x10E6/uL (ref 4.14–5.80)
RDW: 12.8 % (ref 11.6–15.4)
WBC: 4.9 10*3/uL (ref 3.4–10.8)

## 2023-02-26 LAB — COMPREHENSIVE METABOLIC PANEL
ALT: 20 [IU]/L (ref 0–44)
AST: 23 [IU]/L (ref 0–40)
Albumin: 4.1 g/dL (ref 3.9–4.9)
Alkaline Phosphatase: 63 [IU]/L (ref 44–121)
BUN/Creatinine Ratio: 8 — ABNORMAL LOW (ref 10–24)
BUN: 10 mg/dL (ref 8–27)
Bilirubin Total: 0.6 mg/dL (ref 0.0–1.2)
CO2: 26 mmol/L (ref 20–29)
Calcium: 9.5 mg/dL (ref 8.6–10.2)
Chloride: 102 mmol/L (ref 96–106)
Creatinine, Ser: 1.22 mg/dL (ref 0.76–1.27)
Globulin, Total: 2.7 g/dL (ref 1.5–4.5)
Glucose: 98 mg/dL (ref 70–99)
Potassium: 4.2 mmol/L (ref 3.5–5.2)
Sodium: 140 mmol/L (ref 134–144)
Total Protein: 6.8 g/dL (ref 6.0–8.5)
eGFR: 67 mL/min/{1.73_m2} (ref >59)

## 2023-02-26 LAB — TSH: TSH: 2.88 u[IU]/mL (ref 0.450–4.500)

## 2023-02-26 LAB — LIPID PANEL W/O CHOL/HDL RATIO
Cholesterol, Total: 203 mg/dL — ABNORMAL HIGH (ref 100–199)
HDL: 60 mg/dL (ref >39)
LDL Chol Calc (NIH): 124 mg/dL — ABNORMAL HIGH (ref 0–99)
Triglycerides: 107 mg/dL (ref 0–149)
VLDL Cholesterol Cal: 19 mg/dL (ref 5–40)

## 2023-02-26 LAB — PSA: Prostate Specific Ag, Serum: 2.6 ng/mL (ref 0.0–4.0)

## 2023-02-26 NOTE — Progress Notes (Signed)
Contacted via MyChart   Good morning Craig Finley, your labs have returned and overall are stable with exception of cholesterol and LDL (bad cholesterol) -- as we discussed yesterday I do recommend starting a statin to lower levels and help prevent stroke or heart event.  You discuss with your son and let me know decision on this.  Any questions? Keep being stellar!!  Thank you for allowing me to participate in your care.  I appreciate you. Kindest regards, Jaydence Arnesen

## 2023-03-13 IMAGING — DX DG SHOULDER 2+V*R*
3 series · 3 of 3 positions shown · non-contrast
Comparison: None.

CLINICAL DATA: Pain

EXAM:
RIGHT SHOULDER - 2+ VIEW

[shoulder y-view]
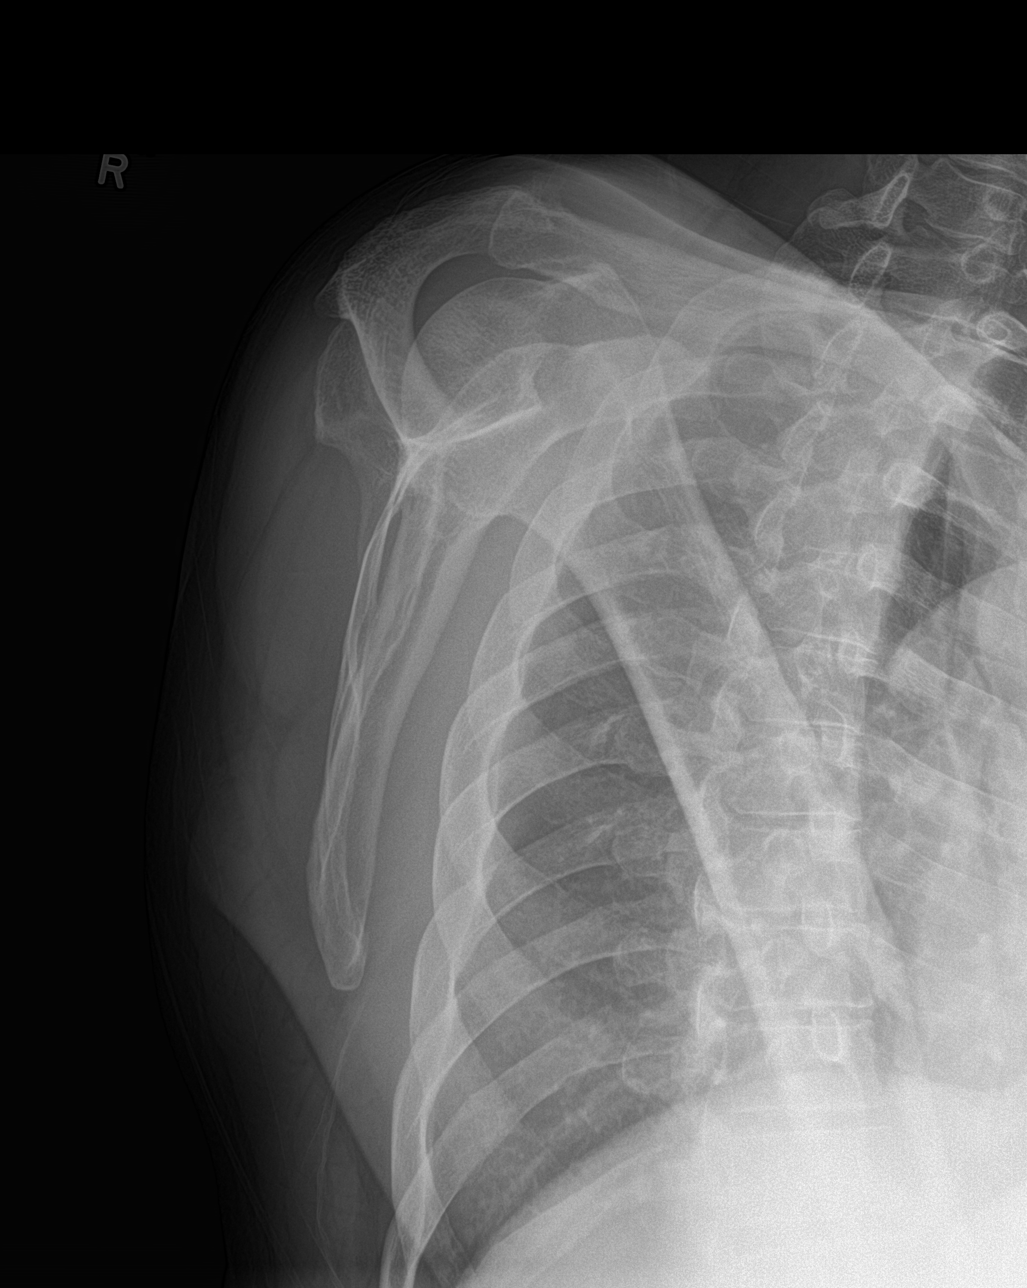

[shoulder axial]
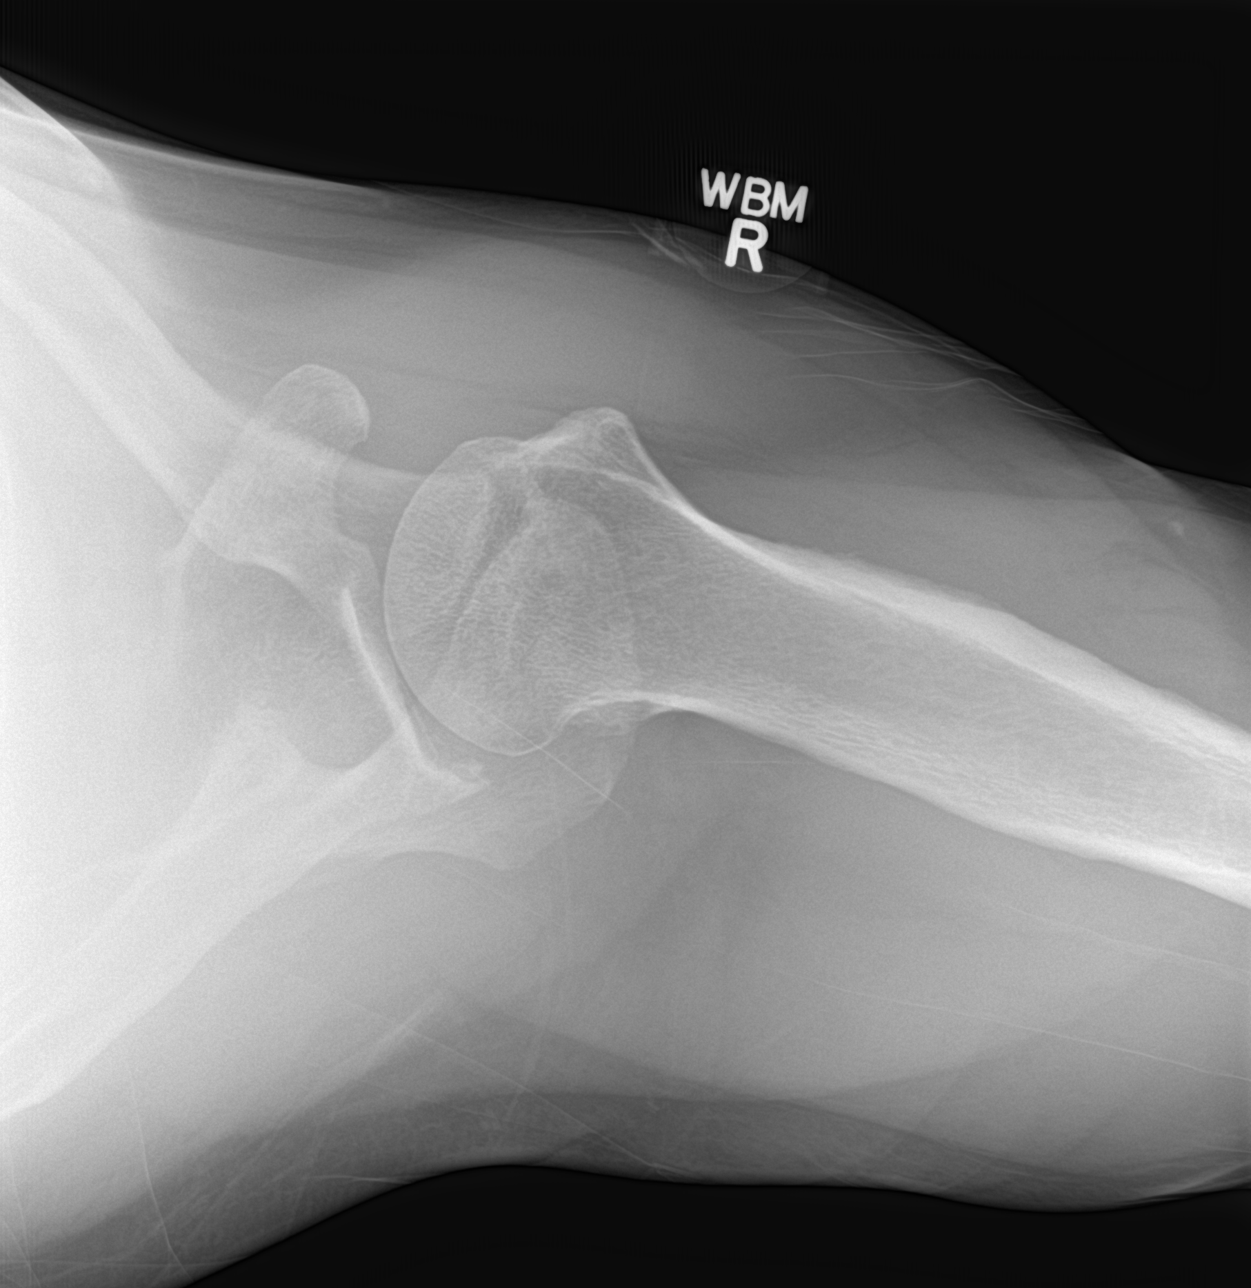

[shoulder ap]
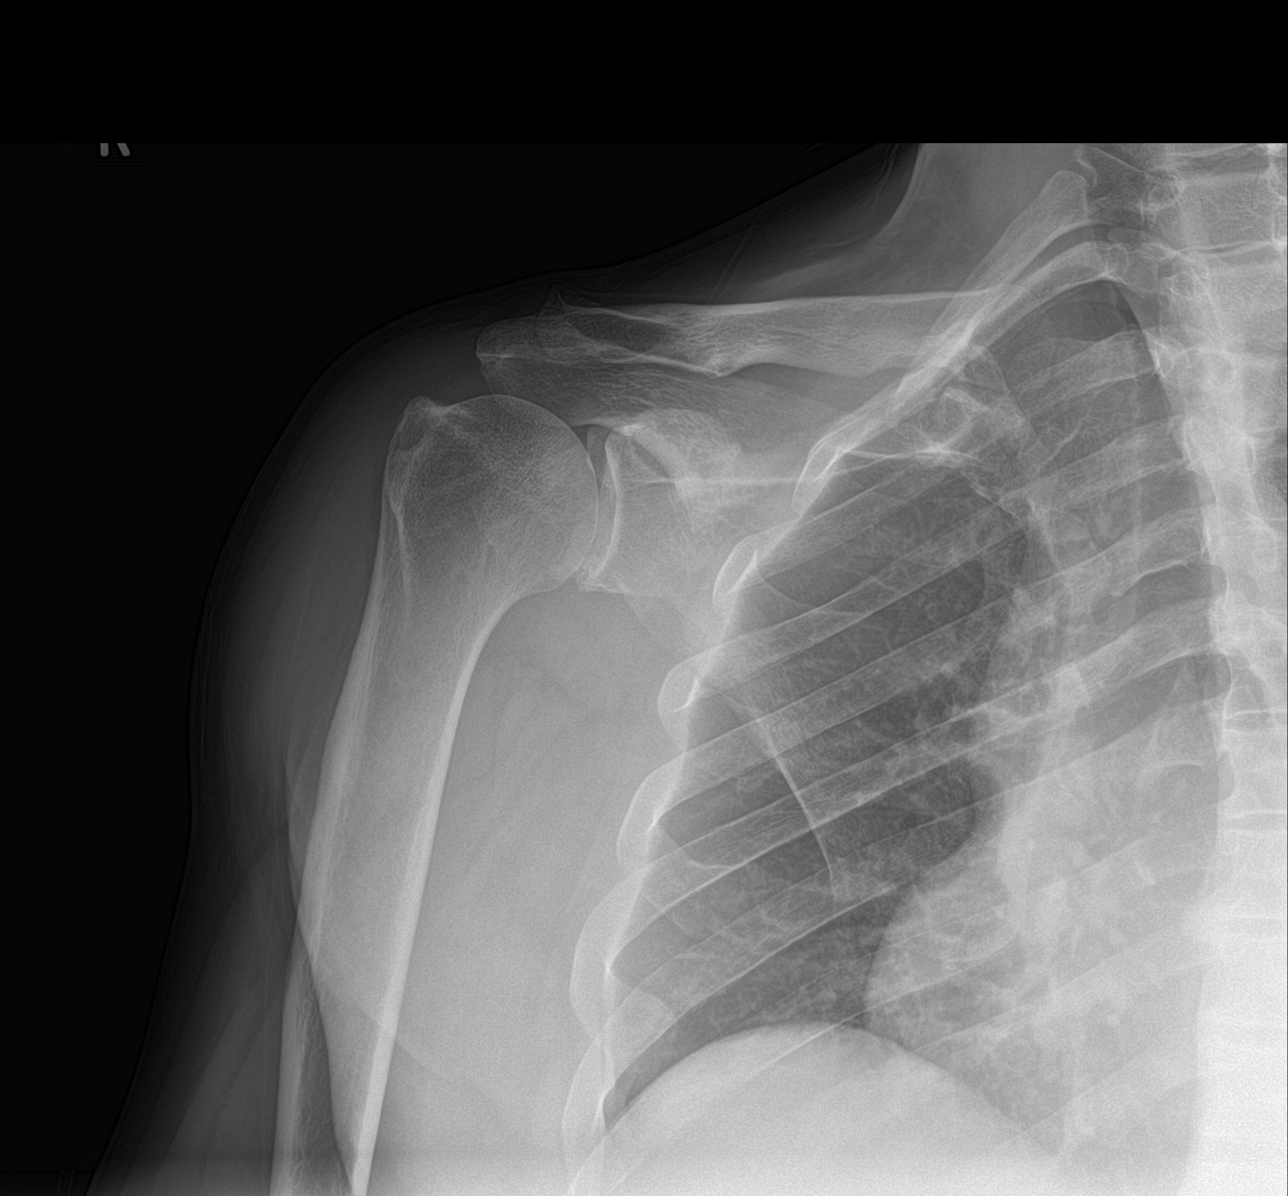

[3 of 3 positions shown; findings below may reference images not displayed]

FINDINGS: No fracture or dislocation of the right shoulder. Mild
acromioclavicular and glenohumeral arthrosis. The partially imaged
right chest is unremarkable.
IMPRESSION: No fracture or dislocation of the right shoulder. Mild
acromioclavicular and glenohumeral arthrosis.

## 2023-05-03 IMAGING — MR MR SHOULDER*R* W/O CM
4 of 5 series · 26 of 40 positions shown · non-contrast
Comparison: X-ray shoulder 01/09/2021.

CLINICAL DATA: Right shoulder pain with weakness and limited range
of motion due to a softball injury [DATE]. Assess for labral tear.

EXAM:
MRI OF THE RIGHT SHOULDER WITHOUT CONTRAST
TECHNIQUE: Multiplanar, multisequence MR imaging of the shoulder was performed.
No intravenous contrast was administered.

[Series 3: T2 fat-sat · axial · 4.0mm · 0.27mm/px · z∈[-102,+35]mm · 9 of 30 slices shown (1 of 3)]
[im 1/30]
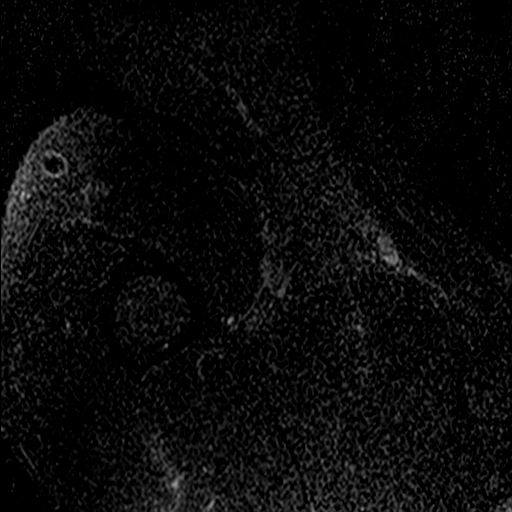
[im 6/30]
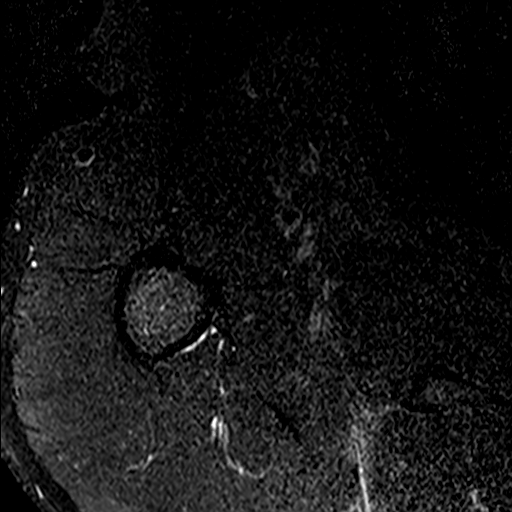
[im 8/30]
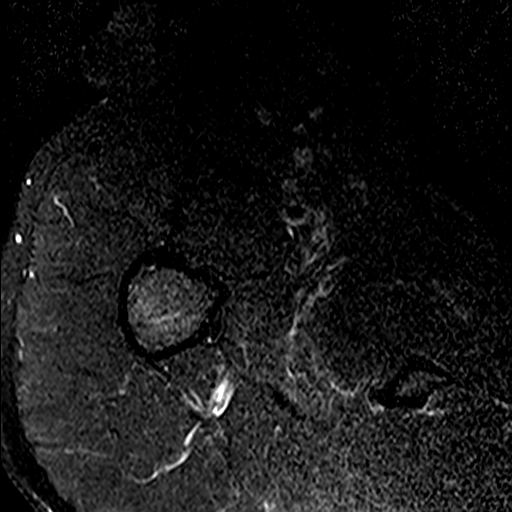
[im 14/30]
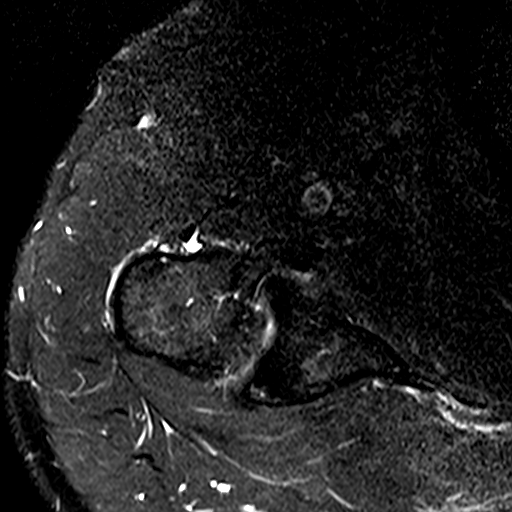
[im 16/30]
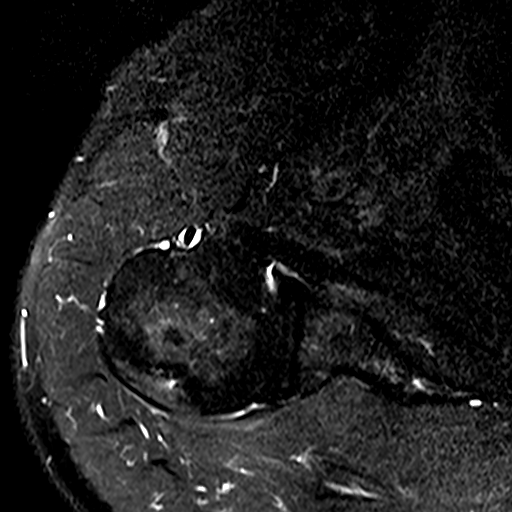
[im 22/30]
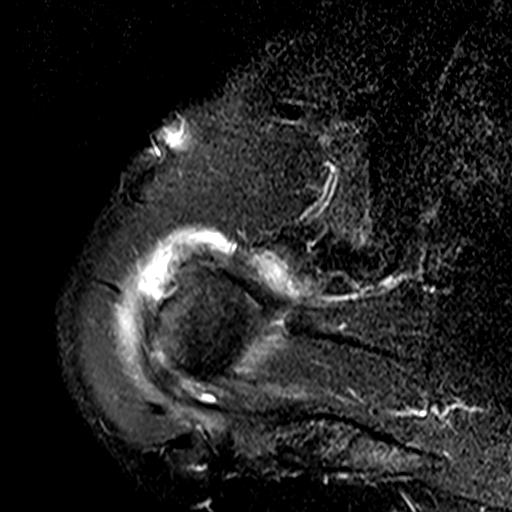
[im 24/30]
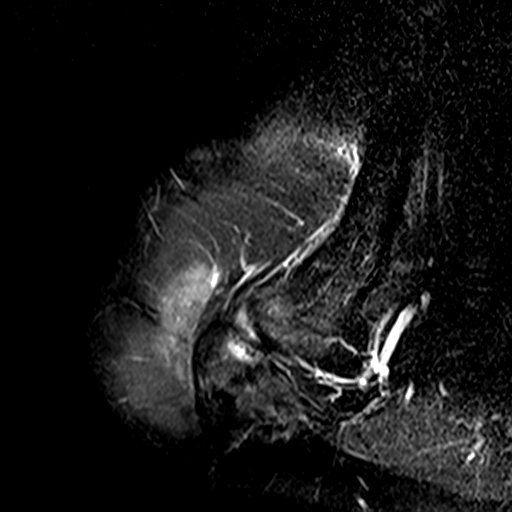
[im 27/30]
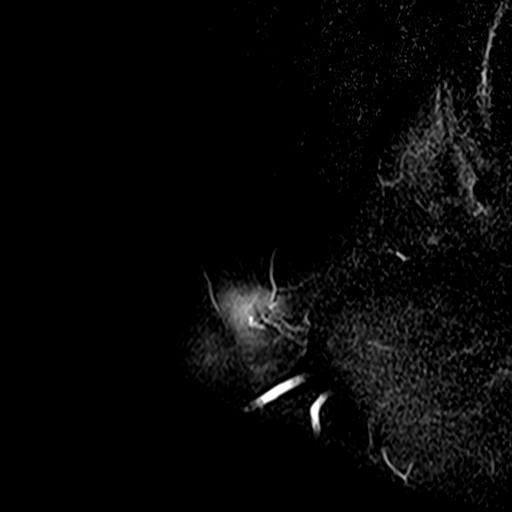
[im 30/30]
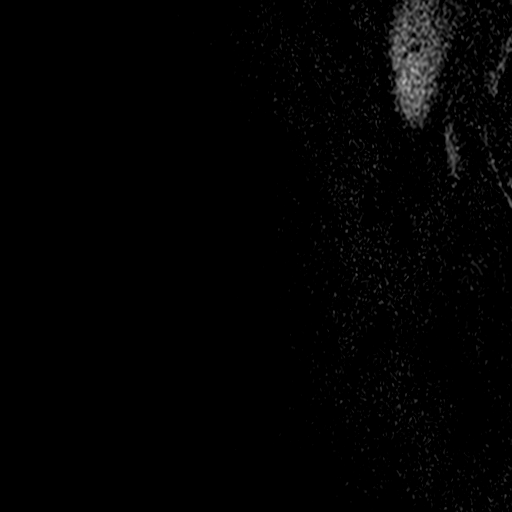

[Series 4: T2 fat-sat · oblique · 4.0mm · 0.55mm/px · 7 of 18 slices shown (2 of 3)]
[im 1/18]
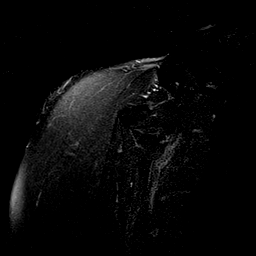
[im 3/18]
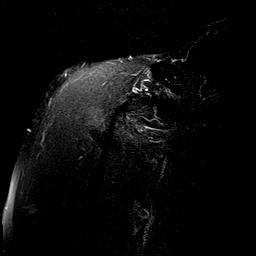
[im 6/18]
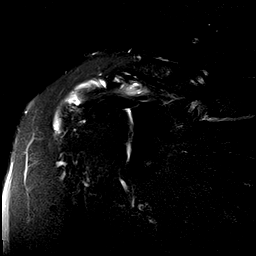
[im 9/18]
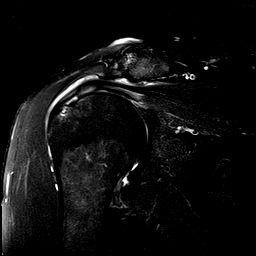
[im 12/18]
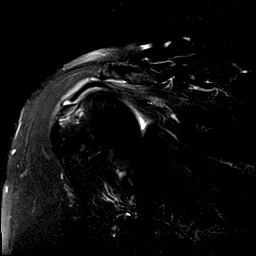
[im 15/18]
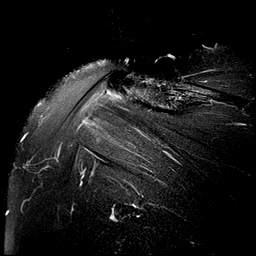
[im 18/18]
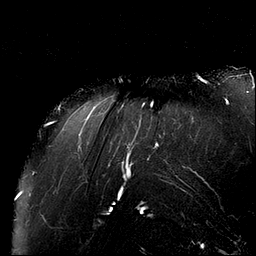

[Series 5: PD · oblique · 4.0mm · 0.27mm/px · 7 of 18 slices shown]
[im 1/18]
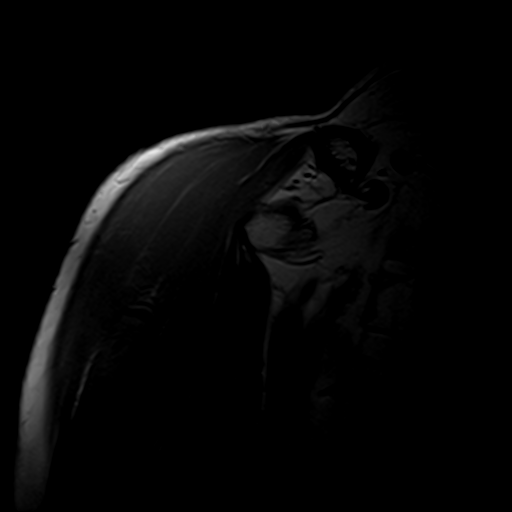
[im 3/18]
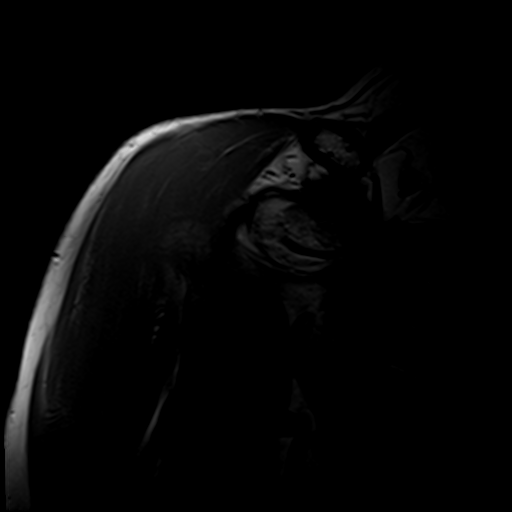
[im 6/18]
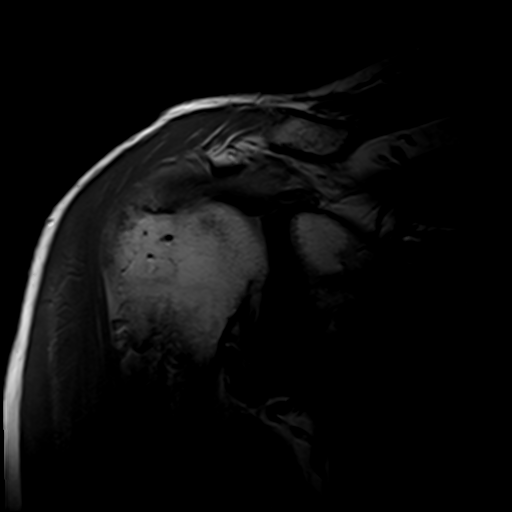
[im 9/18]
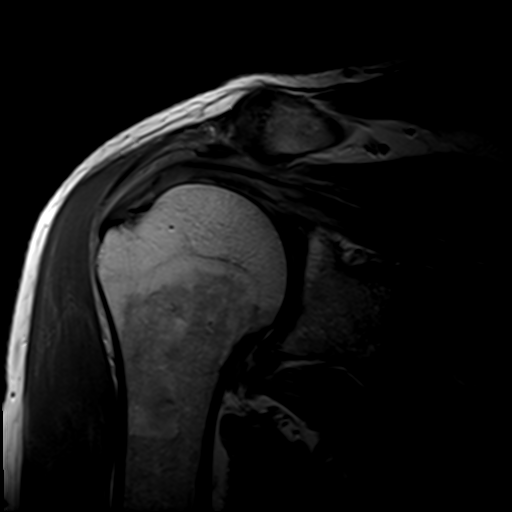
[im 12/18]
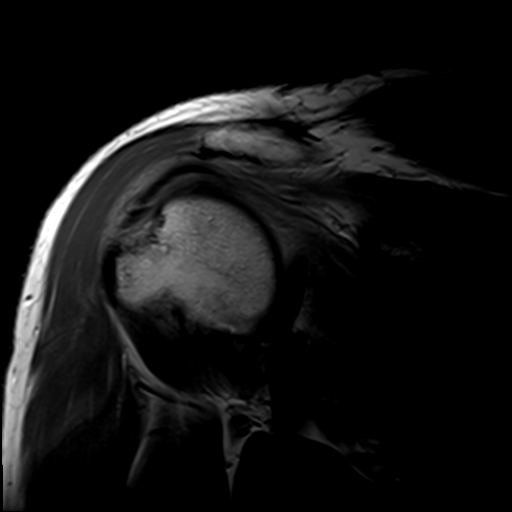
[im 15/18]
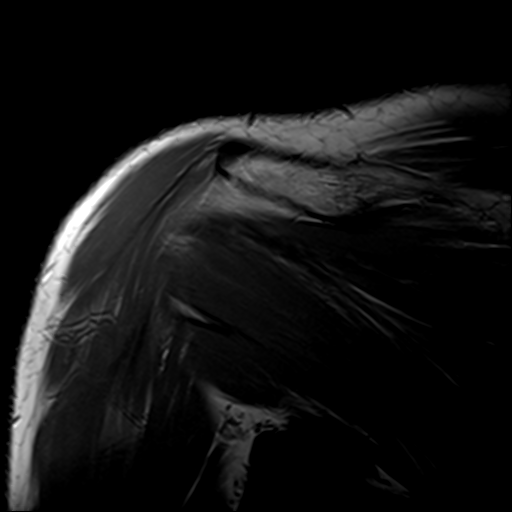
[im 18/18]
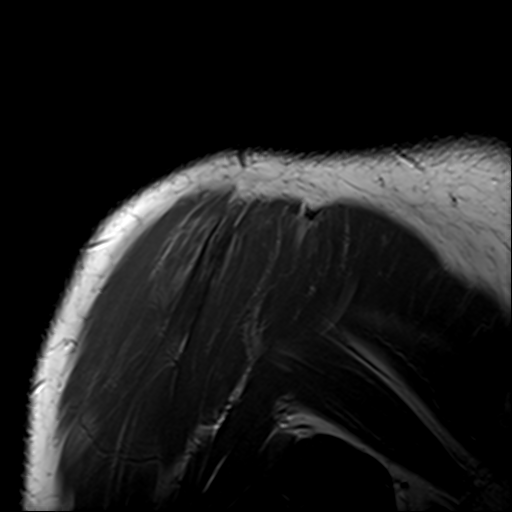

[Series 6: T2 fat-sat · oblique · 4.0mm · 0.55mm/px · 3 of 19 slices shown (3 of 3)]
[im 4/19]
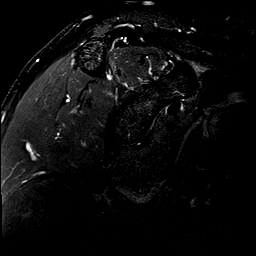
[im 10/19]
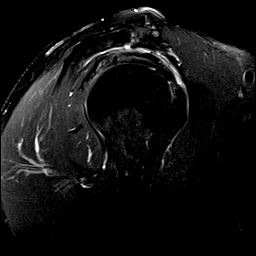
[im 16/19]
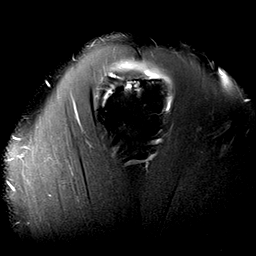

[26 of 40 positions shown; findings below may reference images not displayed]

FINDINGS: Rotator cuff: Full-thickness, nearly full-width tear of the distal
supraspinatus tendon anteriorly with 6 mm of tendon retraction. Tear
measures 16 mm in AP dimension. High-grade articular sided tears of
the posterior infraspinatus tendon insertion. Additional
interstitial tear of the infraspinatus tendon extending to the
myotendinous junction. Background of mild-moderate rotator cuff
tendinosis. Subscapularis and teres minor tendons intact.

Muscles: Preserved bulk and signal intensity of the rotator cuff
musculature without edema, atrophy, or fatty infiltration.

Biceps long head: Intact and normally positioned.

Acromioclavicular Joint: Moderate arthropathy of the AC joint. Small
volume subacromial-subdeltoid bursal fluid communicating with the
glenohumeral joint.

Glenohumeral Joint: Mild glenohumeral chondral thinning. Trace joint
effusion.

Labrum:  Grossly intact.  No paralabral cyst.

Bones:  No acute fracture.  No dislocation.  No focal bone lesion.

Other: None.
IMPRESSION: 1. Full-thickness, nearly full-width tear of the distal
supraspinatus tendon with mild retraction.
2. High-grade articular sided tears of the posterior infraspinatus
tendon insertion. Additional interstitial tear of the infraspinatus
tendon extending to the myotendinous junction.
3. Moderate acromioclavicular and mild glenohumeral joint
osteoarthritis.

## 2023-07-01 ENCOUNTER — Encounter: Payer: Self-pay | Admitting: Nurse Practitioner

## 2024-01-06 ENCOUNTER — Ambulatory Visit: Payer: Self-pay

## 2024-01-06 NOTE — Telephone Encounter (Signed)
 FYI Only or Action Required?: FYI only for provider.  Patient was last seen in primary care on 02/25/2023 by Craig Melanie DASEN, NP.  Called Nurse Triage reporting Urinary Frequency and Abdominal Pain.  Symptoms began several days ago.  Interventions attempted: Rest, hydration, or home remedies.  Symptoms are: unchanged.  Triage Disposition: See Physician Within 24 Hours  Patient/caregiver understands and will follow disposition?: Yes     Copied from CRM [id number]. Topic: Clinical - Red Word Triage >> Jan 06, 2024 10:06 AM Larissa RAMAN wrote: [facility] that prompted transfer to Nurse Triage: frequent urination, abdominal pain   ----------------------------------------------------------------------- From previous Reason for Contact - Scheduling: Patient/patient representative is calling to schedule an appointment. Refer to attachments for appointment information. Reason for Disposition  All other males with painful urination  Answer Assessment - Initial Assessment Questions 1. SEVERITY: How bad is the pain?  (e.g., Scale 1-10; mild, moderate, or severe)     Burning and itching  2. FREQUENCY: How many times have you had painful urination today?      Increased  3. PATTERN: Is pain present every time you urinate or just sometimes?      intermittent 4. ONSET: When did the painful urination start?      few 5. FEVER: Do you have a fever? If Yes, ask: What is your temperature, how was it measured, and when did it start?     denies 6. PAST UTI: Have you had a urine infection before? If Yes, ask: When was the last time? and What happened that time?       7. CAUSE: What do you think is causing the painful urination?      Unsure  8. OTHER SYMPTOMS: Do you have any other symptoms? (e.g., flank pain, penis discharge, scrotal pain, blood in urine)     Infrequent BM-last was 01/05/24. Mild 2/10 intermittent abdominal pain.  Protocols used: Urination Pain - Male-A-AH

## 2024-01-07 ENCOUNTER — Ambulatory Visit (INDEPENDENT_AMBULATORY_CARE_PROVIDER_SITE_OTHER): Admitting: Physician Assistant

## 2024-01-07 ENCOUNTER — Encounter: Payer: Self-pay | Admitting: Physician Assistant

## 2024-01-07 VITALS — BP 124/80 | HR 70 | Temp 98.6°F | Ht 67.0 in | Wt 203.0 lb

## 2024-01-07 DIAGNOSIS — N401 Enlarged prostate with lower urinary tract symptoms: Secondary | ICD-10-CM | POA: Diagnosis not present

## 2024-01-07 DIAGNOSIS — R300 Dysuria: Secondary | ICD-10-CM | POA: Diagnosis not present

## 2024-01-07 DIAGNOSIS — R350 Frequency of micturition: Secondary | ICD-10-CM

## 2024-01-07 LAB — POCT URINALYSIS DIPSTICK
Bilirubin, UA: NEGATIVE
Blood, UA: NEGATIVE
Glucose, UA: NEGATIVE
Ketones, UA: NEGATIVE
Leukocytes, UA: NEGATIVE
Nitrite, UA: NEGATIVE
Protein, UA: NEGATIVE
Spec Grav, UA: 1.01 (ref 1.010–1.025)
Urobilinogen, UA: 0.2 U/dL
pH, UA: 6 (ref 5.0–8.0)

## 2024-01-07 MED ORDER — TAMSULOSIN HCL 0.4 MG PO CAPS: 0.4000 mg | ORAL_CAPSULE | Freq: Every day | ORAL | 0 refills | Status: AC

## 2024-01-07 NOTE — Progress Notes (Signed)
 Date:  01/07/2024   Name:  Craig Finley   DOB:  02/06/1961   MRN:  [id number]   Chief Complaint: Dysuria (X 2 weeks, on and off, went to urgent 2 weeks ago)  Dysuria     Tonna presents new to our clinic today for acute evaluation of dysuria with vague left pelvic discomfort/pain for the last two weeks. No penile discharge, no blood, no hx kidney stones. 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 Also has hx BPH. Craig Finley actually had resolution of symptoms as of yesterday, but figured Craig Finley would still keep the appointment for additional reassurance.   Craig Finley was seen at [facility] urgent care 12/26/2023 and presumptively diagnosed with bacterial urethritis even though his urine dipstick was negative.  GC/chlamydia also negative.  Craig Finley was treated with IM ceftriaxone and PO doxycycline for 10 days.  Medication list has been reviewed and updated.  Current Meds  Medication Sig   albuterol  (VENTOLIN  HFA) 108 (90 Base) MCG/ACT inhaler Inhale 2 puffs into the lungs every 6 (six) hours as needed for wheezing or shortness of breath.   amLODipine  (NORVASC ) 10 MG tablet Take 1 tablet (10 mg total) by mouth daily.   Fexofenadine -Pseudoephedrine (ALLEGRA -D PO) Take 1 tablet by mouth daily as needed (allergies).   tadalafil  (CIALIS ) 10 MG tablet Take 1 tablet (10 mg total) by mouth every other day as needed for erectile dysfunction.   tamsulosin (FLOMAX) 0.4 MG CAPS capsule Take 1 capsule (0.4 mg total) by mouth daily.     Review of Systems  Genitourinary:  Positive for dysuria.    Patient Active Problem List   Diagnosis Date Noted   Elevated low density lipoprotein (LDL) cholesterol level 02/21/2023   Benign prostatic hyperplasia with urinary frequency 06/11/2022   ED (erectile dysfunction) 02/19/2022   Elevated LFTs 08/02/2021   ETOH abuse 06/21/2021   Pulmonary nodules 01/07/2020   Essential hypertension 12/09/2019   Gastroesophageal reflux disease 11/04/2019   Obesity 11/04/2019     No Known Allergies  Immunization History  Administered Date(s) Administered   Influenza, Seasonal, Injecte, Preservative Fre 02/25/2023   Influenza,inj,Quad PF,6+ Mos 12/09/2019, 01/09/2021, 02/19/2022   PFIZER(Purple Top)SARS-COV-2 Vaccination 06/07/2019, 06/29/2019, 04/18/2020   Tdap 02/14/2020   Zoster Recombinant(Shingrix) 02/24/2020, 05/09/2020    Past Surgical History:  Procedure Laterality Date   ANAL FISSURE REPAIR  05/1998   some type of anal fissure procedure   ARTHOSCOPIC ROTAOR CUFF REPAIR Right 04/23/2021   Procedure: Right ARTHROSCOPIC ROTATOR CUFF REPAIR;  Surgeon: Genelle Standing, MD;  Location: [facility] OR;  Service: Orthopedics;  Laterality: Right;   COLONOSCOPY WITH PROPOFOL  N/A 01/31/2020   Procedure: COLONOSCOPY WITH PROPOFOL ;  Surgeon: Unk Corinn Skiff, MD;  Location: [facility] ENDOSCOPY;  Service: Gastroenterology;  Laterality: N/A;   ESOPHAGOGASTRODUODENOSCOPY (EGD) WITH PROPOFOL  N/A 01/31/2020   Procedure: ESOPHAGOGASTRODUODENOSCOPY (EGD) WITH PROPOFOL ;  Surgeon: Unk Corinn Skiff, MD;  Location: [facility] ENDOSCOPY;  Service: Gastroenterology;  Laterality: N/A;    Social History   Tobacco Use   Smoking status: Former    Current packs/day: 0.00    Average packs/day: 1 pack/day for 25.0 years (25.0 ttl pk-yrs)    Types: Cigarettes    Quit date: 04/18/2021    Years since quitting: 2.7   Smokeless tobacco: Former    Types: Snuff    Quit date: 04/02/2021   Tobacco comments:    I was doing snuff tobacco  Vaping Use   Vaping status: Never Used  Substance Use Topics   Alcohol use:  Yes    Comment: occasional   Drug use: Never    Family History  Problem Relation Age of Onset   Arthritis Mother    Asthma Mother    Hypertension Mother    Hearing loss Father    Alcohol abuse Sister    Cancer Sister    Depression Sister    Drug abuse Sister    Early death Sister    Heart attack Sister    Hypertension Sister    Miscarriages / Stillbirths Sister    Asthma  Sister    Depression Sister    Heart disease Maternal Uncle    Heart disease Maternal Uncle    Alcohol abuse Maternal Grandmother    Cancer Maternal Grandmother    Cancer Maternal Grandfather    Asthma Daughter    Asthma Son         01/07/2024    8:07 AM 02/25/2023    8:28 AM 08/12/2022    8:25 AM 07/09/2022    2:24 PM  GAD 7 : Generalized Anxiety Score  Nervous, Anxious, on Edge 0 0 0 0  Control/stop worrying 0 0 0 0  Worry too much - different things  0 0 0  Trouble relaxing  0 0 0  Restless  0 0 0  Easily annoyed or irritable  0 0 0  Afraid - awful might happen  0 0 0  Total GAD 7 Score  0 0 0  Anxiety Difficulty  Not difficult at all  Not difficult at all       01/07/2024    8:07 AM 02/25/2023    8:28 AM 08/12/2022    8:25 AM  Depression screen PHQ 2/9  Decreased Interest 0 0 0  Down, Depressed, Hopeless 0 0 0  PHQ - 2 Score 0 0 0  Altered sleeping  0 0  Tired, decreased energy  0 0  Change in appetite  0 0  Feeling bad or failure about yourself   0 0  Trouble concentrating  0 0  Moving slowly or fidgety/restless  0 0  Suicidal thoughts  0 0  PHQ-9 Score  0 0  Difficult doing work/chores  Not difficult at all Not difficult at all    BP Readings from Last 3 Encounters:  01/07/24 124/80  02/25/23 120/67  08/12/22 118/72    Wt Readings from Last 3 Encounters:  01/07/24 203 lb (92.1 kg)  02/25/23 207 lb 3.2 oz (94 kg)  08/12/22 209 lb 12.8 oz (95.2 kg)    BP 124/80   Pulse 70   Temp 98.6 F (37 C) (Oral)   Ht 5' 7 (1.702 m)   Wt 203 lb (92.1 kg)   SpO2 98%   BMI 31.79 kg/m   Physical Exam Vitals and nursing note reviewed.  Constitutional:      Appearance: Normal appearance.  Cardiovascular:     Rate and Rhythm: Normal rate.  Pulmonary:     Effort: Pulmonary effort is normal.  Abdominal:     General: There is no distension.     Tenderness: There is no abdominal tenderness. There is no right CVA tenderness or left CVA tenderness.   Musculoskeletal:        General: Normal range of motion.  Skin:    General: Skin is warm and dry.  Neurological:     Mental Status: Craig Finley is alert and oriented to person, place, and time.     Gait: Gait is intact.  Psychiatric:  Mood and Affect: Mood and affect normal.    Lab Results  Component Value Date   COLORU yellow 01/07/2024   CLARITYU clear 01/07/2024   GLUCOSEUR Negative 01/07/2024   BILIRUBINUR neg 01/07/2024   KETONESU neg 01/07/2024   SPECGRAV 1.010 01/07/2024   RBCUR neg 01/07/2024   PHUR 6.0 01/07/2024   PROTEINUR Negative 01/07/2024   UROBILINOGEN 0.2 01/07/2024   LEUKOCYTESUR Negative 01/07/2024     Recent Labs     Component Value Date/Time   NA 140 02/25/2023 0902   K 4.2 02/25/2023 0902   CL 102 02/25/2023 0902   CO2 26 02/25/2023 0902   GLUCOSE 98 02/25/2023 0902   GLUCOSE 111 (H) 04/23/2021 1012   BUN 10 02/25/2023 0902   CREATININE 1.22 02/25/2023 0902   CALCIUM 9.5 02/25/2023 0902   PROT 6.8 02/25/2023 0902   ALBUMIN 4.1 02/25/2023 0902   AST 23 02/25/2023 0902   ALT 20 02/25/2023 0902   ALKPHOS 63 02/25/2023 0902   BILITOT 0.6 02/25/2023 0902   GFRNONAA >60 04/23/2021 1012   GFRAA >60 11/03/2019 1608    Lab Results  Component Value Date   WBC 4.9 02/25/2023   HGB 15.7 02/25/2023   HCT 46.9 02/25/2023   MCV 88 02/25/2023   PLT 216 02/25/2023   Lab Results  Component Value Date   HGBA1C 5.5 11/18/2019   Lab Results  Component Value Date   CHOL 203 (H) 02/25/2023   HDL 60 02/25/2023   LDLCALC 124 (H) 02/25/2023   TRIG 107 02/25/2023   CHOLHDL 3.8 01/25/2021   Lab Results  Component Value Date   TSH 2.880 02/25/2023      Assessment and Plan:  1. Dysuria (Primary) Urine dipstick clear again today. Symptoms resolved. No further intervention necessary.  - POCT Urinalysis Dipstick  2. Benign prostatic hyperplasia with urinary frequency Considered the possibility that this might have been a mild/smoldering  prostatitis or partial outflow obstruction from his BPH.  Prescribing tamsulosin for him to try as needed in the future.  Advise caution for hypotension when using with PDE 5 inhibitors; Craig Finley verbalizes understanding.  - tamsulosin (FLOMAX) 0.4 MG CAPS capsule; Take 1 capsule (0.4 mg total) by mouth daily.  Dispense: 30 capsule; Refill: 0     Follow up with PCP. Would suggest PSA and prostate exam in Dec 2025 (last PSA was Dec 2024).   Rolan Hoyle, PA-C, DMSc, Nutritionist [facility] [phone]

## 2024-03-03 ENCOUNTER — Other Ambulatory Visit: Payer: Self-pay | Admitting: Nurse Practitioner

## 2024-03-04 NOTE — Telephone Encounter (Signed)
 Unable to refill per protocol, appointment needed.   Requested Prescriptions  Pending Prescriptions Disp Refills   amLODipine  (NORVASC ) 10 MG tablet [Pharmacy Med Name: amLODIPine  Besylate 10 MG Oral Tablet] 90 tablet 0    Sig: Take 1 tablet by mouth once daily     Cardiovascular: Calcium Channel Blockers 2 Failed - 03/04/2024  4:21 PM      Failed - Valid encounter within last 6 months    Recent Outpatient Visits           1 month ago Dysuria   [facility] at [facility], [place], [place]              Passed - Last BP in normal range    BP Readings from Last 1 Encounters:  01/07/24 124/80         Passed - Last Heart Rate in normal range    Pulse Readings from Last 1 Encounters:  01/07/24 70
